# Patient Record
Sex: Male | Born: 1975 | Race: White | Hispanic: No | Marital: Single | Smoking: Former smoker
Health system: Southern US, Community
[De-identification: ages and names within clinical notes are randomized; demographics above are authoritative.]

## PROBLEM LIST (undated history)

## (undated) DIAGNOSIS — M869 Osteomyelitis, unspecified: Secondary | ICD-10-CM

## (undated) DIAGNOSIS — G4730 Sleep apnea, unspecified: Secondary | ICD-10-CM

## (undated) DIAGNOSIS — F17209 Nicotine dependence, unspecified, with unspecified nicotine-induced disorders: Secondary | ICD-10-CM

## (undated) DIAGNOSIS — E119 Type 2 diabetes mellitus without complications: Secondary | ICD-10-CM

## (undated) DIAGNOSIS — F319 Bipolar disorder, unspecified: Secondary | ICD-10-CM

## (undated) DIAGNOSIS — F324 Major depressive disorder, single episode, in partial remission: Secondary | ICD-10-CM

## (undated) DIAGNOSIS — I10 Essential (primary) hypertension: Secondary | ICD-10-CM

## (undated) DIAGNOSIS — L97419 Non-pressure chronic ulcer of right heel and midfoot with unspecified severity: Secondary | ICD-10-CM

## (undated) DIAGNOSIS — F419 Anxiety disorder, unspecified: Secondary | ICD-10-CM

## (undated) DIAGNOSIS — J439 Emphysema, unspecified: Secondary | ICD-10-CM

## (undated) DIAGNOSIS — E1142 Type 2 diabetes mellitus with diabetic polyneuropathy: Secondary | ICD-10-CM

---

## 2008-06-18 HISTORY — PX: DG GREAT TOE LEFT FOOT: HXRAD1656

## 2012-09-18 ENCOUNTER — Ambulatory Visit: Payer: Self-pay

## 2012-10-16 ENCOUNTER — Ambulatory Visit: Payer: Self-pay

## 2012-11-16 ENCOUNTER — Ambulatory Visit: Payer: Self-pay

## 2014-05-25 ENCOUNTER — Ambulatory Visit: Payer: Self-pay

## 2014-05-25 IMAGING — NM NUCLEAR MEDICINE WHOLE BODY BONE SCINTIGRAPHY
4 series · 14 of 14 positions shown · non-contrast
Comparison: None.

CLINICAL DATA: Hypercalcemia.

EXAM:
NUCLEAR MEDICINE WHOLE BODY BONE SCAN
TECHNIQUE: Whole body anterior and posterior images were obtained approximately
3 hours after intravenous injection of radiopharmaceutical.
RADIOPHARMACEUTICALS:  23.009 mCi [70] MDP

[Series 1000: statics · 2.40mm/px · 2 of 2 frames shown (1 of 2)]
[frame 1/2]
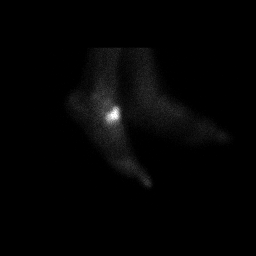
[frame 2/2]
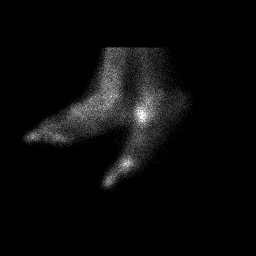

[Series 1000: 3 hr wholebody #1 head-mid femur · 2.40mm/px · 2 of 2 frames shown]
[frame 1/2]
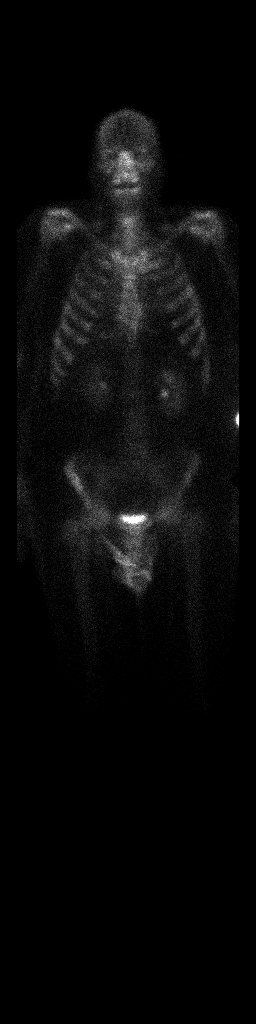
[frame 2/2]
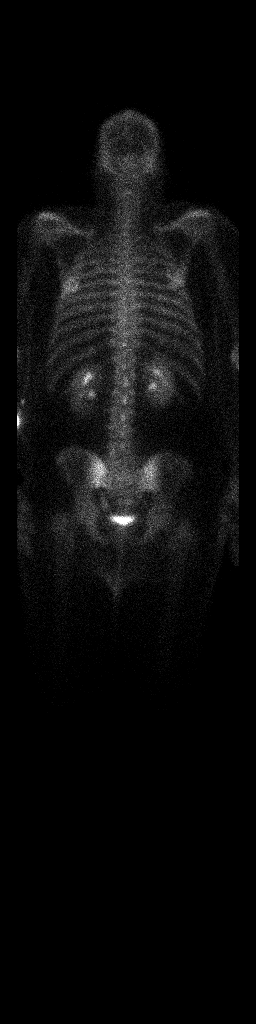

[Series 1000: statics · 2.40mm/px · 4 acquisitions, 8 frames shown (2 of 2)]
[im 1/4]
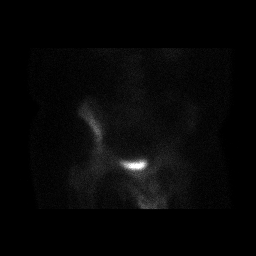
[im 1/4]
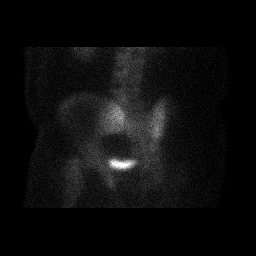
[im 2/4]
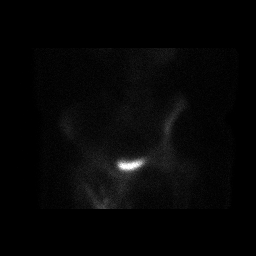
[im 2/4]
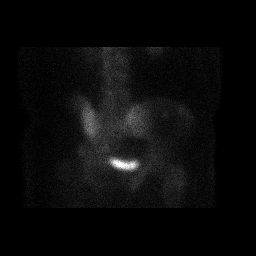
[im 3/4]
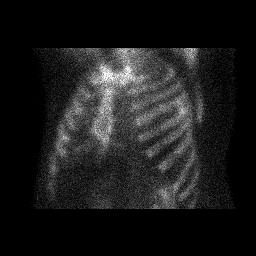
[im 3/4]
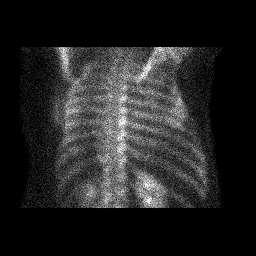
[im 4/4]
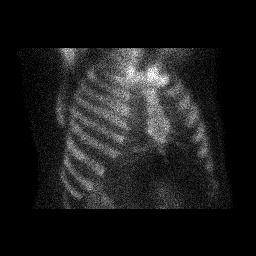
[im 4/4]
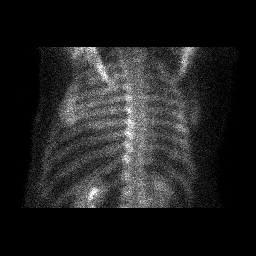

[Series 1000: 3 hr wholebody-#2 femur-feet · 2.40mm/px · 2 of 2 frames shown]
[frame 1/2]
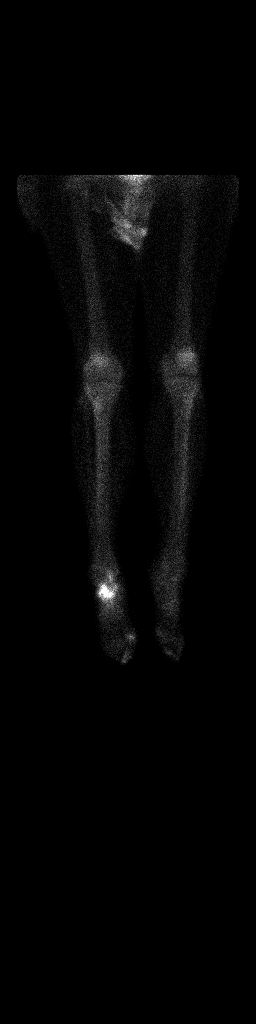
[frame 2/2]
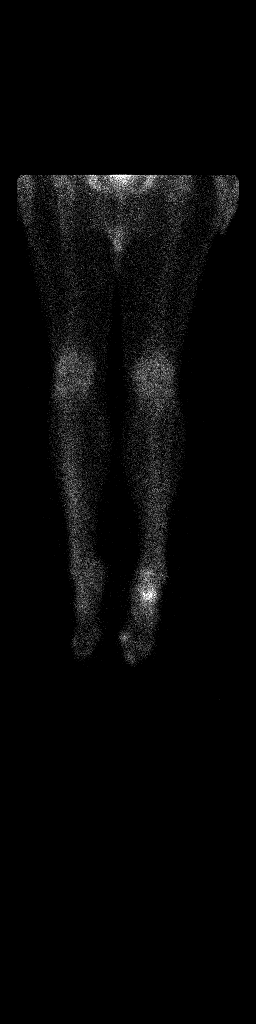

[14 of 14 positions shown; findings below may reference images not displayed]

FINDINGS: Large area of abnormal uptake is seen involving the lateral aspect
of the right midfoot. It is uncertain if this represents
degenerative change, or possibly acute abnormality such as
osteomyelitis. Small focus of abnormal uptake is also seen in the
region of the right first metatarsophalangeal joint. This most
likely represents degenerative change. No other areas of abnormal
uptake her noted.
IMPRESSION: Small focus of abnormal uptake is seen in the region of the right
first metatarsophalangeal joint most consistent with degenerative
change.

Larger area of abnormal uptake is seen involving the lateral aspect
of the right midfoot which may represent severe degenerative change,
but acute abnormality such as osteomyelitis cannot be excluded.
Clinical correlation and possibly MRI may be performed for further
evaluation.

## 2015-09-13 DIAGNOSIS — E6609 Other obesity due to excess calories: Secondary | ICD-10-CM | POA: Insufficient documentation

## 2015-09-13 DIAGNOSIS — E291 Testicular hypofunction: Secondary | ICD-10-CM | POA: Insufficient documentation

## 2016-04-06 DIAGNOSIS — E1142 Type 2 diabetes mellitus with diabetic polyneuropathy: Secondary | ICD-10-CM | POA: Insufficient documentation

## 2017-09-12 ENCOUNTER — Encounter: Payer: Self-pay | Admitting: Dietician

## 2017-09-12 ENCOUNTER — Encounter: Payer: Medicare Other | Attending: Internal Medicine | Admitting: Dietician

## 2017-09-12 VITALS — Ht 79.0 in | Wt 322.6 lb

## 2017-09-12 DIAGNOSIS — Z713 Dietary counseling and surveillance: Secondary | ICD-10-CM | POA: Diagnosis present

## 2017-09-12 DIAGNOSIS — E119 Type 2 diabetes mellitus without complications: Secondary | ICD-10-CM | POA: Diagnosis not present

## 2017-09-12 DIAGNOSIS — F419 Anxiety disorder, unspecified: Secondary | ICD-10-CM | POA: Insufficient documentation

## 2017-09-12 DIAGNOSIS — IMO0001 Reserved for inherently not codable concepts without codable children: Secondary | ICD-10-CM | POA: Insufficient documentation

## 2017-09-12 DIAGNOSIS — E7800 Pure hypercholesterolemia, unspecified: Secondary | ICD-10-CM | POA: Insufficient documentation

## 2017-09-12 DIAGNOSIS — I10 Essential (primary) hypertension: Secondary | ICD-10-CM | POA: Insufficient documentation

## 2017-09-12 DIAGNOSIS — F17200 Nicotine dependence, unspecified, uncomplicated: Secondary | ICD-10-CM | POA: Insufficient documentation

## 2017-09-12 DIAGNOSIS — F319 Bipolar disorder, unspecified: Secondary | ICD-10-CM | POA: Insufficient documentation

## 2017-09-12 DIAGNOSIS — Z6836 Body mass index (BMI) 36.0-36.9, adult: Secondary | ICD-10-CM

## 2017-09-12 DIAGNOSIS — F5101 Primary insomnia: Secondary | ICD-10-CM | POA: Insufficient documentation

## 2017-09-12 DIAGNOSIS — E559 Vitamin D deficiency, unspecified: Secondary | ICD-10-CM | POA: Insufficient documentation

## 2017-09-12 DIAGNOSIS — E669 Obesity, unspecified: Secondary | ICD-10-CM | POA: Diagnosis present

## 2017-09-12 DIAGNOSIS — Z794 Long term (current) use of insulin: Secondary | ICD-10-CM

## 2017-09-12 DIAGNOSIS — F3341 Major depressive disorder, recurrent, in partial remission: Secondary | ICD-10-CM | POA: Insufficient documentation

## 2017-09-12 DIAGNOSIS — E118 Type 2 diabetes mellitus with unspecified complications: Secondary | ICD-10-CM

## 2017-09-12 NOTE — Progress Notes (Signed)
Medical Nutrition Therapy: Visit start time: 1500  end time: 1545  Assessment:  Diagnosis: obesity, diabetes, HTN Past medical history: sleep apnea Psychosocial issues/ stress concerns: depression, bipolar disorder, chronic insomnia Preferred learning method:  . Auditory . Visual  Current weight: 322.6lbs  Height: 6'7" Medications, supplements: reconciled list in medical record  Progress and evaluation: BG testing before meals, reports HbA1C 7.5%, has been improving recently; Novolog dose recently increased before meals. He is not carb counting, Novolog dose is based on BG results. No recent diet changes, feels like having an updated diet plan will help him resume healthier habits. He does want to lose weight and begin regular exercise. Reports erratic eating schedule, some days sleeping through morning hours and eating late afternoon and nighttime meals.   Physical activity: no regular exercise; has gym membership but has not yet started.   Dietary Intake:  Usual eating pattern includes 2 meals and 0-1 snacks per day. Dining out frequency: 2-4 meals per week.  Breakfast: none Snack: none Lunch: 4-5pm depends on food availability; usually cooks meat, starch vegetables, or sometimes sandwich. 3/27-- frozen fish and chips; TGIF mozzarella sticks (not typical)  Snack: none Supper: 11pm; occasionally into next morning hours-- "close do dawn"; sometimes cooks, or leftovers from lunch. Last week made minestrone soup Snack: usually none Beverages: water, crystal light, occasionally coffee with sugar free creamer.  Nutrition Care Education: Topics covered: weight control, diabetes, hypertension Basic nutrition: basic food groups, appropriate nutrient balance, appropriate meal and snack schedule, general nutrition guidelines    Weight control: determined energy needs for weight loss at 1900-2000kcal daily; provided meal plan with 45%CHO, 25% protein, and 30% fat; discussed appropriate food  portions, benefit of eating at regular intervals, importance of low fat food choices, role of exercise on BG control and weight-- encouraged beginning with short duration and increasing as able. Diabetes: appropriate meal and snack schedule, appropriate carb intake and balance, with 45% CHO for patient's energy needs, this amounts to 4 servings or 60grams with each meal, based on 3 meals daily; advised high fiber, whole grain choices whenever possible; advised eating every 4-5 hours for BG and weight control; used plate method for meal planning instruction. Hypertension: identifying high sodium foods, identifying food sources of potassium, magnesium-- encouraged plenty of vegetables and fruits for both BP control and weight control, and to help with portion control of starchy foods.  Nutritional Diagnosis:  [place]-2.2 Altered nutrition-related laboratory As related to diabetes.  As evidenced by HbA1C of 7.5%, patient report. [place]-3.3 Overweight/obesity As related to excess calories and inactivity, medications.  As evidenced by BMI 36, patient report of large food portions, insomnia, no regular exercise.  Intervention: Instruction as noted above.   Set goals with direction from patient.   He is motivated to work on lifestyle changes.    No follow-up scheduled at this time, patient will schedule later as needed.  Education Materials given:  . Plate Planner with food lists . Sample meal pattern/ menus  Planning a Balanced Meal . Diabetes-Friendly Recipes . Goals/ instructions  Learner/ who was taught:  . Patient   Level of understanding: Marland Kitchen. Verbalizes/ demonstrates competency  Demonstrated degree of understanding via:   Teach back Learning barriers: . None  Willingness to learn/ readiness for change: . Acceptance, ready for change  Monitoring and Evaluation:  Dietary intake, exercise, BG control, BP control, and body weight      follow up: prn

## 2017-09-12 NOTE — Patient Instructions (Signed)
   Monitor portion control for foods at mealtimes, aim for about 4 servings or 60grams carbohydrate at each meal, + a protein food + low-carb veggies.   Work on a regular eating schedule: Eat a meal or snack every 4-5 hours while awake. Eat light meals/ snacks if eating during nighttime hours.   Begin some regular exercise, start with short periods of time such as 10-15 minutes, and increase as energy increases.

## 2021-05-26 ENCOUNTER — Inpatient Hospital Stay
Admission: EM | Admit: 2021-05-26 | Discharge: 2021-06-01 | DRG: 074 | Disposition: A | Discharge status: Rehabilitation Facility | Payer: Medicare Other | Attending: Internal Medicine | Admitting: Internal Medicine

## 2021-05-26 ENCOUNTER — Other Ambulatory Visit: Payer: Self-pay

## 2021-05-26 ENCOUNTER — Inpatient Hospital Stay: Payer: Medicare Other

## 2021-05-26 ENCOUNTER — Encounter: Payer: Self-pay | Admitting: Emergency Medicine

## 2021-05-26 DIAGNOSIS — E785 Hyperlipidemia, unspecified: Secondary | ICD-10-CM | POA: Diagnosis present

## 2021-05-26 DIAGNOSIS — E1142 Type 2 diabetes mellitus with diabetic polyneuropathy: Principal | ICD-10-CM | POA: Diagnosis present

## 2021-05-26 DIAGNOSIS — R2681 Unsteadiness on feet: Secondary | ICD-10-CM | POA: Diagnosis present

## 2021-05-26 DIAGNOSIS — Z794 Long term (current) use of insulin: Secondary | ICD-10-CM | POA: Diagnosis not present

## 2021-05-26 DIAGNOSIS — Z20822 Contact with and (suspected) exposure to covid-19: Secondary | ICD-10-CM | POA: Diagnosis present

## 2021-05-26 DIAGNOSIS — M21372 Foot drop, left foot: Secondary | ICD-10-CM | POA: Diagnosis present

## 2021-05-26 DIAGNOSIS — F3341 Major depressive disorder, recurrent, in partial remission: Secondary | ICD-10-CM | POA: Diagnosis present

## 2021-05-26 DIAGNOSIS — Y92009 Unspecified place in unspecified non-institutional (private) residence as the place of occurrence of the external cause: Secondary | ICD-10-CM | POA: Diagnosis not present

## 2021-05-26 DIAGNOSIS — Z79899 Other long term (current) drug therapy: Secondary | ICD-10-CM

## 2021-05-26 DIAGNOSIS — Z7984 Long term (current) use of oral hypoglycemic drugs: Secondary | ICD-10-CM

## 2021-05-26 DIAGNOSIS — Z6838 Body mass index (BMI) 38.0-38.9, adult: Secondary | ICD-10-CM

## 2021-05-26 DIAGNOSIS — W19XXXA Unspecified fall, initial encounter: Secondary | ICD-10-CM

## 2021-05-26 DIAGNOSIS — M48061 Spinal stenosis, lumbar region without neurogenic claudication: Secondary | ICD-10-CM | POA: Diagnosis present

## 2021-05-26 DIAGNOSIS — E1165 Type 2 diabetes mellitus with hyperglycemia: Secondary | ICD-10-CM | POA: Diagnosis present

## 2021-05-26 DIAGNOSIS — Z7982 Long term (current) use of aspirin: Secondary | ICD-10-CM

## 2021-05-26 DIAGNOSIS — F319 Bipolar disorder, unspecified: Secondary | ICD-10-CM | POA: Diagnosis present

## 2021-05-26 DIAGNOSIS — Z7985 Long-term (current) use of injectable non-insulin antidiabetic drugs: Secondary | ICD-10-CM | POA: Diagnosis not present

## 2021-05-26 DIAGNOSIS — R29898 Other symptoms and signs involving the musculoskeletal system: Secondary | ICD-10-CM | POA: Diagnosis present

## 2021-05-26 DIAGNOSIS — M21371 Foot drop, right foot: Secondary | ICD-10-CM | POA: Diagnosis present

## 2021-05-26 DIAGNOSIS — I10 Essential (primary) hypertension: Secondary | ICD-10-CM | POA: Diagnosis present

## 2021-05-26 DIAGNOSIS — E1149 Type 2 diabetes mellitus with other diabetic neurological complication: Secondary | ICD-10-CM

## 2021-05-26 DIAGNOSIS — Z87891 Personal history of nicotine dependence: Secondary | ICD-10-CM | POA: Diagnosis not present

## 2021-05-26 DIAGNOSIS — E669 Obesity, unspecified: Secondary | ICD-10-CM | POA: Diagnosis present

## 2021-05-26 HISTORY — DX: Non-pressure chronic ulcer of right heel and midfoot with unspecified severity: L97.419

## 2021-05-26 HISTORY — DX: Sleep apnea, unspecified: G47.30

## 2021-05-26 HISTORY — DX: Type 2 diabetes mellitus with diabetic polyneuropathy: E11.42

## 2021-05-26 HISTORY — DX: Morbid (severe) obesity due to excess calories: E66.01

## 2021-05-26 HISTORY — DX: Anxiety disorder, unspecified: F41.9

## 2021-05-26 HISTORY — DX: Osteomyelitis, unspecified: M86.9

## 2021-05-26 HISTORY — DX: Emphysema, unspecified: J43.9

## 2021-05-26 HISTORY — DX: Essential (primary) hypertension: I10

## 2021-05-26 HISTORY — DX: Bipolar disorder, unspecified: F31.9

## 2021-05-26 HISTORY — DX: Type 2 diabetes mellitus without complications: E11.9

## 2021-05-26 HISTORY — DX: Major depressive disorder, single episode, in partial remission: F32.4

## 2021-05-26 HISTORY — DX: Nicotine dependence, unspecified, with unspecified nicotine-induced disorders: F17.209

## 2021-05-26 LAB — URINALYSIS, ROUTINE W REFLEX MICROSCOPIC
Bilirubin Urine: NEGATIVE
Glucose, UA: NEGATIVE mg/dL
Hgb urine dipstick: NEGATIVE
Ketones, ur: NEGATIVE mg/dL
Leukocytes,Ua: NEGATIVE
Nitrite: NEGATIVE
Protein, ur: NEGATIVE mg/dL
Specific Gravity, Urine: 1.025 (ref 1.005–1.030)
pH: 6 (ref 5.0–8.0)

## 2021-05-26 LAB — BASIC METABOLIC PANEL
Anion gap: 7 (ref 5–15)
BUN: 17 mg/dL (ref 6–20)
CO2: 23 mmol/L (ref 22–32)
Calcium: 10.2 mg/dL (ref 8.9–10.3)
Chloride: 108 mmol/L (ref 98–111)
Creatinine, Ser: 1.04 mg/dL (ref 0.61–1.24)
GFR, Estimated: >60 mL/min (ref >60)
Glucose, Bld: 170 mg/dL — ABNORMAL HIGH (ref 70–99)
Potassium: 3.5 mmol/L (ref 3.5–5.1)
Sodium: 138 mmol/L (ref 135–145)

## 2021-05-26 LAB — RESP PANEL BY RT-PCR (FLU A&B, COVID) ARPGX2
Influenza A by PCR: NEGATIVE
Influenza B by PCR: NEGATIVE
SARS Coronavirus 2 by RT PCR: NEGATIVE

## 2021-05-26 LAB — CSF CELL COUNT WITH DIFFERENTIAL
Eosinophils, CSF: 1 %
Lymphs, CSF: 22 %
Monocyte-Macrophage-Spinal Fluid: 0 %
Other Cells, CSF: 0
RBC Count, CSF: 4053 /mm3 — ABNORMAL HIGH (ref 0–3)
Segmented Neutrophils-CSF: 77 %
Tube #: 1
WBC, CSF: 13 /mm3 (ref 0–5)

## 2021-05-26 LAB — CBC
HCT: 42.2 % (ref 39.0–52.0)
Hemoglobin: 13.6 g/dL (ref 13.0–17.0)
MCH: 27.6 pg (ref 26.0–34.0)
MCHC: 32.2 g/dL (ref 30.0–36.0)
MCV: 85.8 fL (ref 80.0–100.0)
Platelets: 328 10*3/uL (ref 150–400)
RBC: 4.92 MIL/uL (ref 4.22–5.81)
RDW: 13.5 % (ref 11.5–15.5)
WBC: 9.6 10*3/uL (ref 4.0–10.5)
nRBC: 0 % (ref 0.0–0.2)

## 2021-05-26 LAB — PROTEIN, CSF: Total  Protein, CSF: 287 mg/dL — ABNORMAL HIGH (ref 15–45)

## 2021-05-26 LAB — CBG MONITORING, ED
Glucose-Capillary: 142 mg/dL — ABNORMAL HIGH (ref 70–99)
Glucose-Capillary: 282 mg/dL — ABNORMAL HIGH (ref 70–99)
Glucose-Capillary: 319 mg/dL — ABNORMAL HIGH (ref 70–99)

## 2021-05-26 LAB — GLUCOSE, CAPILLARY: Glucose-Capillary: 328 mg/dL — ABNORMAL HIGH (ref 70–99)

## 2021-05-26 LAB — GLUCOSE, CSF: Glucose, CSF: 122 mg/dL — ABNORMAL HIGH (ref 40–70)

## 2021-05-26 LAB — LITHIUM LEVEL: Lithium Lvl: 0.52 mmol/L — ABNORMAL LOW (ref 0.60–1.20)

## 2021-05-26 LAB — TROPONIN I (HIGH SENSITIVITY)
Troponin I (High Sensitivity): 37 ng/L — ABNORMAL HIGH (ref <18)
Troponin I (High Sensitivity): 36 ng/L — ABNORMAL HIGH (ref <18)

## 2021-05-26 IMAGING — MR MR CERVICAL SPINE WO/W CM
5 of 8 series · 29 of 48 positions shown · IV contrast (gadavist)
Comparison: None.

CLINICAL DATA: Neuro deficit, acute, stroke suspected.  Weakness

EXAM:
MRI CERVICAL SPINE WITHOUT AND WITH CONTRAST
TECHNIQUE: Multiplanar and multiecho pulse sequences of the cervical spine, to
include the craniocervical junction and cervicothoracic junction,
were obtained without and with intravenous contrast.
CONTRAST:  10mL GADAVIST GADOBUTROL 1 MMOL/ML IV SOLN

[Series 9: T2 · sagittal · 3.0mm · 0.62mm/px · 4 of 15 slices shown (1 of 2)]
[im 1/15]
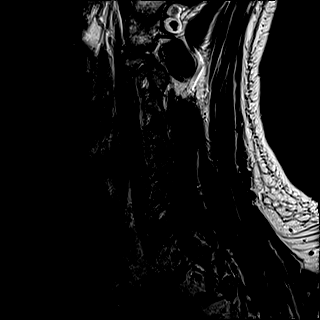
[im 5/15]
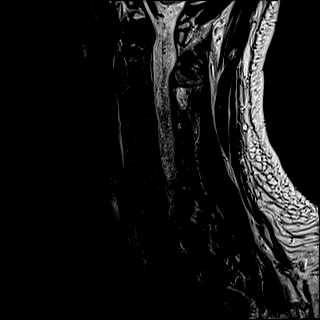
[im 10/15]
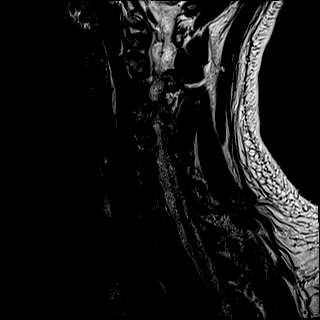
[im 15/15]
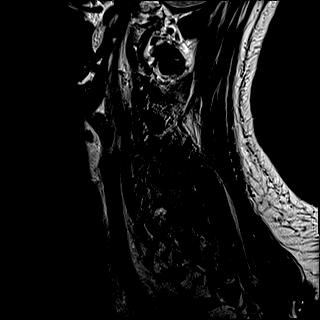

[Series 11: STIR · sagittal · 3.0mm · 0.62mm/px · 4 of 15 slices shown]
[im 1/15]
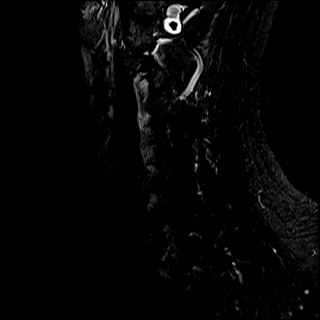
[im 5/15]
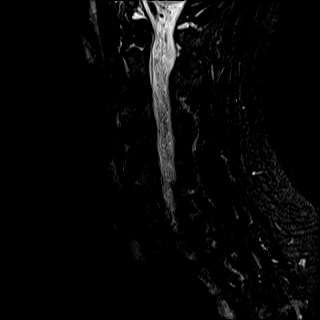
[im 10/15]
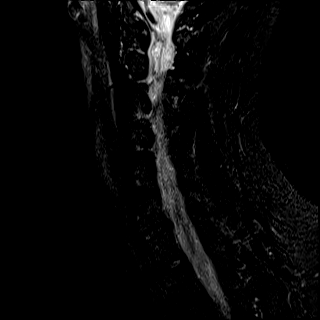
[im 15/15]
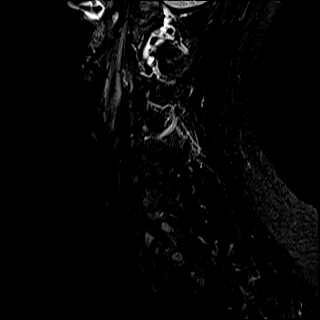

[Series 12: T2 · axial · 3.0mm · 0.70mm/px · z∈[-29,+71]mm · 8 of 31 slices shown (2 of 2)]
[im 1/31]
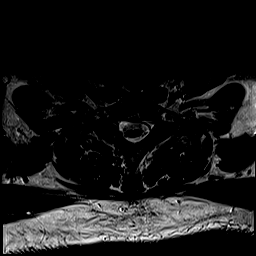
[im 5/31]
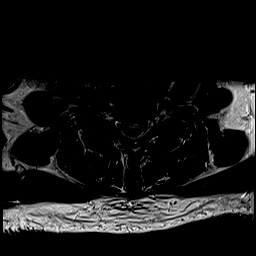
[im 9/31]
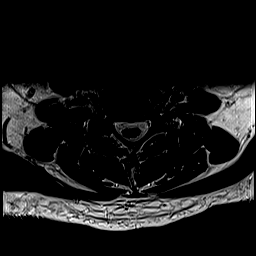
[im 13/31]
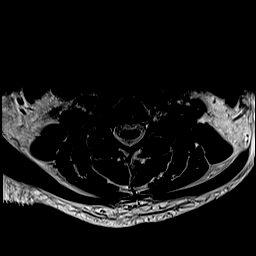
[im 18/31]
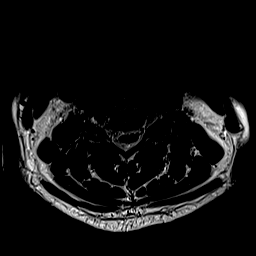
[im 22/31]
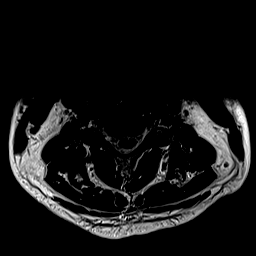
[im 26/31]
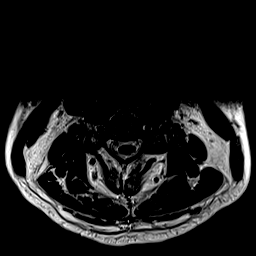
[im 31/31]
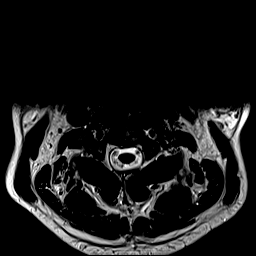

[Series 14: T1 · axial · non-contrast · 3.0mm · 0.35mm/px · z∈[-29,+71]mm · 8 of 31 slices shown]
[im 1/31]
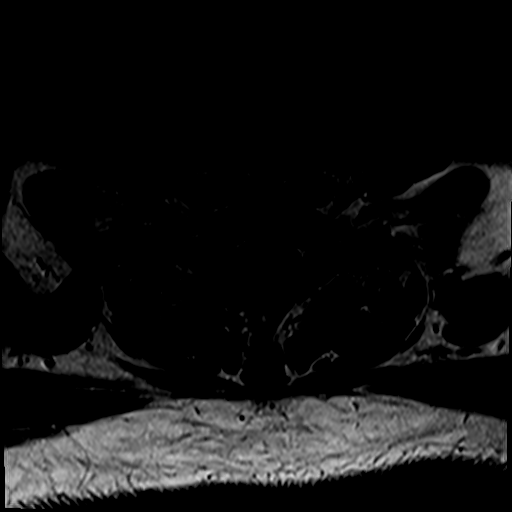
[im 5/31]
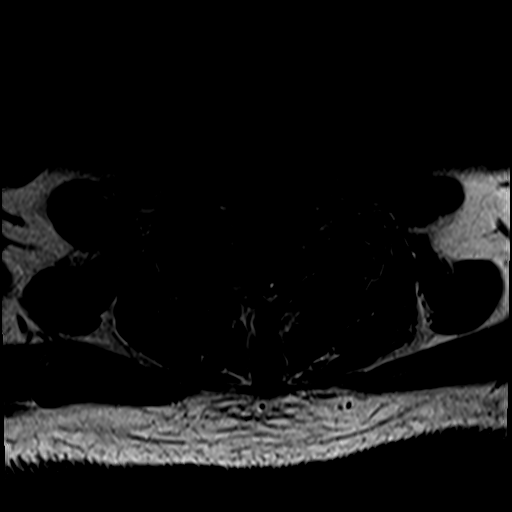
[im 9/31]
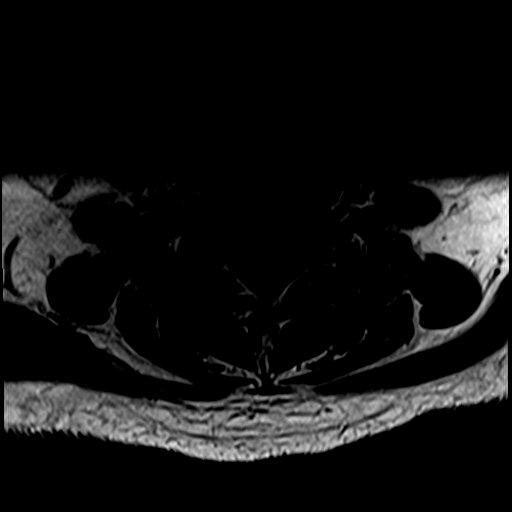
[im 13/31]
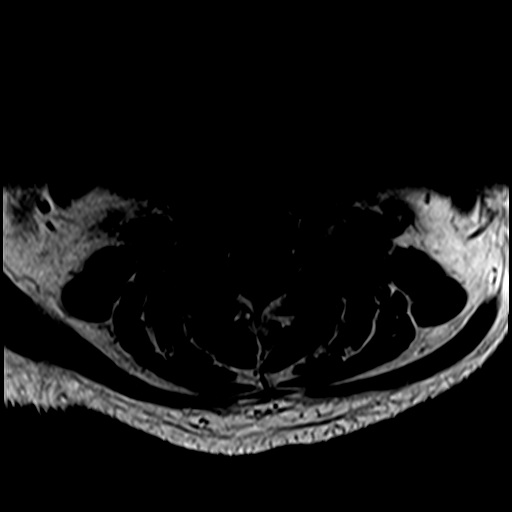
[im 18/31]
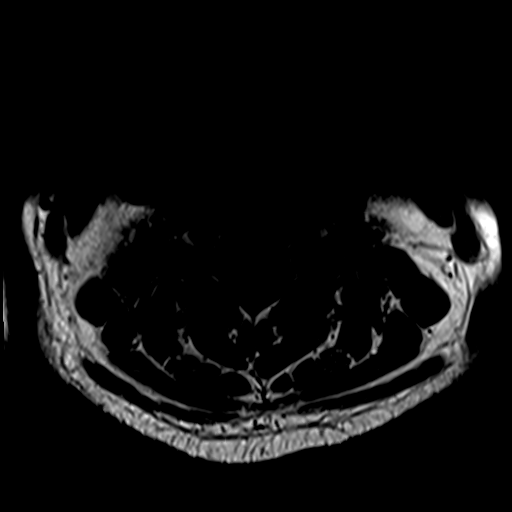
[im 22/31]
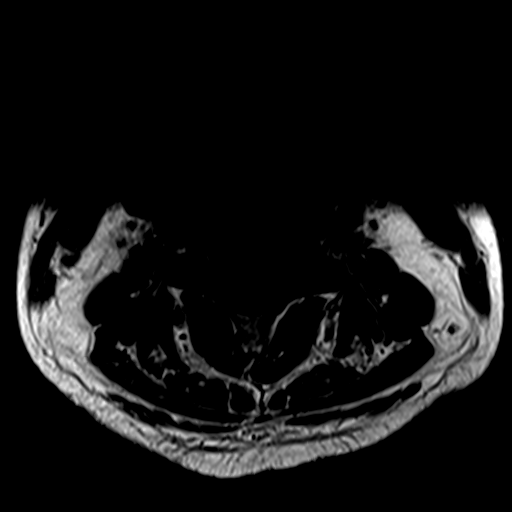
[im 26/31]
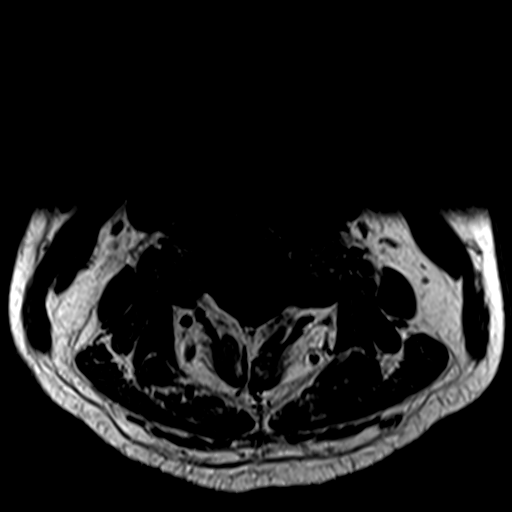
[im 31/31]
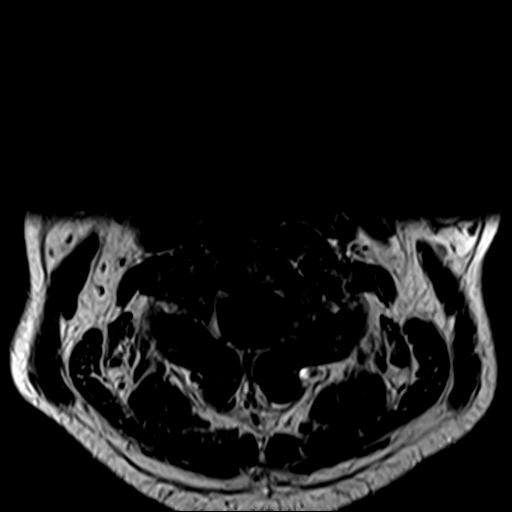

[Series 16: T1 post-contrast · axial · 3.0mm · 0.35mm/px · z∈[-29,+28]mm · 5 of 31 slices shown]
[im 1/31]
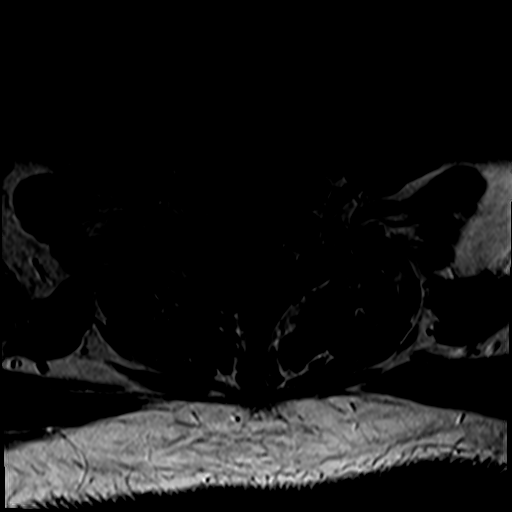
[im 5/31]
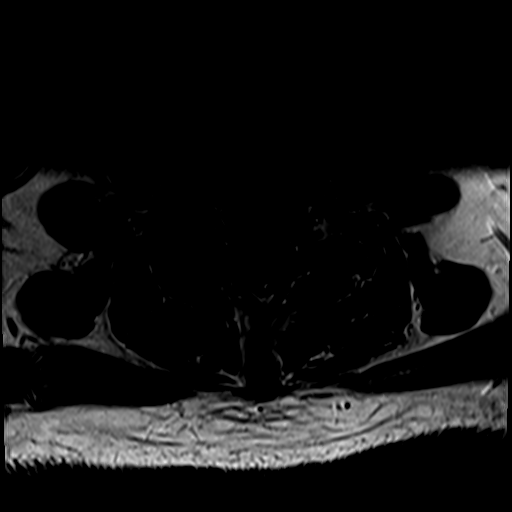
[im 9/31]
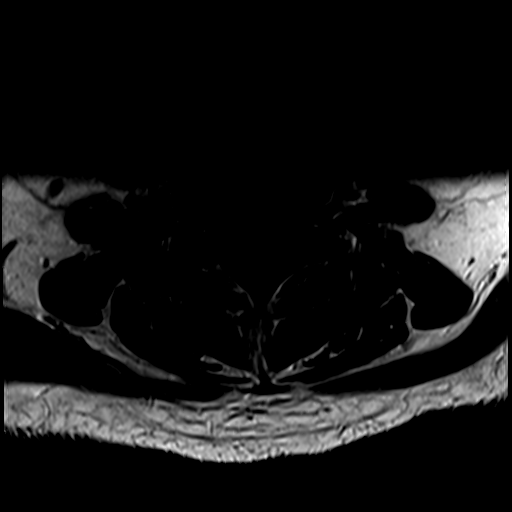
[im 13/31]
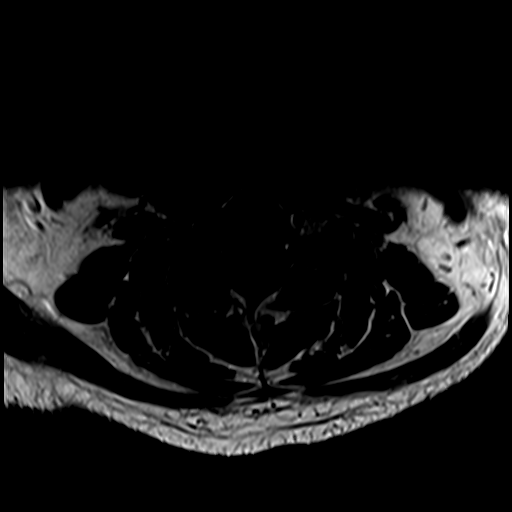
[im 18/31]
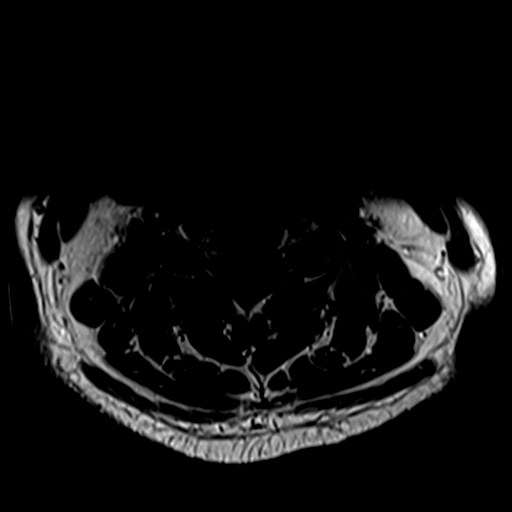

[29 of 48 positions shown; findings below may reference images not displayed]

FINDINGS: Technical Note: Despite efforts by the technologist and patient,
motion artifact is present on today's exam and could not be
eliminated. This reduces exam sensitivity and specificity.

Alignment: Physiologic.

Vertebrae: No fracture, evidence of discitis, or bone lesion.

Cord: Normal signal and morphology.

Posterior Fossa, vertebral arteries, paraspinal tissues: Negative.

Disc levels:

C2-C3: No significant disc protrusion, foraminal stenosis, or canal
stenosis.

C3-C4: Minimal disc bulge with right sided uncovertebral spurring.
No foraminal or canal stenosis.

C4-C5: No disc bulge. Minimal right uncovertebral spurring. No
foraminal or canal stenosis.

C5-C6: No disc bulge. Minimal right uncovertebral spurring. No
foraminal or canal stenosis.

C6-C7: Shallow central disc protrusion. No foraminal or canal
stenosis.

C7-T1: No significant disc protrusion, foraminal stenosis, or canal
stenosis.
IMPRESSION: 1. No acute findings.  No abnormal cord signal.
2. Early mild cervical spondylosis. No significant foraminal or
canal stenosis at any level.

## 2021-05-26 IMAGING — MR MR THORACIC SPINE WO/W CM
7 of 9 series · 31 of 48 positions shown · IV contrast (gadavist)
Comparison: None.

CLINICAL DATA: Trunk numbness or tingling weakness in both legs,
unable to walk

EXAM:
MRI THORACIC WITHOUT AND WITH CONTRAST
TECHNIQUE: Multiplanar and multiecho pulse sequences of the thoracic spine were
obtained without and with intravenous contrast.
CONTRAST:  10mL GADAVIST GADOBUTROL 1 MMOL/ML IV SOLN

[Series 18: T1 · sagittal · 6.0mm · 1.88mm/px · 1 of 8 slices shown (1 of 2)]
[im 1/8]
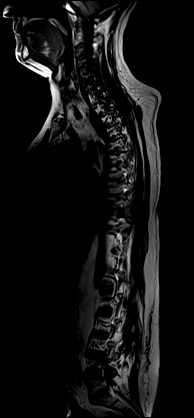

[Series 19: T2 · sagittal · 3.0mm · 1.33mm/px · 3 of 17 slices shown (1 of 2)]
[im 1/17]
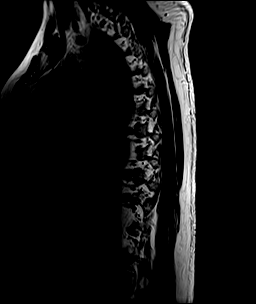
[im 9/17]
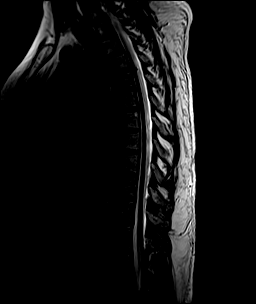
[im 17/17]
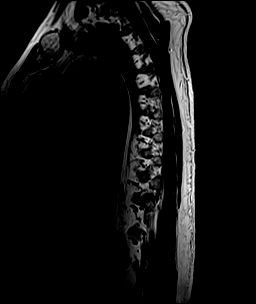

[Series 20: T1 · sagittal · 3.0mm · 1.33mm/px · 4 of 17 slices shown (2 of 2)]
[im 1/17]
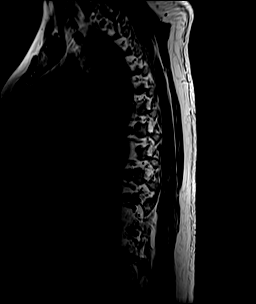
[im 6/17]
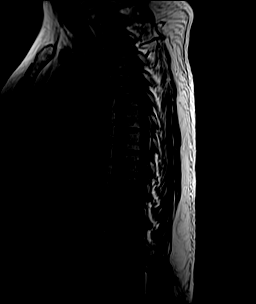
[im 11/17]
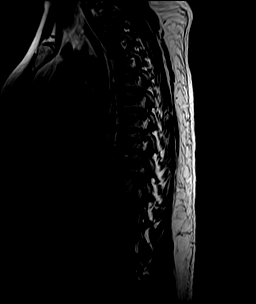
[im 17/17]
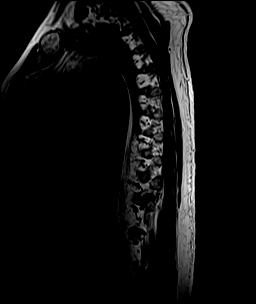

[Series 21: STIR · sagittal · 3.0mm · 0.66mm/px · 4 of 17 slices shown]
[im 1/17]
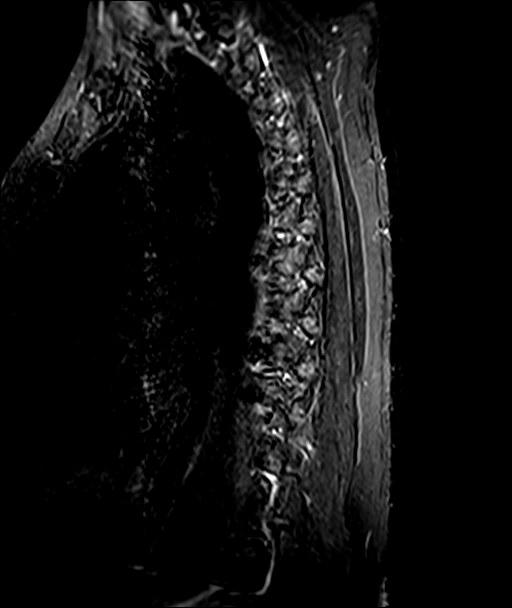
[im 6/17]
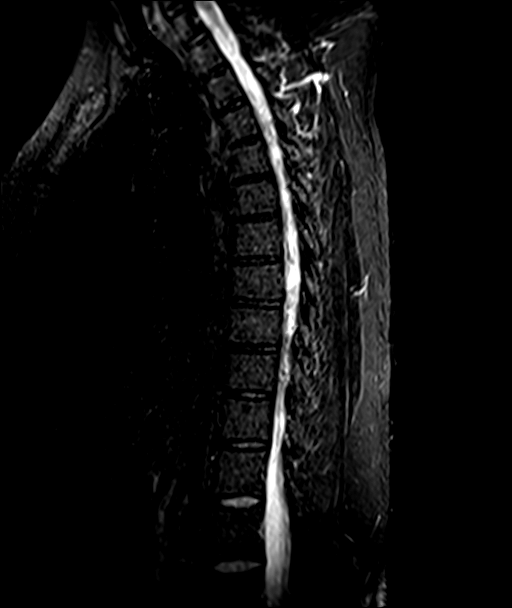
[im 11/17]
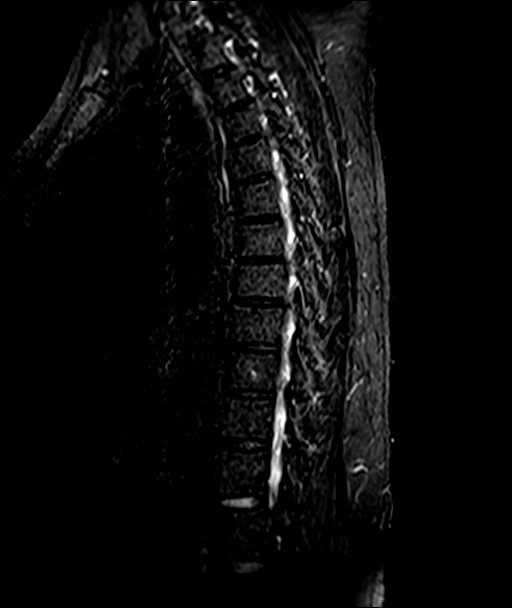
[im 17/17]
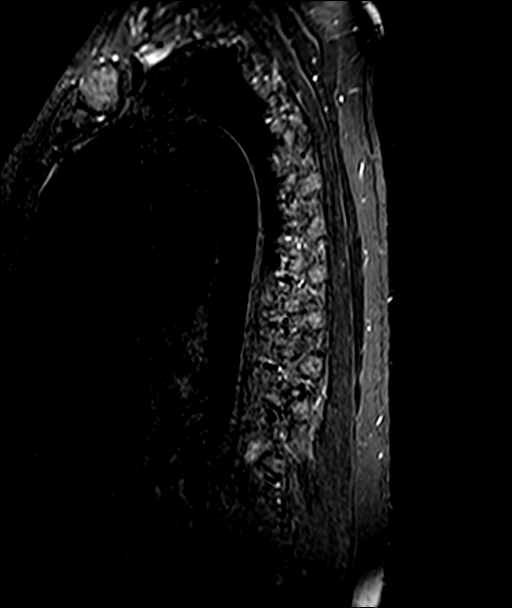

[Series 22: T2 · axial · 4.0mm · 0.59mm/px · z∈[-366,-47]mm · 8 of 39 slices shown (2 of 2)]
[im 1/39]
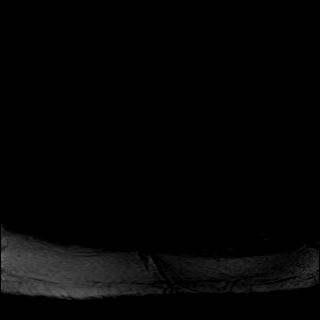
[im 6/39]
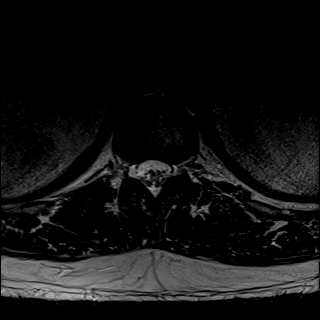
[im 11/39]
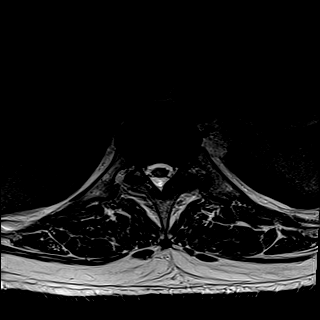
[im 17/39]
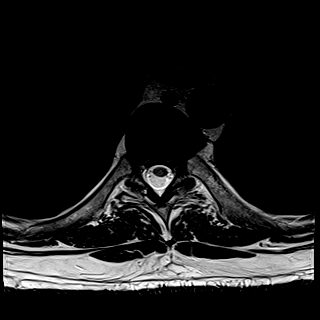
[im 22/39]
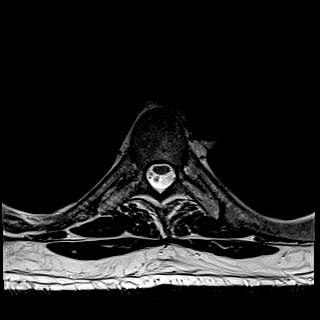
[im 28/39]
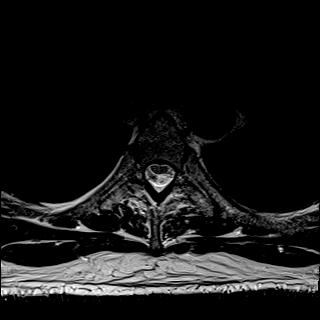
[im 33/39]
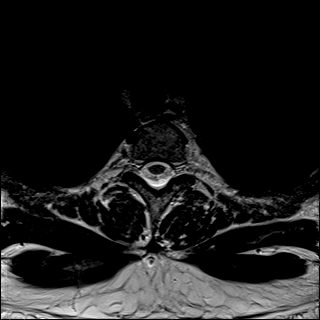
[im 39/39]
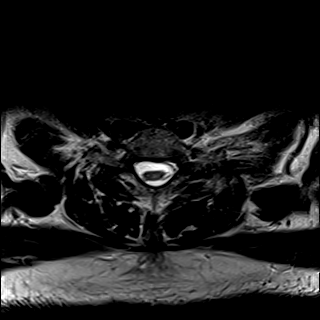

[Series 24: T1 post-contrast · axial · non-contrast · 4.0mm · 0.37mm/px · z∈[-366,-84]mm · 7 of 39 slices shown]
[im 1/39]
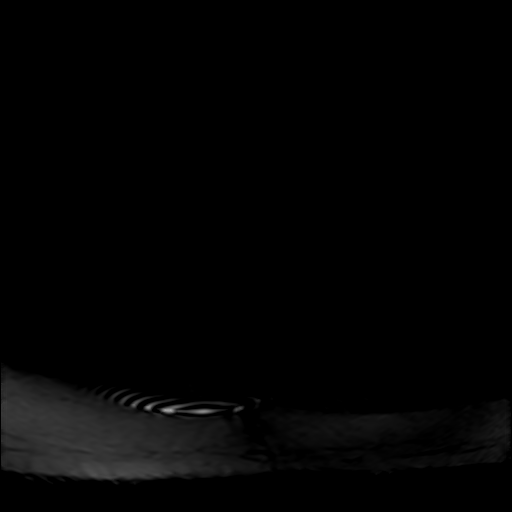
[im 6/39]
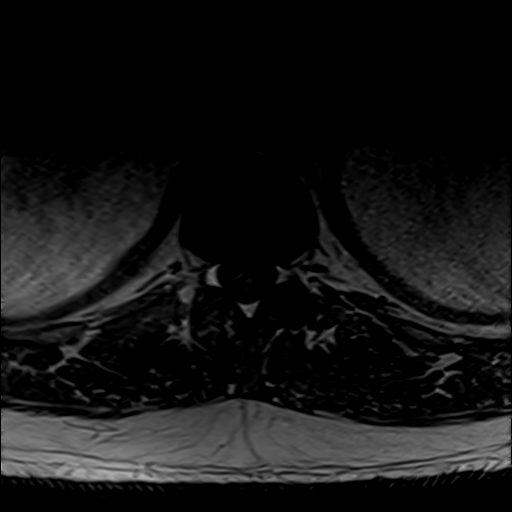
[im 11/39]
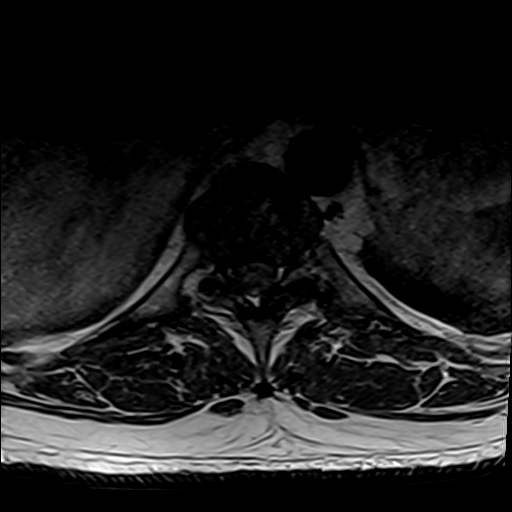
[im 17/39]
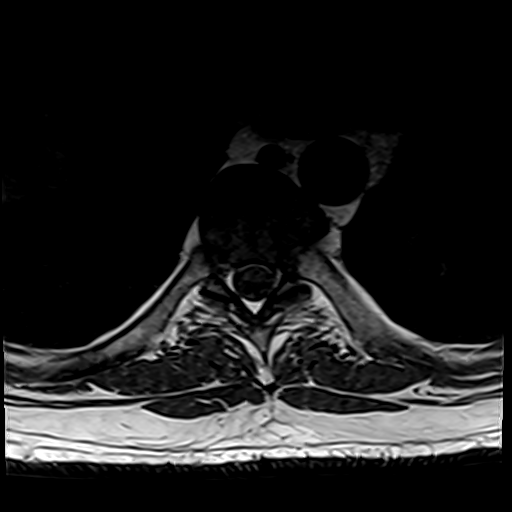
[im 22/39]
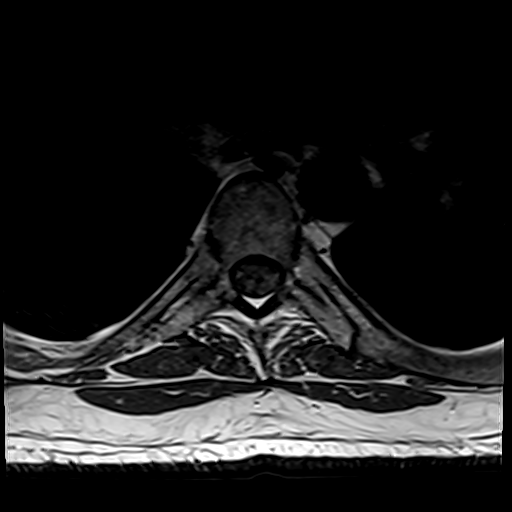
[im 28/39]
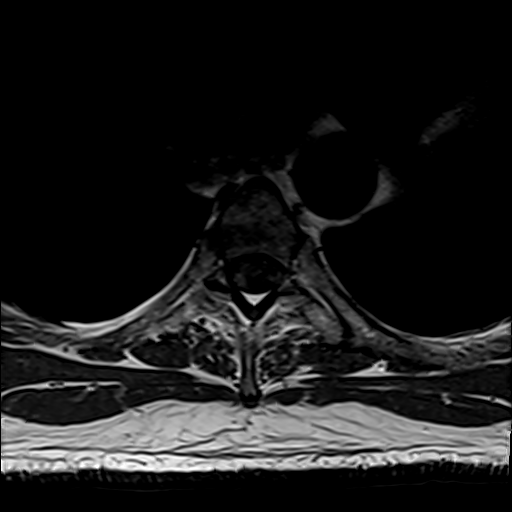
[im 33/39]
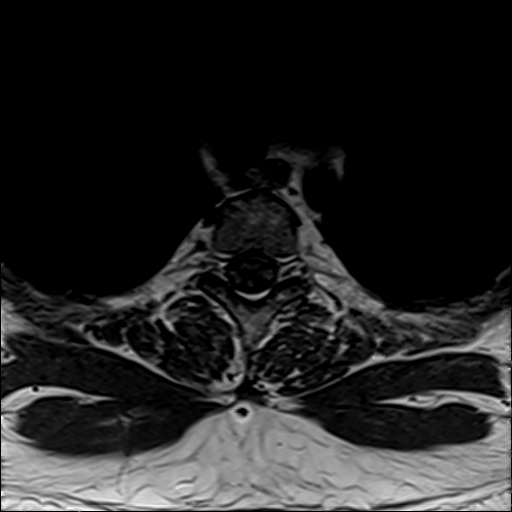

[Series 25: T1 fat-sat post-contrast · sagittal · 3.0mm · 1.33mm/px · 4 of 17 slices shown]
[im 1/17]
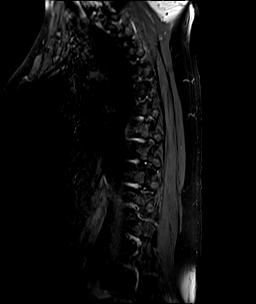
[im 6/17]
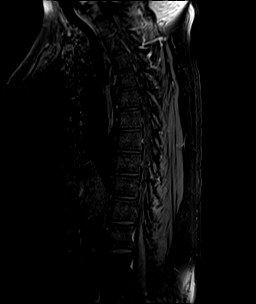
[im 11/17]
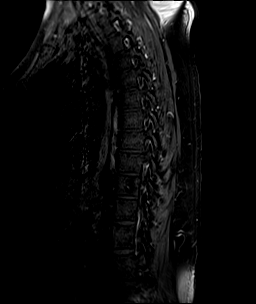
[im 17/17]
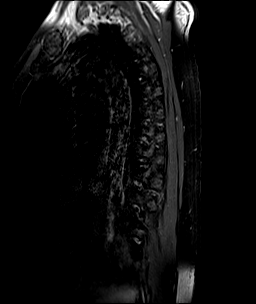

[31 of 48 positions shown; findings below may reference images not displayed]

FINDINGS: Alignment:  Physiologic.

Vertebrae: No fracture, evidence of discitis, or bone lesion.

Cord:  Normal signal and morphology.

Paraspinal and other soft tissues: Negative.

Disc levels:

T1-T2: No significant canal or neural foraminal narrowing.

T2-T3: No significant canal or neural foraminal narrowing.

T3-T4: No significant canal or neural foraminal narrowing.

T4-T5: No significant canal or neural foraminal narrowing.

T5-T6: Shallow central disc protrusion. No significant canal or
neural foraminal narrowing.

T6-T7: No significant canal or neural foraminal narrowing.

T7-T8: No significant canal or neural foraminal narrowing.

T8-T9: No disc protrusion. Mild bilateral facet arthropathy.
Borderline-mild bilateral foraminal stenosis. No canal stenosis.

T9-T10: No disc protrusion. Mild bilateral facet arthropathy. Mild
bilateral foraminal stenosis. No canal stenosis.

T10-T11: No disc protrusion. Mild bilateral facet arthropathy. Mild
bilateral foraminal stenosis. No canal stenosis.

T11-T12: No significant canal or neural foraminal narrowing.
IMPRESSION: 1. No acute findings. No evidence of cord compression or abnormal
cord signal.
2. Mild degenerative changes of the thoracic spine with mild
bilateral foraminal stenosis at T9-T10 and T10-T11. No canal
stenosis at any level.

## 2021-05-26 IMAGING — MR MR LUMBAR SPINE WO/W CM
6 of 7 series · 34 of 48 positions shown · IV contrast (gadavist)
Comparison: None.

CLINICAL DATA: Low back pain, cauda equina syndrome suspected
Myelopathy, acute, lumbar spine weakness in both legs, can not walk

EXAM:
MRI LUMBAR SPINE WITHOUT AND WITH CONTRAST
TECHNIQUE: Multiplanar and multiecho pulse sequences of the lumbar spine were
obtained without and with intravenous contrast.
CONTRAST:  10mL GADAVIST GADOBUTROL 1 MMOL/ML IV SOLN

[Series 9: T2 · sagittal · 4.0mm · 1.02mm/px · 5 of 19 slices shown (1 of 2)]
[im 1/19]
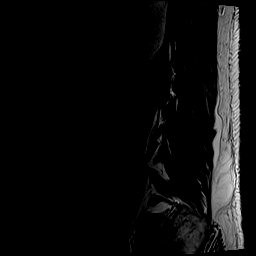
[im 5/19]
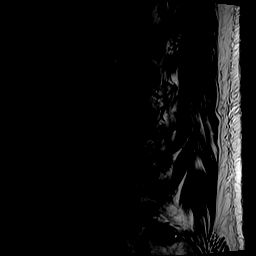
[im 10/19]
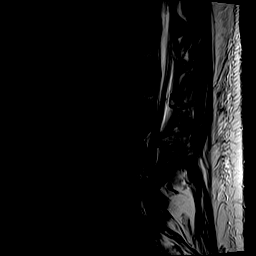
[im 14/19]
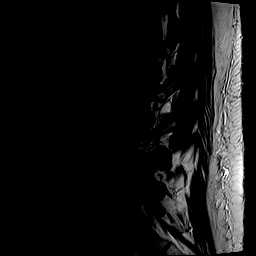
[im 19/19]
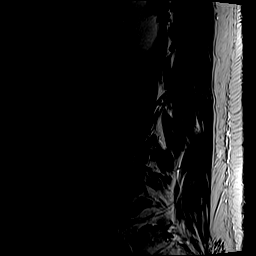

[Series 10: T1 · sagittal · 4.0mm · 1.02mm/px · 5 of 19 slices shown (1 of 2)]
[im 1/19]
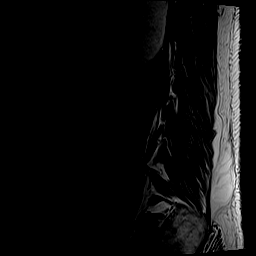
[im 5/19]
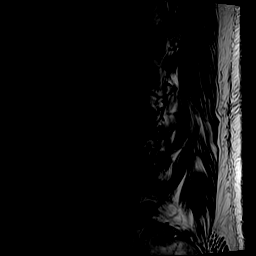
[im 10/19]
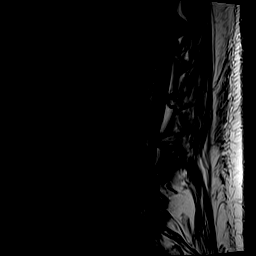
[im 14/19]
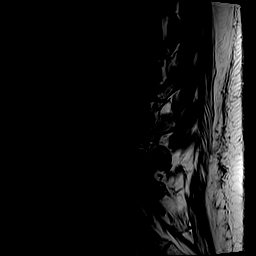
[im 19/19]
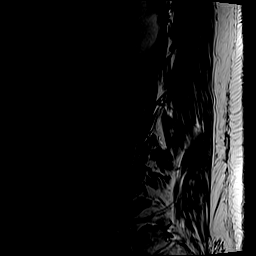

[Series 11: STIR · sagittal · 4.0mm · 0.51mm/px · 4 of 19 slices shown]
[im 1/19]
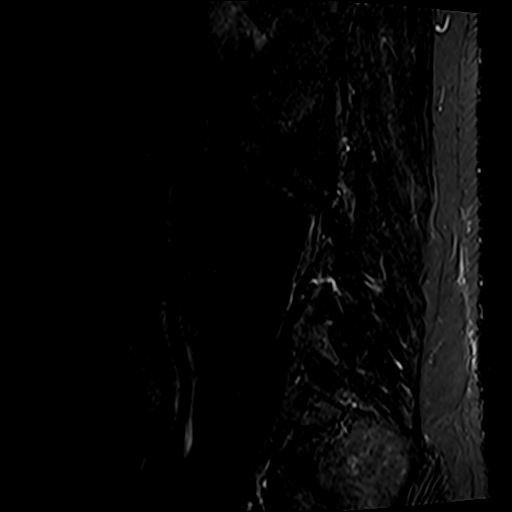
[im 7/19]
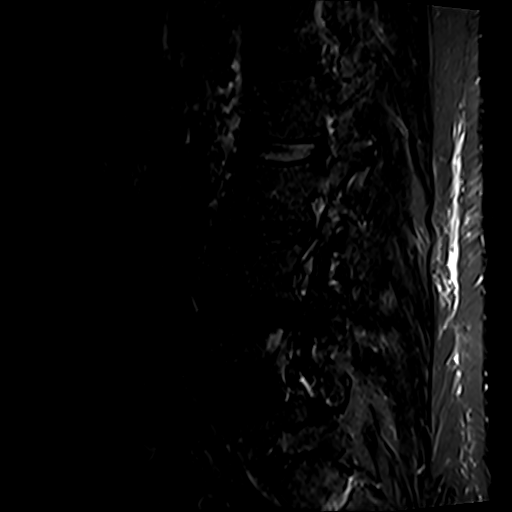
[im 13/19]
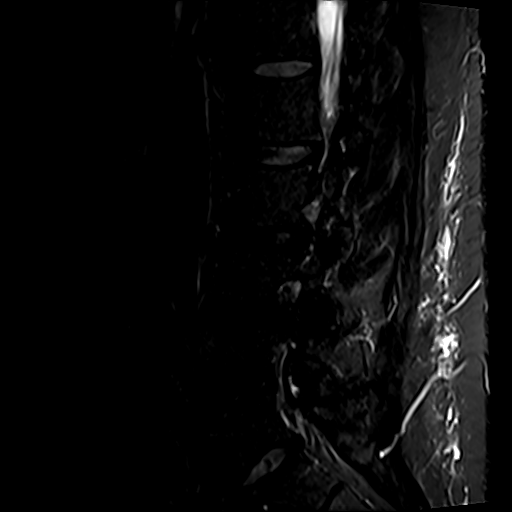
[im 19/19]
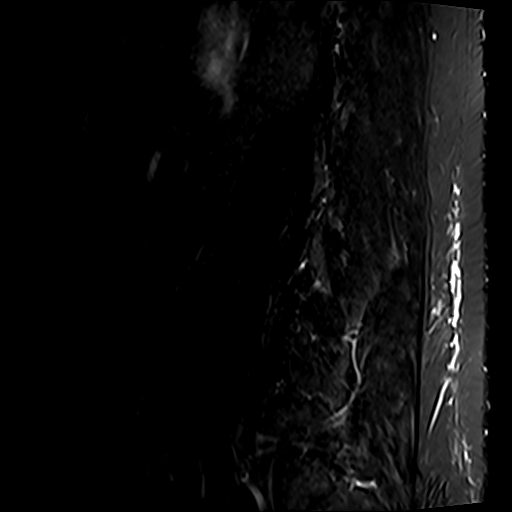

[Series 12: T2 · axial · 4.0mm · 0.78mm/px · z∈[-575,-337]mm · 8 of 44 slices shown (2 of 2)]
[im 1/44]
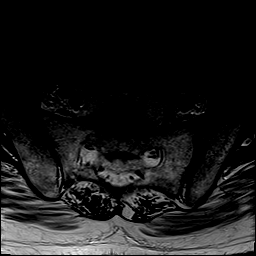
[im 5/44]
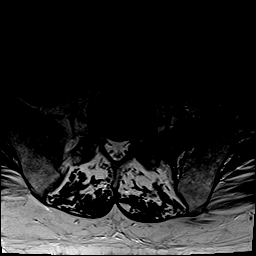
[im 15/44]
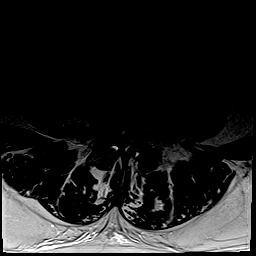
[im 20/44]
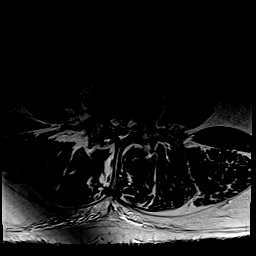
[im 24/44]
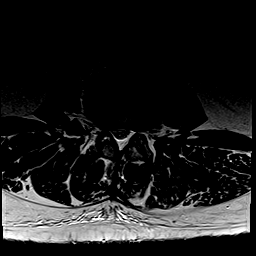
[im 29/44]
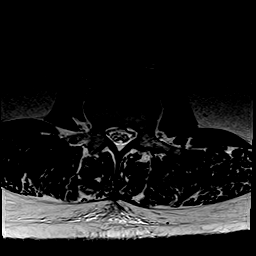
[im 39/44]
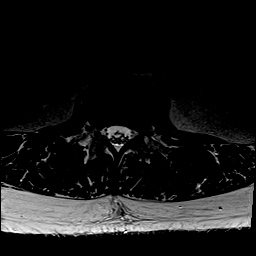
[im 44/44]
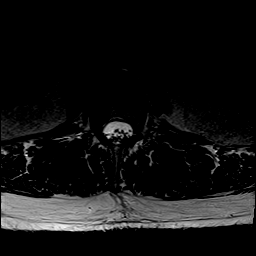

[Series 13: T1 · axial · 4.0mm · 0.39mm/px · z∈[-575,-337]mm · 8 of 44 slices shown (2 of 2)]
[im 1/44]
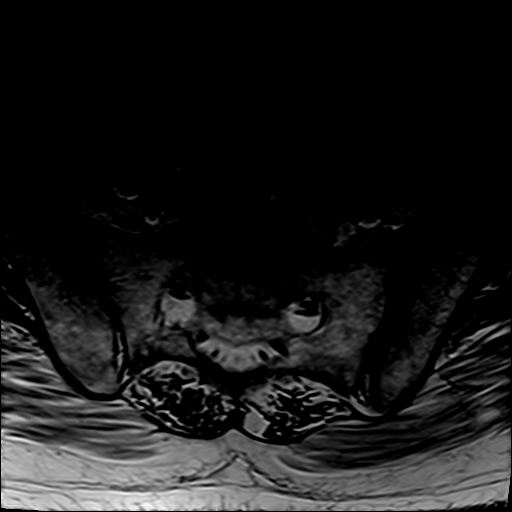
[im 5/44]
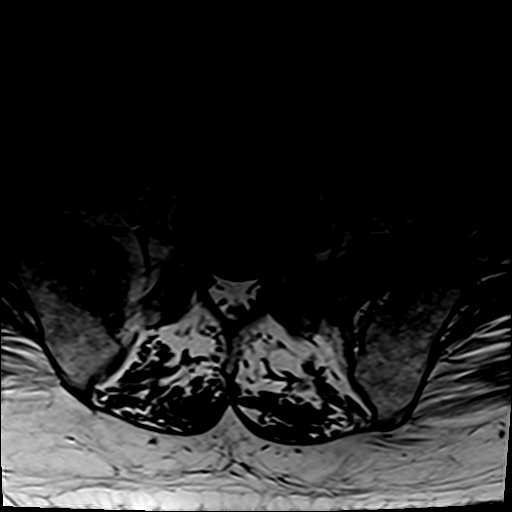
[im 15/44]
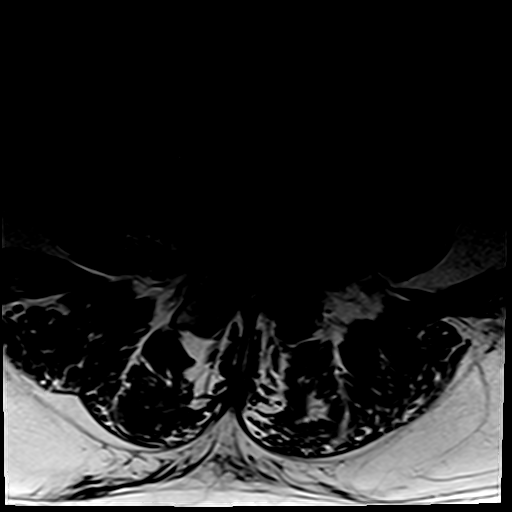
[im 20/44]
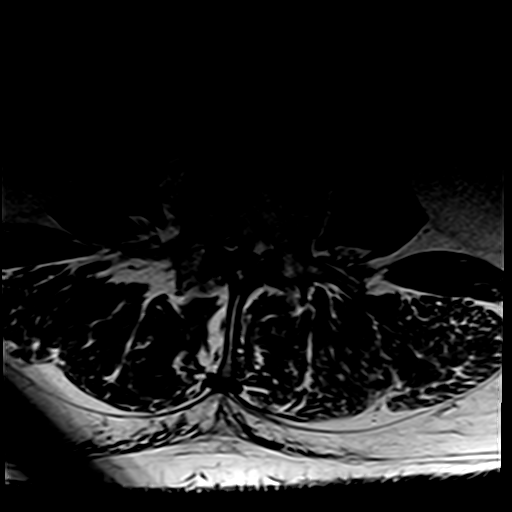
[im 24/44]
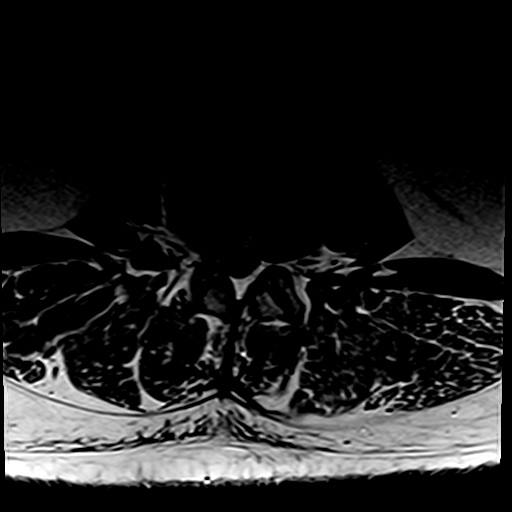
[im 29/44]
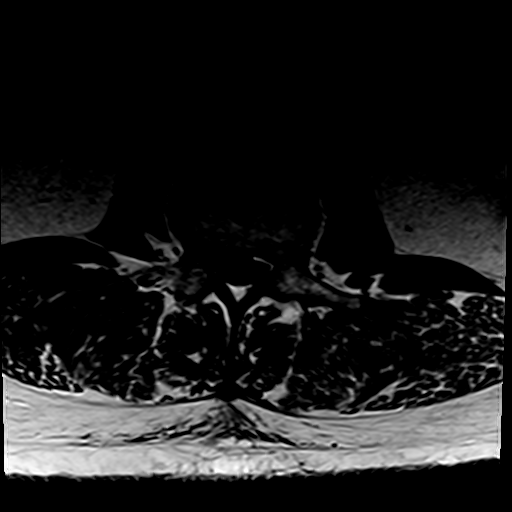
[im 39/44]
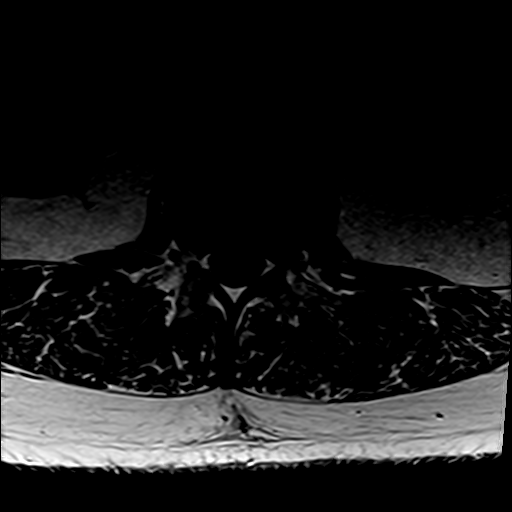
[im 44/44]
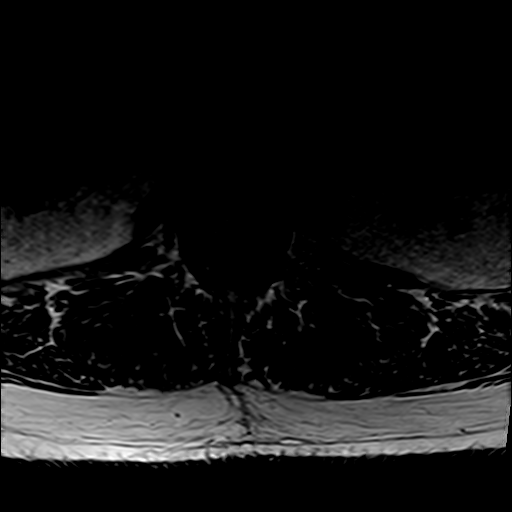

[Series 14: T1 fat-sat post-contrast · sagittal · 4.0mm · 1.02mm/px · 4 of 19 slices shown]
[im 1/19]
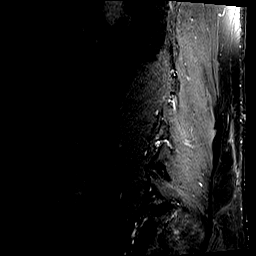
[im 7/19]
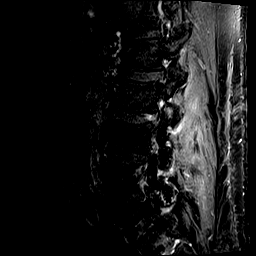
[im 13/19]
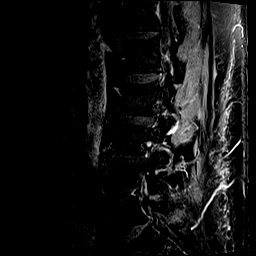
[im 19/19]
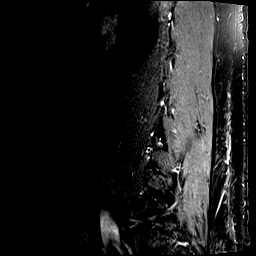

[34 of 48 positions shown; findings below may reference images not displayed]

FINDINGS: Segmentation:  Standard.

Alignment: Mild lumbar levocurvature. Slight retrolisthesis of L3 on
L4.

Vertebrae:  No fracture, evidence of discitis, or bone lesion.

Conus medullaris and cauda equina: Conus extends to the T12 level.
Conus and cauda equina appear normal.

Paraspinal and other soft tissues: Negative.

Disc levels:

T12-L1: Unremarkable disc. Mild bilateral facet hypertrophy. No
foraminal or canal stenosis.

L1-L2: Minimal annular disc bulge. Mild bilateral facet arthropathy.
No foraminal or canal stenosis.

L2-L3: Diffuse disc bulge with right foraminal disc protrusion. Mild
bilateral facet arthropathy. Moderate canal stenosis. No significant
foraminal stenosis.

L3-L4: Diffuse disc bulge with central disc protrusion. Moderate
bilateral facet arthropathy. Severe canal stenosis with mild
bilateral foraminal stenosis.

L4-L5: Diffuse disc bulge, eccentric to the left. Moderate bilateral
facet arthropathy. Findings result in mild-to-moderate canal
stenosis with moderate-severe left foraminal stenosis.

L5-S1: Mild central disc protrusion. Moderate bilateral facet
hypertrophy. No foraminal or canal stenosis.
IMPRESSION: 1. Multilevel lumbar spondylosis, as detailed above.
2. Severe canal stenosis at L3-L4.
3. Moderate canal stenosis at L2-L3.
4. Mild-to-moderate canal stenosis with moderate-severe left
foraminal stenosis at L4-L5.

## 2021-05-26 IMAGING — CR DG SPINAL PUNCT LUMBAR DIAG WITH FL CT GUIDANCE
4 series · 4 of 4 positions shown · non-contrast
Comparison: None

CLINICAL DATA: Weakness of both legs

EXAM:
DIAGNOSTIC LUMBAR PUNCTURE UNDER FLUOROSCOPIC GUIDANCE

[x lumbar spine lat (1 of 4)]
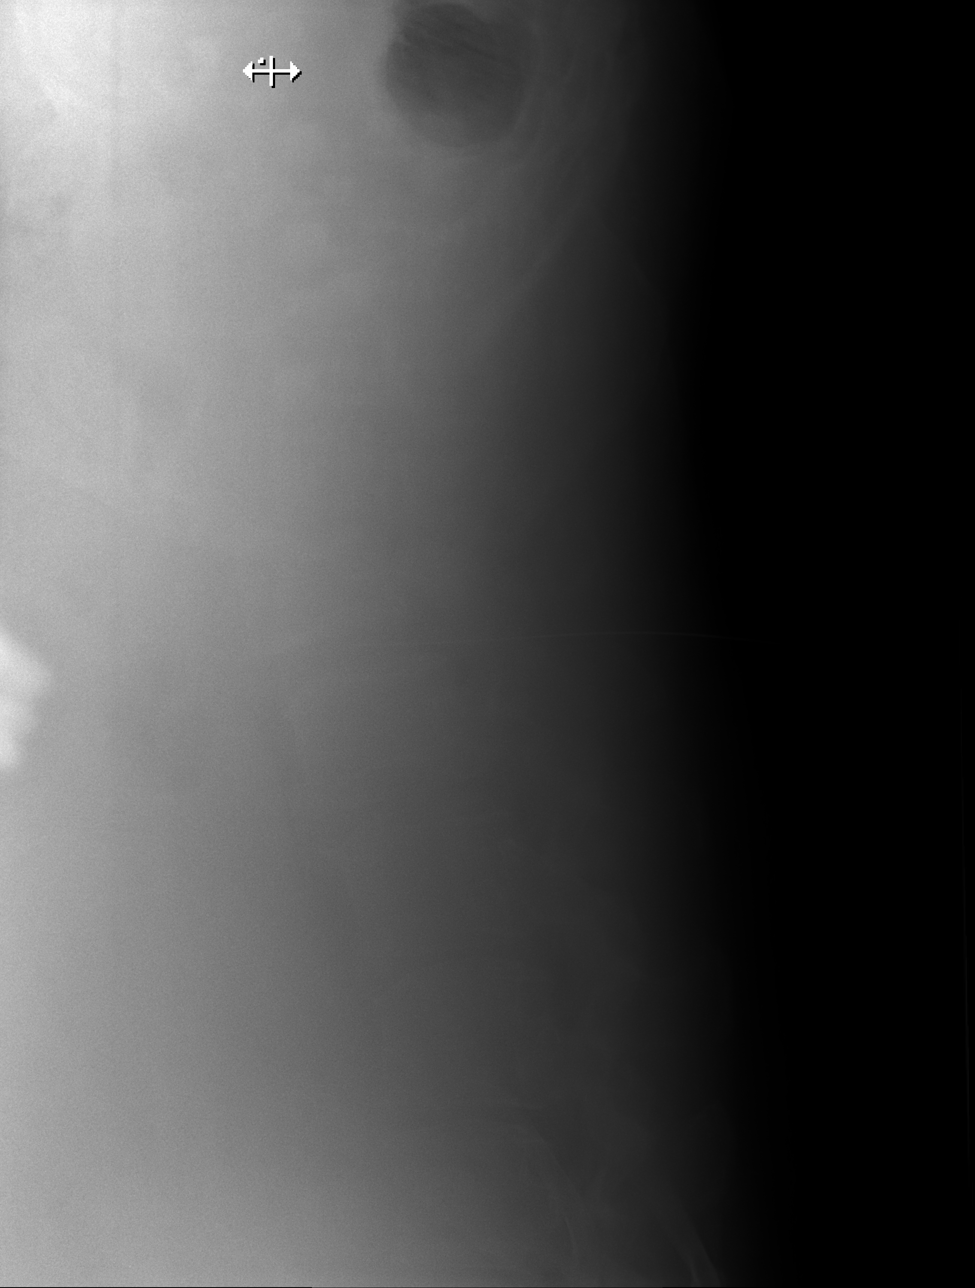

[x lumbar spine lat (2 of 4)]
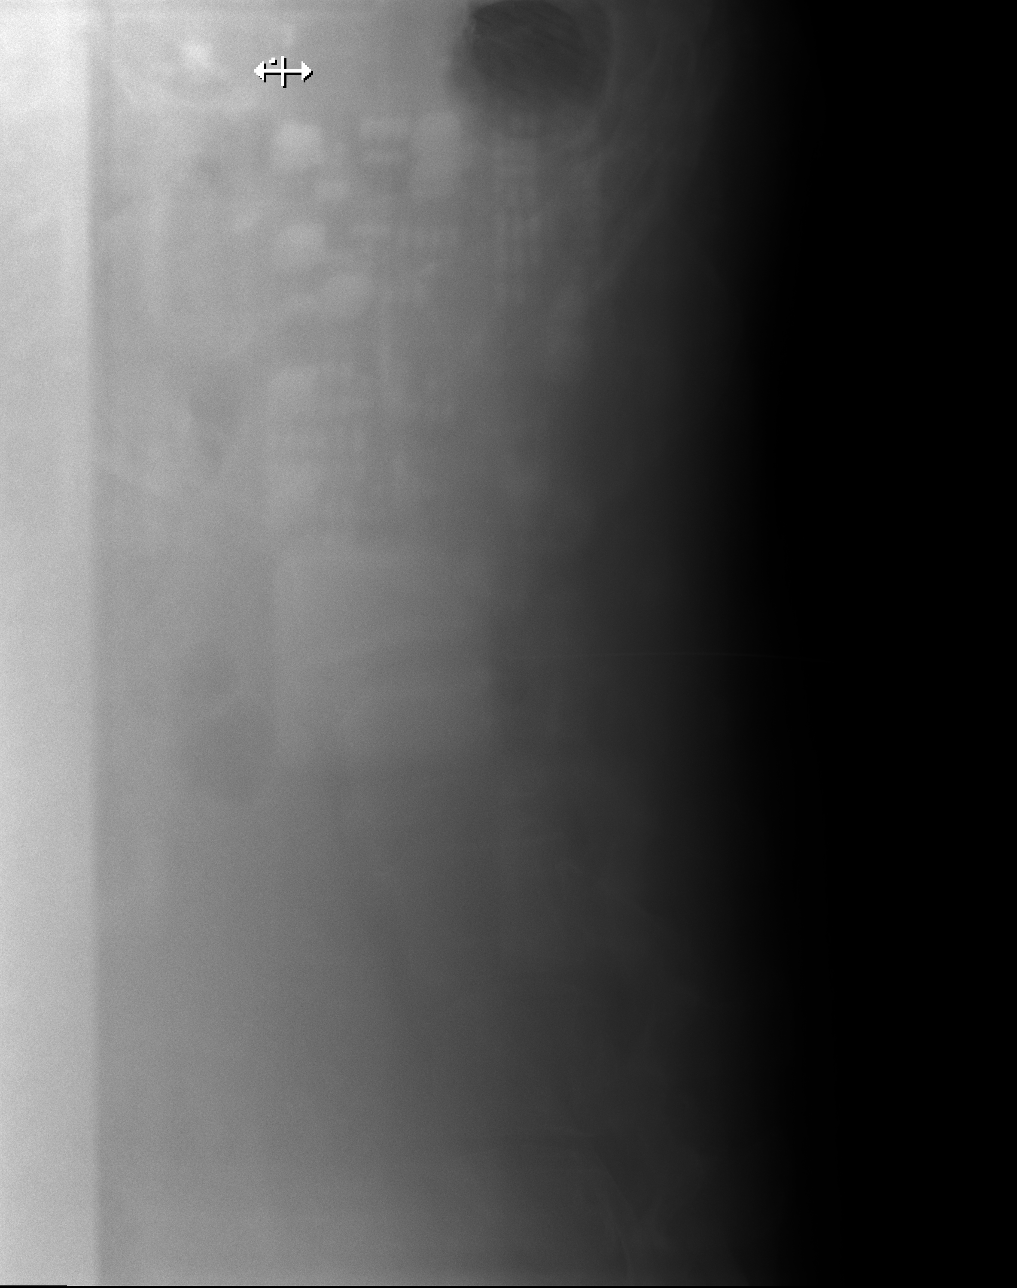

[x lumbar spine lat (3 of 4)]
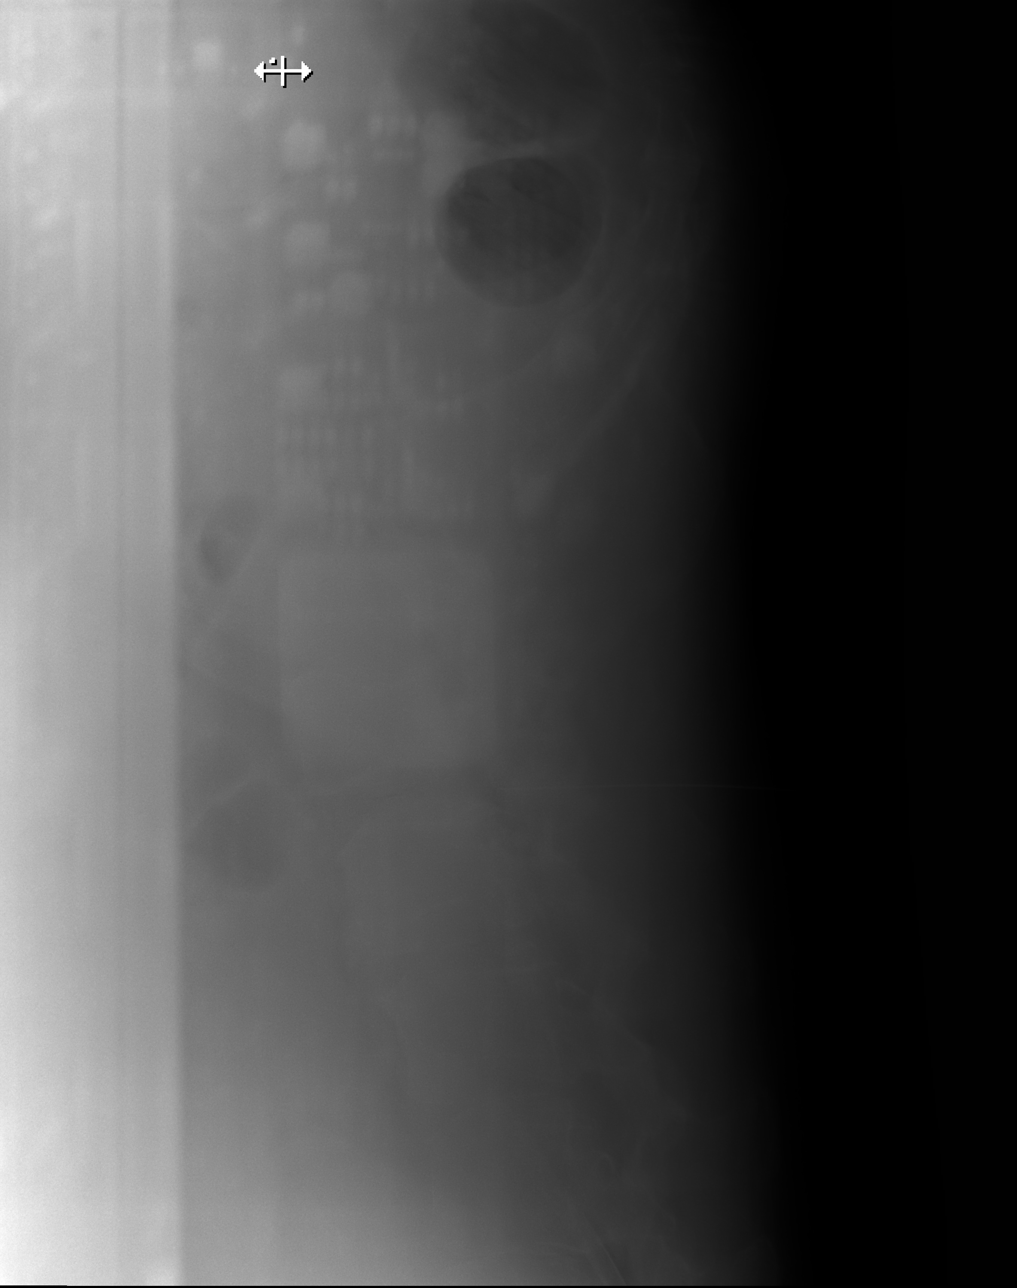

[x lumbar spine lat (4 of 4)]
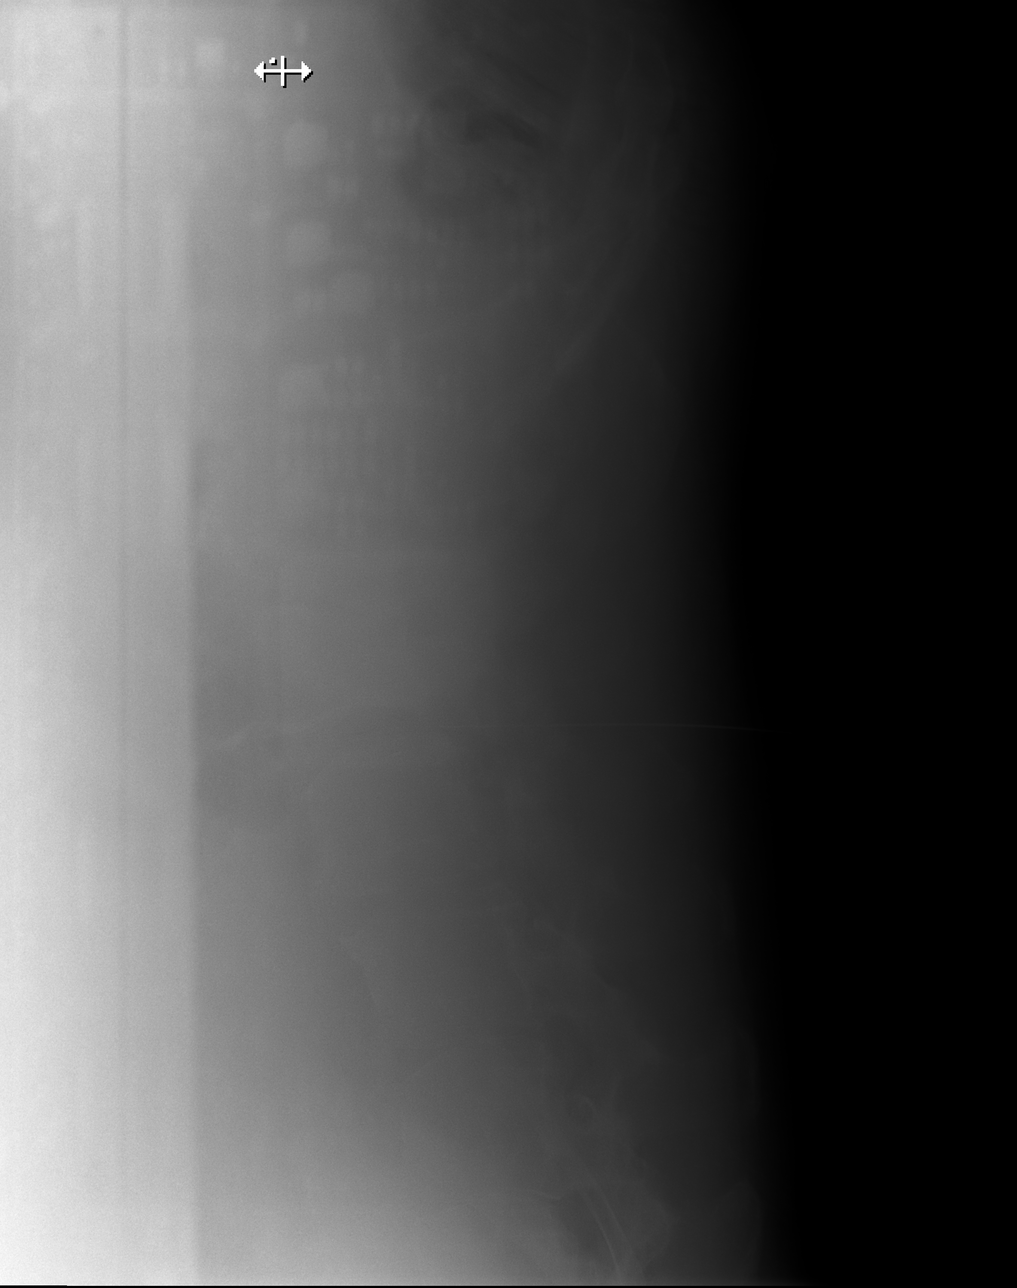

[4 of 4 positions shown; findings below may reference images not displayed]

FLUOROSCOPY TIME:  Fluoroscopy Time:  30 seconds

Radiation Exposure Index (if provided by the fluoroscopic device):
11.2 mGy

PROCEDURE:
Informed consent was obtained from the patient prior to the
procedure, including potential complications of headache, allergy,
and pain. With the patient prone, the lower back was prepped with
Betadine. 1% Lidocaine was used for local anesthesia. Lumbar
puncture was initially attempted at the L2-L3 level using a 22 gauge
needle with return of minimal serosanguineous fluid. Repeat attempt
at L2-L3 level resulted in same outcome. Puncture was then attempted
at the L3-L4 level. Suspicion flow could not be obtained despite
repositioning at this level. Initial lateral radiograph demonstrated
needle positioning within the disc. Needle was retracted and
subsequent radiograph demonstrated needle in expected position of
spinal canal. Again, there was minimal serosanguineous fluid.
Obtained scant CSF was sent for laboratory studies. The patient
tolerated the procedure well and there were no apparent
complications.
IMPRESSION: Unsuccessful fluoroscopic guided lumbar puncture. Only scant
serosanguineous CSF was obtained.

## 2021-05-26 MED ORDER — LIDOCAINE HCL (PF) 1 % IJ SOLN
10.0000 mL | Freq: Once | INTRAMUSCULAR | Status: AC
  Administered 2021-05-26: 5 mL via INTRADERMAL

## 2021-05-26 MED ORDER — SODIUM CHLORIDE 0.9% FLUSH
3.0000 mL | Freq: Two times a day (BID) | INTRAVENOUS | Status: DC
  Administered 2021-05-26 – 2021-06-01 (×13): 3 mL via INTRAVENOUS

## 2021-05-26 MED ORDER — LITHIUM CARBONATE ER 300 MG PO TBCR
300.0000 mg | EXTENDED_RELEASE_TABLET | Freq: Two times a day (BID) | ORAL | Status: DC
  Administered 2021-05-26 – 2021-06-01 (×13): 300 mg via ORAL
  Filled 2021-05-26 (×15): qty 1

## 2021-05-26 MED ORDER — GADOBUTROL 1 MMOL/ML IV SOLN
10.0000 mL | Freq: Once | INTRAVENOUS | Status: AC | PRN
  Administered 2021-05-26: 10 mL via INTRAVENOUS
  Filled 2021-05-26: qty 10

## 2021-05-26 MED ORDER — SODIUM CHLORIDE 0.9 % IV SOLN: 250.0000 mL | INTRAVENOUS | Status: DC | PRN

## 2021-05-26 MED ORDER — CARIPRAZINE HCL 3 MG PO CAPS
3.0000 mg | ORAL_CAPSULE | Freq: Every day | ORAL | Status: DC
  Administered 2021-05-26: 3 mg via ORAL
  Filled 2021-05-26 (×3): qty 1

## 2021-05-26 MED ORDER — ACETAMINOPHEN 325 MG PO TABS
650.0000 mg | ORAL_TABLET | Freq: Four times a day (QID) | ORAL | Status: DC | PRN
  Administered 2021-05-28 – 2021-05-29 (×2): 650 mg via ORAL
  Filled 2021-05-26 (×2): qty 2

## 2021-05-26 MED ORDER — ATORVASTATIN CALCIUM 20 MG PO TABS
40.0000 mg | ORAL_TABLET | Freq: Every day | ORAL | Status: DC
  Administered 2021-05-26 – 2021-05-31 (×6): 40 mg via ORAL
  Filled 2021-05-26: qty 0.5
  Filled 2021-05-26: qty 2
  Filled 2021-05-26 (×2): qty 1
  Filled 2021-05-26 (×3): qty 2
  Filled 2021-05-26 (×2): qty 0.5

## 2021-05-26 MED ORDER — INSULIN ASPART 100 UNIT/ML IJ SOLN
30.0000 [IU] | Freq: Once | INTRAMUSCULAR | Status: AC
  Administered 2021-05-26: 30 [IU] via SUBCUTANEOUS
  Filled 2021-05-26: qty 1

## 2021-05-26 MED ORDER — ONDANSETRON HCL 4 MG/2ML IJ SOLN
4.0000 mg | Freq: Four times a day (QID) | INTRAMUSCULAR | Status: DC | PRN
  Administered 2021-05-29: 4 mg via INTRAVENOUS
  Filled 2021-05-26: qty 2

## 2021-05-26 MED ORDER — INSULIN REGULAR HUMAN (CONC) 500 UNIT/ML ~~LOC~~ SOPN
50.0000 [IU] | PEN_INJECTOR | Freq: Three times a day (TID) | SUBCUTANEOUS | Status: DC
  Administered 2021-05-26 – 2021-05-27 (×3): 50 [IU] via SUBCUTANEOUS
  Filled 2021-05-26: qty 3

## 2021-05-26 MED ORDER — ACETAMINOPHEN 650 MG RE SUPP
650.0000 mg | Freq: Four times a day (QID) | RECTAL | Status: DC | PRN
  Filled 2021-05-26: qty 1

## 2021-05-26 MED ORDER — LAMOTRIGINE 100 MG PO TABS
150.0000 mg | ORAL_TABLET | Freq: Two times a day (BID) | ORAL | Status: DC
  Administered 2021-05-26 – 2021-06-01 (×13): 150 mg via ORAL
  Filled 2021-05-26 (×13): qty 2

## 2021-05-26 MED ORDER — VITAMIN B-12 1000 MCG PO TABS
1000.0000 ug | ORAL_TABLET | Freq: Every day | ORAL | Status: DC
  Administered 2021-05-26 – 2021-05-28 (×3): 1000 ug via ORAL
  Filled 2021-05-26 (×3): qty 1

## 2021-05-26 MED ORDER — SODIUM CHLORIDE 0.9% FLUSH: 3.0000 mL | INTRAVENOUS | Status: DC | PRN

## 2021-05-26 MED ORDER — FENOFIBRATE 160 MG PO TABS
160.0000 mg | ORAL_TABLET | Freq: Every day | ORAL | Status: DC
  Administered 2021-05-26 – 2021-05-31 (×6): 160 mg via ORAL
  Filled 2021-05-26 (×9): qty 1

## 2021-05-26 MED ORDER — ONDANSETRON HCL 4 MG PO TABS: 4.0000 mg | ORAL_TABLET | Freq: Four times a day (QID) | ORAL | Status: DC | PRN

## 2021-05-26 MED ORDER — AMLODIPINE BESYLATE 5 MG PO TABS
5.0000 mg | ORAL_TABLET | Freq: Every day | ORAL | Status: DC
  Administered 2021-05-26 – 2021-06-01 (×7): 5 mg via ORAL
  Filled 2021-05-26 (×7): qty 1

## 2021-05-26 NOTE — ED Notes (Addendum)
Tele neuro consult with  Dr Buren Kos for this pt at this time  Recommendation :  MRI thoracic-lumbar spine , LP

## 2021-05-26 NOTE — Consult Note (Signed)
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 His recent A1C of 11.5% suggests worsening diabetic neuropathy however recommend to rule out acute spinal process and guillaine barre.  Our recommendations are outlined below.  Diagnostic Studies: Lumbar puncture to be done under fluoroscopy  Free Text Diagnostics: MRI lumbar and thoracic spine w/wo contrast  Laboratory Studies: CSF Cell count with diff, protein, glucose, gram stain, culture, HSV PCR, VDRL, save sample for additional testing  DVT Prophylaxis: Choice of Primary Team.  Disposition: Neurology will follow.  Additional Recommendations: PT/OT   Metrics: [facility] Notification Time: 05/26/2021 04:25:45 Stamp Time: 05/26/2021 04:27:41 Callback Response Time: 05/26/2021 04:28:33   ----------------------------------------------------------------------------------------------------  Chief Complaint: bilateral lower extremity weakness  History of Present Illness: Patient is a 45 year old Male.  45 year old male with a history of severe diabetic neuropathy and poorly controlled sugars who presents with few weeks of worsening leg weakness and difficulty walking. He reports today he felt he had to hold on to the wall to walk because he felt unsteady on his feet. Patient denies any recent illness or viral process. He denies any trouble swallowing or breathing.          Examination: BP(141/76), Pulse(80), Blood Glucose(170)  Neuro Exam: General: Alert,Awake, Oriented to Time, Place, Person  Speech: Fluent:  Language: Intact:  Face: Symmetric:  Facial Sensation:  Intact:  Visual Fields: Intact:  Extraocular Movements: Intact:  Motor Exam: No Drift:  Sensation: Reduced: RLE, LLE  Coordination: Intact:     Patient / Family was informed the Neurology Consult would occur via TeleHealth consult by way of interactive audio and video telecommunications and consented to receiving care in this manner.  Patient is being evaluated for possible acute neurologic impairment and high probability of imminent or life - threatening deterioration.I spent total of 20 minutes providing care to this patient, including time for face to face visit via telemedicine, review of medical records, imaging studies and discussion of findings with providers, the patient and / or family.   Dr Joice Lofts   [facility] [phone]  Case [id number]

## 2021-05-26 NOTE — ED Notes (Addendum)
Tele neuro consult initiated for this pt ; re; will call back once neuro specialist is available c/o Jerry Wheeler  Pt c/o generalized weakness ongoing for past weeks worsenning, balance problems needs to grip on something for support. Pt reported neuropathy from DM. Pt  denies numbness, HA, denies neuro problems like GBS,ALS, denies loss of sensation

## 2021-05-26 NOTE — Progress Notes (Signed)
Patient remains off floor in MRI. Unable to give scheduled meds at this time.

## 2021-05-26 NOTE — H&P (Addendum)
History and Physical    Jerry Wheeler MGQ:[id number] DOB: Jun 12, 1976 DOA: 05/26/2021  PCP: Dion Body, MD   Patient coming from: Home  I have personally briefly reviewed patient's old medical records in [place]  Chief Complaint: Lower extremity weakness  HPI: Jerry Wheeler is a 45 y.o. male with medical history significant for morbid obesity, diabetes mellitus, bipolar disorder and hypertension who presents to the emergency room for evaluation of weakness and inability to ambulate. Patient states that he has peripheral neuropathy related to his underlying diabetes mellitus last 3 weeks he has had progressive lower extremity and 1 day prior to his admission he fell and was able to stand up.  He states that his balance is off and he is able to ambulate without holding onto something.  He denies having any back pain and denies having any urinary or fecal incontinence.  He denies having any fever, no chills, no cough or congestion.  He denies having any recent viral infection. He denies having any chest pain, no shortness of breath, no nausea, no vomiting, no abdominal pain, no diarrhea, no dizziness, no lightheadedness, no palpitations, no diaphoresis, no difficulty swallowing, no leg swelling, no focal deficit or blurred vision. Sodium 138, potassium 3.5, chloride 108, bicarb 23, glucose 170, BUN 17, creatinine 1.04, calcium 10.2, troponin 37, white count 9.6, hemoglobin 13.6, hematocrit 42.2, MCV 85.8, RDW 13.5, platelet count 328, lithium level 0.52 Respiratory viral panel is negative Twelve-lead EKG reviewed by me shows normal sinus rhythm with LVH    ED Course: Patient is a 45 year old male who presents to the ER for evaluation of worsening lower extremity weakness and numbness.  Patient states symptoms started about 3 weeks ago and have progressively worsened.  He is unable to ambulate without holding onto surfaces due to an unsteady gait. He will be admitted to the  hospital for further evaluation.     Review of Systems: As per HPI otherwise all other systems reviewed and negative.    History reviewed. No pertinent past medical history.  History reviewed. No pertinent surgical history.   reports that he quit smoking about 8 years ago. His smoking use included cigarettes. He uses smokeless tobacco. He reports that he does not drink alcohol. No history on file for drug use.  No Known Allergies  Family History  Problem Relation Age of Onset   Hypertension Mother       Prior to Admission medications   Medication Sig Start Date End Date Taking? Authorizing Provider  amLODipine (NORVASC) 5 MG tablet Take 5 mg by mouth daily. 04/15/21  Yes [provider]  aspirin EC 81 MG tablet Take by mouth.   Yes [provider]  atorvastatin (LIPITOR) 40 MG tablet TAKE 1 TABLET BY MOUTH EVERY DAY 04/15/17  Yes [provider]  fenofibrate 160 MG tablet TAKE 1 TABLET BY MOUTH EVERY DAY 04/15/17  Yes [provider]  HUMULIN R U-500 KWIKPEN 500 UNIT/ML KwikPen 75-95 Units 3 (three) times daily with meals. 95 units 1200 75 units 1800 85 units 2400 05/12/21  Yes [provider]  lamoTRIgine (LAMICTAL) 150 MG tablet Take 150 mg by mouth 2 (two) times daily. 05/20/21  Yes [provider]  lithium carbonate (LITHOBID) 300 MG CR tablet TAKE 1 IN THE MORNING AND 2 IN THE EVENING 07/09/17  Yes [provider]  metFORMIN (GLUCOPHAGE) 1000 MG tablet Take 1,000 mg by mouth 2 (two) times daily. 07/30/17  Yes [provider]  tirzepatide Darcel Bayley)  5 MG/0.5ML Pen Inject 5 mg into the skin every 7 (seven) days. 05/24/21  Yes [provider]  vitamin B-12 (CYANOCOBALAMIN) 1000 MCG tablet Take 1,000 mcg by mouth daily.   Yes [provider]  VRAYLAR 3 MG capsule Take 3 mg by mouth daily. 05/12/21  Yes [provider]  Doctor Phillips test strip U TID 07/02/17   [provider]  blood glucose meter kit and supplies KIT Use daily to check blood sugars 07/02/17   [provider]  Blood Glucose Monitoring Suppl (ACCU-CHEK AVIVA PLUS) w/Device KIT U UTD D TO CHECK BS 07/02/17   [provider]  hydrOXYzine (ATARAX/VISTARIL) 25 MG tablet Take by mouth. Patient not taking: Reported on 05/26/2021    [provider]  insulin aspart (NOVOLOG) 100 UNIT/ML injection Inject 20 units three times daily plus 5 units for BG 181-220, and + 10 units for BG >221 before meals Patient not taking: Reported on 05/26/2021 05/14/17   [provider]  insulin degludec (TRESIBA) 200 UNIT/ML FlexTouch Pen INJECT 110 UNITS SUBCUTANEOUSLY NIGHTLY. Patient not taking: Reported on 05/26/2021 01/04/17   [provider]  Insulin Syringe-Needle U-100 31G X 5/16" 1 ML MISC USE AS DIRECTED 3 TIMES DAILY 04/05/17   [provider]  lamoTRIgine (LAMICTAL) 200 MG tablet Take by mouth. Patient not taking: Reported on 05/26/2021 11/20/16   [provider]  lisinopril (PRINIVIL,ZESTRIL) 10 MG tablet Take 10 mg by mouth every morning. Patient not taking: Reported on 05/26/2021 06/12/17   [provider]  OZEMPIC, 1 MG/DOSE, 4 MG/3ML SOPN Inject 1 mg into the skin every 7 (seven) days. Patient not taking: Reported on 05/26/2021 05/26/21   [provider]  VICTOZA 18 MG/3ML SOPN INJECT 0.3 MLS (1.8 MG TOTAL) SUBCUTANEOUSLY ONCE DAILY Patient not taking: Reported on 05/26/2021 07/16/17   [provider]    Physical Exam: Vitals:   05/26/21 0636 05/26/21 0800 05/26/21 0900 05/26/21 1000  BP: (!) 147/86 134/73 131/77 (!) 144/80  Pulse: 86 79 77 78  Resp: _0 Temp:      TempSrc:      SpO2: 99% 97% 96% 97%     Vitals:   05/26/21 0636 05/26/21 0800 05/26/21 0900 05/26/21 1000  BP: (!) 147/86 134/73 131/77 (!) 144/80  Pulse: 86 79 77 78  Resp: _1 Temp:      TempSrc:      SpO2: 99% 97% 96% 97%       Constitutional: Alert and oriented x 3 . Not in any apparent distress.  Morbidly obese HEENT:      Head: Normocephalic and atraumatic.         Eyes: PERLA, EOMI, Conjunctivae are normal. Sclera is non-icteric.       Mouth/Throat: Mucous membranes are moist.       Neck: Supple with no signs of meningismus. Cardiovascular: Regular rate and rhythm. No murmurs, gallops, or rubs. 2+ symmetrical distal pulses are present . No JVD. No LE edema Respiratory: Respiratory effort normal .Lungs sounds clear bilaterally. No wheezes, crackles, or rhonchi.  Gastrointestinal: Soft, non tender, and non distended with positive bowel sounds.  Genitourinary: No CVA tenderness. Musculoskeletal: Nontender with normal range of motion in all extremities. No cyanosis, or erythema of extremities. Neurologic:  Face is symmetric. Moving all extremities. No gross focal neurologic deficits .  Moves all extremities Skin: Skin is warm, dry.  No rash or ulcers Psychiatric: Mood and affect  are normal    Labs on Admission: I have personally reviewed following labs and imaging studies  CBC: Recent Labs  Lab 05/26/21 0033  WBC 9.6  HGB 13.6  HCT 42.2  MCV 85.8  PLT 242   Basic Metabolic Panel: Recent Labs  Lab 05/26/21 0033  NA 138  K 3.5  CL 108  CO2 23  GLUCOSE 170*  BUN 17  CREATININE 1.04  CALCIUM 10.2   GFR: CrCl cannot be calculated (Unknown ideal weight.). Liver Function Tests: No results for input(s): AST, ALT, ALKPHOS, BILITOT, PROT, ALBUMIN in the last 168 hours. No results for input(s): LIPASE, AMYLASE in the last 168 hours. No results for input(s): AMMONIA in the last 168 hours. Coagulation Profile: No results for input(s): INR, PROTIME in the last 168 hours. Cardiac Enzymes: No results for input(s): CKTOTAL, CKMB, CKMBINDEX, TROPONINI in the last 168 hours. BNP (last 3 results) No results for input(s): PROBNP in the last 8760 hours. HbA1C: No results for input(s): HGBA1C in the  last 72 hours. CBG: Recent Labs  Lab 05/26/21 0017  GLUCAP 142*   Lipid Profile: No results for input(s): CHOL, HDL, LDLCALC, TRIG, CHOLHDL, LDLDIRECT in the last 72 hours. Thyroid Function Tests: No results for input(s): TSH, T4TOTAL, FREET4, T3FREE, THYROIDAB in the last 72 hours. Anemia Panel: No results for input(s): VITAMINB12, FOLATE, FERRITIN, TIBC, IRON, RETICCTPCT in the last 72 hours. Urine analysis:    Component Value Date/Time   COLORURINE YELLOW 05/26/2021 0629   APPEARANCEUR CLEAR 05/26/2021 0629   LABSPEC 1.025 05/26/2021 0629   PHURINE 6.0 05/26/2021 0629   GLUCOSEU NEGATIVE 05/26/2021 0629   HGBUR NEGATIVE 05/26/2021 0629   BILIRUBINUR NEGATIVE 05/26/2021 0629   KETONESUR NEGATIVE 05/26/2021 0629   PROTEINUR NEGATIVE 05/26/2021 0629   NITRITE NEGATIVE 05/26/2021 0629   LEUKOCYTESUR NEGATIVE 05/26/2021 0629    Radiological Exams on Admission: No results found.   Assessment/Plan Principal Problem:   Lower extremity weakness Active Problems:   Bipolar disorder (HCC)   DM type 2 with diabetic peripheral neuropathy (HCC)   Recurrent major depressive disorder, in partial remission ([place])   Obesity (BMI 35.0-39.9 without comorbidity)    Patient is a 45 year old male admitted to the hospital for evaluation of bilateral lower extremity weakness.   Bilateral lower extremity weakness Unclear etiology Follow-up results of MRI of thoracic and lumbar spine Place patient on fall precautions Consult neurology    Bipolar disorder Stable Continue lithium, Lamictal and cariprazine    Diabetes mellitus Continue scheduled insulin U500 but will decrease to 15 units with meals Maintain consistent carbohydrate diet     Hypertension Blood pressure stable Continue amlodipine    Morbid obesity Complicates overall prognosis and care  DVT prophylaxis: SCD  Code Status: full code  Family Communication: Greater than 50% of time was spent discussing  patient's condition and plan of care with him at the bedside.  All questions and concerns have been addressed.  He verbalizes understanding and agrees with the plan. Disposition Plan: Back to previous home environment Consults called: Neurology  Status:At the time of admission, it appears that the appropriate admission status for this patient is inpatient. This is judged to be reasonable and necessary to provide the required intensity of service to ensure the patient's safety given the presenting symptoms, physical exam findings, and initial radiographic and laboratory data in the context of their comorbid conditions. Patient requires inpatient status due to high intensity of service, high risk for further deterioration and high frequency of  surveillance required.     Collier Bullock MD [facility]     05/26/2021, 11:05 AM

## 2021-05-26 NOTE — Progress Notes (Signed)
Neurology Progress Note  Patient is a 45 yo man with no hx recent trauma who presented with progressive BLE weakness x3 wks, acutely worsened over the past 24-48 hrs, now with bilateral foot drop. He fell going up the stairs to his house yesterday and came to the ED for further evaluation. He was seen by teleneuro this AM.  On my examination this afternoon:  Vitals:   05/26/21 1200 05/26/21 1500  BP: 138/63 136/76  Pulse:  91  Resp:  14  Temp:  97.9 F (36.6 C)  SpO2:  97%    Physical Exam Gen: A&Ox4, NAD HEENT: Atraumatic, normocephalic; oropharynx clear, tongue without atrophy or fasciculations. Resp: CTAB, normal work of breathing CV: RRR, extremities appear well-perfused. Abd: soft/NT/ND Extrem: Nml bulk; no cyanosis, clubbing, or edema.  Neuro: *MS: A&O x4. Follows multi-step commands.  *Speech: no dysarthria or aphasia, able to name and repeat. *CN:    I: Deferred   II,III: PERRLA, VFF by confrontation, optic discs not visualized 2/2 pupillary constriction   III,IV,VI: EOMI w/o nystagmus, no ptosis   V: Sensation intact from V1 to V3 to LT   VII: Eyelid closure was full.  Smile symmetric.   VIII: Hearing intact to voice   IX,X: Voice normal, palate elevates symmetrically    XI: SCM/trap 5/5 bilat   XII: Tongue protrudes midline, no atrophy or fasciculations  *Motor:   Normal bulk.  No tremor, rigidity or bradykinesia. No pronator drift.   Strength: Dlt Bic Tri WE WrF FgS Gr HF KnF KnE PlF DoF    Left 5 5 5 5 5 5 5 4  4+ 5 2 4+    Right 5 5 5 5 5 5 5  4+ 5 5 3  4+   *Sensory: Length-dependent impairment to PP and vibration BLE *Coordination:  FNF intact bilat *Reflexes:  1+ and symmetric throughout without clonus; toes mute bilat *Gait: deferred  A/P: 45 yo man with no hx recent trauma who presented with progressive BLE weakness x3 wks, acutely worsened over the past 24-48 hrs, now with bilateral foot drop.   LP was attempted to r/o GBS but given 4000K red cells  CSF is not helpful to rule this in or out (protein is markedly elevated, as expected given blood; no pleiocytosis when WBC adjusted for RBC#).  MRI c/t/l spine wwo contrast was performed. 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 Given the acuity of his sx, I recommended to Dr. that neurosurgery be consulted. He has had DM2 for 24 yrs which I suspect is responsible for his hyporeflexia. Given L spine MRI findings my suspicion for concurrent GBS is low and we do not need to reattempt LP at this time.  - Consult to neurosurgery - Will continue to follow  , MD [facility] [phone]  If 7pm- 7am, please page neurology on call as listed in [facility].

## 2021-05-26 NOTE — Procedures (Deleted)
In error

## 2021-05-26 NOTE — ED Provider Notes (Signed)
[facility] Emergency Department Provider Note  ____________________________________________   Event Date/Time   First MD Initiated Contact with Patient 05/26/21 0247     (approximate)  I have reviewed the triage vital signs and the nursing notes.   HISTORY  Chief Complaint Fall (/) and Weakness    HPI Jerry Wheeler is a 45 y.o. male with history of bipolar disorder on lithium, insulin-dependent type 2 diabetes, hyperlipidemia, peripheral neuropathy who presents to the emergency department with complaints of bilateral lower extremity weakness for the past several weeks.  States it has just been progressively worsening but then acutely changed overnight where he felt like his balance was off and it caused him to fall.  He states that every time he stands he feels like his legs are going to give out on him and he does not trust them.  States that he fell forward today but did not hit his head or lose consciousness.  States he does have some intermittent lower back pain especially after sitting for long periods of time.  He denies any bowel or bladder incontinence, urinary retention.  No fever.  No history of cancer, HIV, IV drug abuse, back surgeries or epidural injections.    Patient also states that he did have some chest pain earlier today.  Describes it as "slight" and now is gone.  States it lasted only a few minutes.  No associated shortness of breath, nausea, vomiting, diaphoresis or dizziness.  No history of ACS, PE or DVT.  No lower extremity swelling or pain.  He denies that he is having any lightheadedness or vertigo that makes him feel like he cannot stand and walk.     History reviewed. No pertinent past medical history.  Patient Active Problem List   Diagnosis Date Noted   Lower extremity weakness 05/26/2021   Anxiety 09/12/2017   Bipolar disorder ([place]) 09/12/2017   Insulin dependent diabetes mellitus 09/12/2017   Primary insomnia  09/12/2017   Pure hypercholesterolemia 09/12/2017   Recurrent major depressive disorder, in partial remission ([place]) 09/12/2017   Tobacco dependence 09/12/2017   Vitamin D deficiency 09/12/2017   DM type 2 with diabetic peripheral neuropathy ([place]) 04/06/2016   Hypercalcemia 09/13/2015   Hypogonadism in male 09/13/2015   Non morbid obesity due to excess calories 09/13/2015    History reviewed. No pertinent surgical history.  Prior to Admission medications   Medication Sig Start Date End Date Taking? Authorizing Provider  amLODipine (NORVASC) 5 MG tablet Take 5 mg by mouth daily. 04/15/21  Yes [provider]  aspirin EC 81 MG tablet Take by mouth.   Yes [provider]  atorvastatin (LIPITOR) 40 MG tablet TAKE 1 TABLET BY MOUTH EVERY DAY 04/15/17  Yes [provider]  fenofibrate 160 MG tablet TAKE 1 TABLET BY MOUTH EVERY DAY 04/15/17  Yes [provider]  HUMULIN R U-500 KWIKPEN 500 UNIT/ML KwikPen 75-95 Units 3 (three) times daily with meals. 95 units 1200 75 units 1800 85 units 2400 05/12/21  Yes [provider]  lamoTRIgine (LAMICTAL) 150 MG tablet Take 150 mg by mouth 2 (two) times daily. 05/20/21  Yes [provider]  lithium carbonate (LITHOBID) 300 MG CR tablet TAKE 1 IN THE MORNING AND 2 IN THE EVENING 07/09/17  Yes [provider]  metFORMIN (GLUCOPHAGE) 1000 MG tablet Take 1,000 mg by mouth 2 (two) times daily. 07/30/17  Yes [provider]  tirzepatide Darcel Bayley) 5 MG/0.5ML Pen Inject 5 mg into the skin  every 7 (seven) days. 05/24/21  Yes [provider]  vitamin B-12 (CYANOCOBALAMIN) 1000 MCG tablet Take 1,000 mcg by mouth daily.   Yes [provider]  VRAYLAR 3 MG capsule Take 3 mg by mouth daily. 05/12/21  Yes [provider]  [place] test strip U TID 07/02/17   [provider]  blood glucose meter kit and supplies KIT Use daily to check blood sugars 07/02/17    [provider]  Blood Glucose Monitoring Suppl (ACCU-CHEK AVIVA PLUS) w/Device KIT U UTD D TO CHECK BS 07/02/17   [provider]  hydrOXYzine (ATARAX/VISTARIL) 25 MG tablet Take by mouth. Patient not taking: Reported on 05/26/2021    [provider]  insulin aspart (NOVOLOG) 100 UNIT/ML injection Inject 20 units three times daily plus 5 units for BG 181-220, and + 10 units for BG >221 before meals Patient not taking: Reported on 05/26/2021 05/14/17   [provider]  insulin degludec (TRESIBA) 200 UNIT/ML FlexTouch Pen INJECT 110 UNITS SUBCUTANEOUSLY NIGHTLY. Patient not taking: Reported on 05/26/2021 01/04/17   [provider]  Insulin Syringe-Needle U-100 31G X 5/16" 1 ML MISC USE AS DIRECTED 3 TIMES DAILY 04/05/17   [provider]  lamoTRIgine (LAMICTAL) 200 MG tablet Take by mouth. Patient not taking: Reported on 05/26/2021 11/20/16   [provider]  lisinopril (PRINIVIL,ZESTRIL) 10 MG tablet Take 10 mg by mouth every morning. Patient not taking: Reported on 05/26/2021 06/12/17   [provider]  OZEMPIC, 1 MG/DOSE, 4 MG/3ML SOPN Inject 1 mg into the skin every 7 (seven) days. Patient not taking: Reported on 05/26/2021 05/26/21   [provider]  VICTOZA 18 MG/3ML SOPN INJECT 0.3 MLS (1.8 MG TOTAL) SUBCUTANEOUSLY ONCE DAILY Patient not taking: Reported on 05/26/2021 07/16/17   [provider]    Allergies Patient has no known allergies.  History reviewed. No pertinent family history.  Social History Social History   Tobacco Use   Smoking status: Former    Types: Cigarettes    Quit date: 09/12/2012    Years since quitting: 8.7   Smokeless tobacco: Never   Tobacco comments:    currently vaping  Substance Use Topics   Alcohol use: Yes    Comment: occasional    Review of Systems Constitutional: No fever. Eyes: No visual changes. ENT: No sore throat. Cardiovascular: + chest pain. Respiratory:  Denies shortness of breath. Gastrointestinal: No nausea, vomiting, diarrhea. Genitourinary: Negative for dysuria. Musculoskeletal: + for back pain. Skin: Negative for rash. Neurological: + focal weakness.  No numbness.  ____________________________________________   PHYSICAL EXAM:  VITAL SIGNS: ED Triage Vitals  Enc Vitals Group     BP 05/26/21 0015 137/82     Pulse Rate 05/26/21 0015 96     Resp 05/26/21 0015 17     Temp --      Temp src --      SpO2 05/26/21 0015 97 %     Weight --      Height --      Head Circumference --      Peak Flow --      Pain Score 05/26/21 0011 2     Pain Loc --      Pain Edu? --      Excl. in [place]? --    CONSTITUTIONAL: Alert and oriented and responds appropriately to questions. Well-appearing; well-nourished HEAD: Normocephalic EYES: Conjunctivae clear, pupils appear equal, EOM appear intact ENT: normal nose; moist mucous membranes NECK:  Supple, normal ROM CARD: RRR; S1 and S2 appreciated; no murmurs, no clicks, no rubs, no gallops RESP: Normal chest excursion without splinting or tachypnea; breath sounds clear and equal bilaterally; no wheezes, no rhonchi, no rales, no hypoxia or respiratory distress, speaking full sentences ABD/GI: Normal bowel sounds; non-distended; soft, non-tender, no rebound, no guarding, no peritoneal signs, no hepatosplenomegaly BACK: The back appears normal EXT: Normal ROM in all joints; no deformity noted, no edema; no cyanosis SKIN: Normal color for age and race; warm; no rash on exposed skin NEURO: Moves all extremities equally, when lying in the bed patient has 5/5 strength in all 4 extremities.  He does have a mild resting tremor in bilateral upper extremities.  I do not appreciate dysmetria to finger-nose testing but he does have a tremor which limits evaluation.  He reports decreased sensation from about the mid shin down bilaterally which he states is chronic.  He has no clonus on exam.  Very difficult to elicit  any reflexes in his bilateral upper or lower extremities.  He reports normal sensation in his face and arms.  Cranial nerves II through XII intact.  When I get patient up to stand he begins swaying to the right and has to be sat down immediately. PSYCH: The patient's mood and manner are appropriate.  ____________________________________________   LABS (all labs ordered are listed, but only abnormal results are displayed)  Labs Reviewed  BASIC METABOLIC PANEL - Abnormal; Notable for the following components:      Result Value   Glucose, Bld 170 (*)    All other components within normal limits  LITHIUM LEVEL - Abnormal; Notable for the following components:   Lithium Lvl 0.52 (*)    All other components within normal limits  CBG MONITORING, ED - Abnormal; Notable for the following components:   Glucose-Capillary 142 (*)    All other components within normal limits  TROPONIN I (HIGH SENSITIVITY) - Abnormal; Notable for the following components:   Troponin I (High Sensitivity) 37 (*)    All other components within normal limits  TROPONIN I (HIGH SENSITIVITY) - Abnormal; Notable for the following components:   Troponin I (High Sensitivity) 36 (*)    All other components within normal limits  RESP PANEL BY RT-PCR (FLU A&B, COVID) ARPGX2  URINALYSIS, ROUTINE W REFLEX MICROSCOPIC  CBC   ____________________________________________  EKG   EKG Interpretation  Date/Time:  Friday May 26 2021 00:28:51 EST Ventricular Rate:  95 PR Interval:  152 QRS Duration: 114 QT Interval:  372 QTC Calculation: 467 R Axis:   -22 Text Interpretation: Normal sinus rhythm Moderate voltage criteria for LVH, may be normal variant ( R in aVL , Cornell product ) Anterior infarct , age undetermined ST & T wave abnormality, consider lateral ischemia Abnormal ECG No old tracing to compare Confirmed by Pryor Curia [phone]) on 05/26/2021 4:56:25 AM         ____________________________________________  RADIOLOGY Jessie Foot , personally viewed and evaluated these images (plain radiographs) as part of my medical decision making, as well as reviewing the written report by the radiologist.  ED MD interpretation:    Official radiology report(s): No results found.  ____________________________________________   PROCEDURES  Procedure(s) performed (including Critical Care):  Procedures   ____________________________________________   INITIAL IMPRESSION / ASSESSMENT AND PLAN / ED COURSE  As part of my medical decision making, I reviewed the following data within the [place] History obtained from family, Nursing notes reviewed and  incorporated, Labs reviewed , EKG interpreted , Old EKG reviewed, Discussed with admitting physician , A consult was requested and obtained from this/these consultant(s) Neurology, and Notes from prior ED visits         Patient here with progressively worsening lower extremity numbness and weakness.  Symptoms may be secondary to diabetic neuropathy but given acute change with fall today will discuss with neurology for further evaluation.  I feel patient will likely need imaging to rule out spinal lesion, demyelination, transverse myelitis, mass.  Doubt cauda equina but this is also on the differential.  No risk factors to suggest epidural abscess, hematoma, discitis or osteomyelitis.  Differential also includes Guillain-Barr.  Denies any recent influenza vaccination.  I feel patient will likely need admission given he is unable to ambulate.  He denies any injury from his fall.  Did have some atypical chest pain earlier that has resolved.  First troponin mildly elevated with no old for comparison but EKG is nonischemic.  Second troponin pending.  ED PROGRESS  Patient second troponin is flat.  Doubt ACS, PE, dissection.   Discussed with neurologist who has seen patient and recommends  MRI of the thoracic and lumbar spine with and without contrast as well as lumbar puncture under fluoroscopy after MRI is complete to rule out demyelinating lesion, Guillain-Barr.  Agrees with admission to the hospital.  Does not feel LP needs to be performed emergently given patient's symptoms have been ongoing for weeks and progressively worsening slowly.  I feel he will also need an MRI of his brain which I will add on.  Neurologist suspects that most of his symptoms are likely due to worsening neuropathy from diabetes especially given recent hemoglobin A1c of 11.  We did check a lithium level as well to make sure that this was not lithium toxicity causing any of his symptoms and that level was actually slightly low.  Will discuss with hospitalist for admission.   Discussed patient's case with hospitalist, Dr. Damita Dunnings.  I have recommended admission and patient (and family if present) agree with this plan. Admitting physician will place admission orders.   I reviewed all nursing notes, vitals, pertinent previous records and reviewed/interpreted all EKGs, lab and urine results, imaging (as available).  ____________________________________________   FINAL CLINICAL IMPRESSION(S) / ED DIAGNOSES  Final diagnoses:  Weakness of both legs  Fall, initial encounter  Other diabetic neurological complication associated with type 2 diabetes mellitus [facility])     ED Discharge Orders     None       *Please note:  Jerry Wheeler was evaluated in Emergency Department on 05/26/2021 for the symptoms described in the history of present illness. He was evaluated in the context of the global COVID-19 pandemic, which necessitated consideration that the patient might be at risk for infection with the SARS-CoV-2 virus that causes COVID-19. Institutional protocols and algorithms that pertain to the evaluation of patients at risk for COVID-19 are in a state of rapid change based on information released by regulatory  bodies including the CDC and federal and state organizations. These policies and algorithms were followed during the patient's care in the ED.  Some ED evaluations and interventions may be delayed as a result of limited staffing during and the pandemic.*   Note:  This document was prepared using Dragon voice recognition software and may include unintentional dictation errors.    Cleotilde Spadaccini, Delice Bison, DO 05/26/21 [phone]

## 2021-05-26 NOTE — Procedures (Signed)
Refer to full dictation in PACS.  Fluoroscopic guided LP attempted at L2-L3 and L3-L4. Bloody tap at both levels possibly due to needle initially within disc. At both levels, minimal CSF flow was present. Small volume of serosanguinous fluid that was collected has been sent to lab.   Patient tolerated the procedure without complication. Negligible blood loss.

## 2021-05-26 NOTE — Consult Note (Signed)
Neurosurgery-New Consultation Evaluation 05/26/2021 [name] [id number]  Identifying Statement: Jerry Wheeler is a 45 y.o. male from [place] [address] with balance difficulty  Physician Requesting Consultation: Dr Joylene Igo  History of Present Illness: Jerry Wheeler is here for evaluation of balance difficulty.  He states that this has been happening over the past few weeks.  He does note chronic numbness in his feet.  He does not note any focal weakness that is new in his legs but he does note that he feels he is unable to walk given the balance difficulty.  He feels like he is holding onto walls.  He does not note any new numbness.  He does not note any bowel or bladder changes.  He does have a history of diabetes that is poorly controlled and his last A1c was over 11.  He does note a fall yesterday but he does say overall that the balance is causing him the most difficulty.  He had a neurology work-up here and as part of that work-up he did get MRI of the total spine.  We are consulted given the finding of chronic lumbar stenosis.  Past Medical History:  History reviewed. No pertinent past medical history.  Social History: Social History   Socioeconomic History   Marital status: Single    Spouse name: Not on file   Number of children: Not on file   Years of education: Not on file   Highest education level: Not on file  Occupational History   Not on file  Tobacco Use   Smoking status: Former    Types: Cigarettes    Quit date: 09/12/2012    Years since quitting: 8.7   Smokeless tobacco: Current   Tobacco comments:    currently vaping  Vaping Use   Vaping Use: Every day  Substance and Sexual Activity   Alcohol use: Never    Comment: occasional   Drug use: Not on file   Sexual activity: Not on file  Other Topics Concern   Not on file  Social History Narrative   Not on file   Social Determinants of Health   Financial Resource Strain: Not on file  Food  Insecurity: Not on file  Transportation Needs: Not on file  Physical Activity: Not on file  Stress: Not on file  Social Connections: Not on file  Intimate Partner Violence: Not on file     Family History: Family History  Problem Relation Age of Onset   Hypertension Mother     Review of Systems:  Review of Systems - General ROS: Negative Psychological ROS: Negative Ophthalmic ROS: Negative ENT ROS: Negative Hematological and Lymphatic ROS: Negative  Endocrine ROS: Negative Respiratory ROS: Negative Cardiovascular ROS: Negative Gastrointestinal ROS: Negative Genito-Urinary ROS: Negative Musculoskeletal ROS: Negative for back pain Neurological ROS: Negative for leg pain, positive for foot numbness, positive for balance difficulty Dermatological ROS: Negative  Physical Exam: BP (!) 145/82 (BP Location: Right Arm)   Pulse 89   Temp 98 F (36.7 C) (Oral)   Resp 18   SpO2 98%  There is no height or weight on file to calculate BMI. There is no height or weight on file to calculate BSA. General appearance: Alert, cooperative, in no acute distress Head: Normocephalic, atraumatic Eyes: Normal, EOM intact Ext: Mild edema noted in lower extremities  Neurologic exam:  Mental status: alertness: alert, affect: normal Speech: fluent and clear Motor:strength symmetric 5/5 in bilateral hip flexion, knee extension, knee flexion, plantar flexion.  He is 4+/5  in dorsiflexion at full effort Sensory: Decreased light touch circumferentially below the ankles and bilateral feet Gait: Not tested  Laboratory: Results for orders placed or performed during the hospital encounter of 05/26/21  Resp Panel by RT-PCR (Flu A&B, Covid) Nasopharyngeal Swab   Specimen: Nasopharyngeal Swab; Nasopharyngeal(NP) swabs in vial transport medium  Result Value Ref Range   SARS Coronavirus 2 by RT PCR NEGATIVE NEGATIVE   Influenza A by PCR NEGATIVE NEGATIVE   Influenza B by PCR NEGATIVE NEGATIVE   Urinalysis, Routine w reflex microscopic Urine, Clean Catch  Result Value Ref Range   Color, Urine YELLOW YELLOW   APPearance CLEAR CLEAR   Specific Gravity, Urine 1.025 1.005 - 1.030   pH 6.0 5.0 - 8.0   Glucose, UA NEGATIVE NEGATIVE mg/dL   Hgb urine dipstick NEGATIVE NEGATIVE   Bilirubin Urine NEGATIVE NEGATIVE   Ketones, ur NEGATIVE NEGATIVE mg/dL   Protein, ur NEGATIVE NEGATIVE mg/dL   Nitrite NEGATIVE NEGATIVE   Leukocytes,Ua NEGATIVE NEGATIVE  CBC  Result Value Ref Range   WBC 9.6 4.0 - 10.5 K/uL   RBC 4.92 4.22 - 5.81 MIL/uL   Hemoglobin 13.6 13.0 - 17.0 g/dL   HCT 36.6 44.0 - 34.7 %   MCV 85.8 80.0 - 100.0 fL   MCH 27.6 26.0 - 34.0 pg   MCHC 32.2 30.0 - 36.0 g/dL   RDW 42.5 95.6 - 38.7 %   Platelets 328 150 - 400 K/uL   nRBC 0.0 0.0 - 0.2 %  Basic metabolic panel  Result Value Ref Range   Sodium 138 135 - 145 mmol/L   Potassium 3.5 3.5 - 5.1 mmol/L   Chloride 108 98 - 111 mmol/L   CO2 23 22 - 32 mmol/L   Glucose, Bld 170 (H) 70 - 99 mg/dL   BUN 17 6 - 20 mg/dL   Creatinine, Ser 5.64 0.61 - 1.24 mg/dL   Calcium 33.2 8.9 - 95.1 mg/dL   GFR, Estimated >88 >41 mL/min   Anion gap 7 5 - 15  Lithium level  Result Value Ref Range   Lithium Lvl 0.52 (L) 0.60 - 1.20 mmol/L  Glucose, CSF  Result Value Ref Range   Glucose, CSF 122 (H) 40 - 70 mg/dL  Protein, CSF  Result Value Ref Range   Total  Protein, CSF 287 (H) 15 - 45 mg/dL  CSF cell count with differential  Result Value Ref Range   Tube # 1    Color, CSF RED (A) COLORLESS   Appearance, CSF TURBID (A) CLEAR   Supernatant CLEAR    RBC Count, CSF 4,053 (H) 0 - 3 /cu mm   WBC, CSF 13 (HH) 0 - 5 /cu mm   Segmented Neutrophils-CSF 77 %   Lymphs, CSF 22 %   Monocyte-Macrophage-Spinal Fluid 0 %   Eosinophils, CSF 1 %   Other Cells, CSF 0   POC CBG, ED  Result Value Ref Range   Glucose-Capillary 142 (H) 70 - 99 mg/dL  CBG monitoring, ED  Result Value Ref Range   Glucose-Capillary 282 (H) 70 - 99 mg/dL  CBG  monitoring, ED  Result Value Ref Range   Glucose-Capillary 319 (H) 70 - 99 mg/dL   Comment 1 Notify RN   Troponin I (High Sensitivity)  Result Value Ref Range   Troponin I (High Sensitivity) 37 (H) <18 ng/L  Troponin I (High Sensitivity)  Result Value Ref Range   Troponin I (High Sensitivity) 36 (H) <18 ng/L   I  personally reviewed labs  Imaging: MRI spine:1. Multilevel lumbar spondylosis, as detailed above. 2. Severe canal stenosis at L3-L4. 3. Moderate canal stenosis at L2-L3. 4. Mild-to-moderate canal stenosis with moderate-severe left foraminal stenosis at L4-L5.    Impression/Plan:  Jerry Wheeler is here for evaluation of balance difficulty.  He states he had not noticed any new focal weakness or numbness but he does have chronic history of peripheral neuropathy due to diabetes.  He has had a total spine MRI which did reveal some chronic stenosis from L2-L4.  I do think this could be related to some claudicatory type symptoms but he does not appear to be experiencing this and rather does have what appears to be more proprioception difficulty.  His strength exam for me is much better than previous exam and he does relate that he may have been weaker before.  Given the stable peripheral neuropathy, I do not feel that his current presentation is due to the chronic lumbar stenosis.  There are no acute changes to suggest any new compression.  Given this, I would not recommend surgical intervention at this time but I do think he would benefit from physical therapy as well as control of his medical issues.  Given his proprioception is worsening, this could all be due to diabetic neuropathy but it may be prudent to rule out any cardiac or vascular etiologies given his medical history.  He should have orthostatics done if they have not been.  We are available should there be a change in exam but otherwise am glad to see the patient as an outpatient in follow-up.   1.  Diagnosis: Lumbar  stenosis, proprioceptive difficulty  2.  Plan -No urgent intervention needed.  We can follow-up as an outpatient to discuss treatment of his chronic lumbar stenosis  -Patient does need aggressive treatment of his diabetes to prevent further neuropathies.  Would recommend orthostatic evaluation as well as possible cardiac evaluation given his current complaints of balance difficulty

## 2021-05-26 NOTE — Progress Notes (Signed)
Manuela Schwartz paged regarding CBG 328 - insulin pen noted to have been ordered TID which was last given at approximately 1630 per the emar, but no meal coverage is currently ordered. See new orders.

## 2021-05-26 NOTE — ED Triage Notes (Signed)
Pt to ED via POV, to triage desk via wheelchair. Pt c/o increasing weakness x several weeks, reports today feels like he is unable to move around more than normal. Pt states he is unable to move around his apartment, states he is able to stand but is unable to walk without leaning on something due to his balance being off. Pt A&O x4, NAD noted on arrival to ED. Pt denies any injury from the fall

## 2021-05-27 DIAGNOSIS — E1149 Type 2 diabetes mellitus with other diabetic neurological complication: Secondary | ICD-10-CM

## 2021-05-27 DIAGNOSIS — R2681 Unsteadiness on feet: Secondary | ICD-10-CM | POA: Diagnosis present

## 2021-05-27 DIAGNOSIS — W19XXXA Unspecified fall, initial encounter: Secondary | ICD-10-CM

## 2021-05-27 LAB — BASIC METABOLIC PANEL
Anion gap: 6 (ref 5–15)
BUN: 11 mg/dL (ref 6–20)
CO2: 25 mmol/L (ref 22–32)
Calcium: 9.7 mg/dL (ref 8.9–10.3)
Chloride: 106 mmol/L (ref 98–111)
Creatinine, Ser: 0.84 mg/dL (ref 0.61–1.24)
GFR, Estimated: >60 mL/min (ref >60)
Glucose, Bld: 227 mg/dL — ABNORMAL HIGH (ref 70–99)
Potassium: 3.7 mmol/L (ref 3.5–5.1)
Sodium: 137 mmol/L (ref 135–145)

## 2021-05-27 LAB — CBC
HCT: 42.1 % (ref 39.0–52.0)
Hemoglobin: 13.3 g/dL (ref 13.0–17.0)
MCH: 27 pg (ref 26.0–34.0)
MCHC: 31.6 g/dL (ref 30.0–36.0)
MCV: 85.4 fL (ref 80.0–100.0)
Platelets: 266 10*3/uL (ref 150–400)
RBC: 4.93 MIL/uL (ref 4.22–5.81)
RDW: 13.3 % (ref 11.5–15.5)
WBC: 6.4 10*3/uL (ref 4.0–10.5)
nRBC: 0 % (ref 0.0–0.2)

## 2021-05-27 LAB — GLUCOSE, CAPILLARY
Glucose-Capillary: 101 mg/dL — ABNORMAL HIGH (ref 70–99)
Glucose-Capillary: 171 mg/dL — ABNORMAL HIGH (ref 70–99)
Glucose-Capillary: 192 mg/dL — ABNORMAL HIGH (ref 70–99)
Glucose-Capillary: 240 mg/dL — ABNORMAL HIGH (ref 70–99)
Glucose-Capillary: 268 mg/dL — ABNORMAL HIGH (ref 70–99)
Glucose-Capillary: 310 mg/dL — ABNORMAL HIGH (ref 70–99)
Glucose-Capillary: 311 mg/dL — ABNORMAL HIGH (ref 70–99)
Glucose-Capillary: 335 mg/dL — ABNORMAL HIGH (ref 70–99)

## 2021-05-27 LAB — HIV ANTIBODY (ROUTINE TESTING W REFLEX): HIV Screen 4th Generation wRfx: NONREACTIVE

## 2021-05-27 LAB — VITAMIN B12: Vitamin B-12: 1415 pg/mL — ABNORMAL HIGH (ref 180–914)

## 2021-05-27 MED ORDER — CARIPRAZINE HCL 1.5 MG PO CAPS
3.0000 mg | ORAL_CAPSULE | Freq: Every day | ORAL | Status: DC
  Administered 2021-05-27 – 2021-06-01 (×6): 3 mg via ORAL
  Filled 2021-05-27 (×6): qty 2

## 2021-05-27 MED ORDER — INSULIN REGULAR HUMAN (CONC) 500 UNIT/ML ~~LOC~~ SOPN
85.0000 [IU] | PEN_INJECTOR | Freq: Three times a day (TID) | SUBCUTANEOUS | Status: DC
  Administered 2021-05-27 – 2021-06-01 (×15): 85 [IU] via SUBCUTANEOUS
  Filled 2021-05-27: qty 3

## 2021-05-27 NOTE — Evaluation (Signed)
Occupational Therapy Evaluation Patient Details Name: Jerry Wheeler MRN: [id number] DOB: February 03, 1976 Today's Date: 05/27/2021   History of Present Illness Jerry Wheeler is a 45 y.o. male with medical history significant for morbid obesity, diabetes mellitus, bipolar disorder and hypertension who presents to the emergency room for evaluation of weakness and inability to ambulate.  Patient states that he has peripheral neuropathy related to his underlying diabetes mellitus last 3 weeks he has had progressive lower extremity and 1 day prior to his admission he fell and was able to stand up.  He states that his balance is off and he is able to ambulate without holding onto something.  He denies having any back pain and denies having any urinary or fecal incontinence.  He denies having any fever, no chills, no cough or congestion.  He denies having any recent viral infection.   Clinical Impression   Pt seen for OT evaluation this date.  Pt lives alone in a 2 story apartment with his bedroom and bathroom upstairs.  In the past he reports being able to manage the stairs using handrail, in the last 3 weeks he reports decreased strength, activity tolerance and difficulty managing porch steps which does not have a handrail.  He reports his dad and brother live nearby and are able to assist as needed.  Pt presents with bilateral LE neuropathy, decreased strength, decreased activity tolerance, decreased balance, bilateral UE tremors and decreased ability to perform his daily self care and IADL tasks.  He would benefit from skilled OT services to maximize his safety and independence in necessary daily tasks.  Given that his bedroom and bathroom are on the 2nd level of his apartment and his current balance and endurance levels, he will likely require staying with a family member or have someone stay with him and provide modifications to his home environment [facility] downstairs with assistance to manage it, sponge  bath and a bed downstairs) until he can safely manage the stairs.  Depending on his test results and progress over the next few days he may benefit from inpatient rehab if he qualifies otherwise, he would benefit from [facility] upon discharge if he has sufficient help at home.       Recommendations for follow up therapy are one component of a multi-disciplinary discharge planning process, led by the attending physician.  Recommendations may be updated based on patient status, additional functional criteria and insurance authorization.   Follow Up Recommendations  Acute inpatient rehab (3hours/day)    Assistance Recommended at Discharge Frequent or constant Supervision/Assistance  Functional Status Assessment  Patient has had a recent decline in their functional status and demonstrates the ability to make significant improvements in function in a reasonable and predictable amount of time.  Equipment Recommendations  BSC/3in1;Tub/shower bench    Recommendations for Other Services       Precautions / Restrictions Precautions Precautions: Fall Restrictions Weight Bearing Restrictions: No      Mobility Bed Mobility Overal bed mobility: Modified Independent                  Transfers Overall transfer level: Needs assistance Equipment used: Rolling walker (2 wheels) Transfers: Sit to/from Stand Sit to Stand: Min guard           General transfer comment: Increased effort with sit to stand, cues for hand placement.  Adjusted walker to highest height since pt is 6 foot 7.      Balance Overall balance assessment: Needs assistance;History of Falls Sitting-balance support:  Feet supported Sitting balance-Leahy Scale: Good     Standing balance support: Bilateral upper extremity supported Standing balance-Leahy Scale: Fair                             ADL either performed or assessed with clinical judgement   ADL Overall ADL's : Needs  assistance/impaired Eating/Feeding: Modified independent   Grooming: Standing;Minimal assistance Grooming Details (indicate cue type and reason): Min guard at sink for standing, decreased balance when removing his hands from walker to attempt to place paste on toothbrush. Upper Body Bathing: Modified independent     Lower Body Bathing Details (indicate cue type and reason): Pt can demonstrate from seated position but would be unsafe currently to shower in standing to manage LE bathing Upper Body Dressing : Modified independent   Lower Body Dressing: Min guard;Minimal assistance Lower Body Dressing Details (indicate cue type and reason): Able to complete shoe donning and doffing from seated position, min guard to min assist with standing to negotiate clothing over hips. Toilet Transfer: Nurse, adult (2 wheels)   Toileting- Architect and Hygiene: Min guard;Minimal assistance       Functional mobility during ADLs: Min guard;Rolling walker (2 wheels)       Vision Baseline Vision/History: 1 Wears glasses Patient Visual Report: No change from baseline       Perception     Praxis      Pertinent Vitals/Pain Pain Assessment: No/denies pain     Hand Dominance Right   Extremity/Trunk Assessment Upper Extremity Assessment Upper Extremity Assessment: Generalized weakness   Lower Extremity Assessment Lower Extremity Assessment: Defer to PT evaluation       Communication Communication Communication: No difficulties   Cognition Arousal/Alertness: Awake/alert Behavior During Therapy: WFL for tasks assessed/performed Overall Cognitive Status: Within Functional Limits for tasks assessed                                       General Comments       Exercises     Shoulder Instructions      Home Living Family/patient expects to be discharged to:: Private residence Living Arrangements: Alone Available Help at Discharge: Family Type of  Home: Apartment       Home Layout: Two level;Bed/bath upstairs Alternate Level Stairs-Number of Steps: 14 Alternate Level Stairs-Rails: Right Bathroom Shower/Tub: Tub/shower unit;Curtain   Bathroom Toilet: Standard     Home Equipment: None          Prior Functioning/Environment Prior Level of Function : Independent/Modified Independent;Driving             Mobility Comments: Pt reports he was previously independent with ambulation, reports decreased balance over the last 3 weeks, does not recall any events that would have affected his balance.  Reports his bedroom and only bathroom are located upstairs, reports he does well if he has a handrail which he does have going up and down the stairs however reports when stepping down off his porch there is no handrail. ADLs Comments: Pt reports being independent prior with self care and IADL tasks.  He does not work and is currently on disability.  He reports decreased endurance over the last 3 weeks and progressive leg weakness when attempting to perform tasks in standing.  Pt has bilateral LE neuropathy and appears to demonstrate decreased proprioception when vision is occluded (for  example when drying his hair off with towel and towel is covering his eyes.  Pt has bilateral tremors in his UEs and reports he has had this for years.        OT Problem List: Decreased strength;Decreased activity tolerance;Decreased knowledge of use of DME or AE;Impaired balance (sitting and/or standing)      OT Treatment/Interventions: Self-care/ADL training;Balance training;Therapeutic exercise;DME and/or AE instruction;Therapeutic activities;Patient/family education    OT Goals(Current goals can be found in the care plan section) Acute Rehab OT Goals Patient Stated Goal: Pt would like to return home and be independent OT Goal Formulation: With patient Time For Goal Achievement: 05/27/21 Potential to Achieve Goals: Good ADL Goals Pt Will Transfer to  Toilet: with modified independence Pt Will Perform Toileting - Clothing Manipulation and hygiene: with modified independence Pt/caregiver will Perform Home Exercise Program: Increased strength  OT Frequency: Min 3X/week   Barriers to D/C:    Pt's bathroom and bedroom are located on 2nd floor and given his balance and decreased activity tolerance it would likely be unsafe to try to manage the stairs currently.  He does report his dad and brother live nearby and may be able to assist him.       Co-evaluation              AM-PAC OT "6 Clicks" Daily Activity     Outcome Measure Help from another person eating meals?: None Help from another person taking care of personal grooming?: A Little Help from another person toileting, which includes using toliet, bedpan, or urinal?: A Little Help from another person bathing (including washing, rinsing, drying)?: A Little Help from another person to put on and taking off regular upper body clothing?: None Help from another person to put on and taking off regular lower body clothing?: None 6 Click Score: 21   End of Session Equipment Utilized During Treatment: Rolling walker (2 wheels);Gait belt  Activity Tolerance: Patient tolerated treatment well Patient left: in bed;with call bell/phone within reach;with bed alarm set  OT Visit Diagnosis: Unsteadiness on feet (R26.81);History of falling (Z91.81);Muscle weakness (generalized) (M62.81)                Time: 1340-1411 OT Time Calculation (min): 31 min Charges:  OT General Charges $OT Visit: 1 Visit OT Evaluation $OT Eval Moderate Complexity: 1 Mod OT Treatments $Self Care/Home Management : 8-22 mins  Amberlea Spagnuolo T Jerry Wheeler, OTR/L, CLT   Jerry Wheeler 05/27/2021, 2:59 PM

## 2021-05-27 NOTE — Progress Notes (Signed)
Subjective: - Strength somewhat improved on my examination today. Still has bilateral foot drop. Seen by [facility] who recommended outpatient f/u, no urgent intervention indicated. BG >300 today, adjusting insulin regimen.  Exam: Vitals:   05/27/21 1200 05/27/21 1551  BP:  (!) 156/84  Pulse: 98 91  Resp: 20 20  Temp:  97.8 F (36.6 C)  SpO2:  99%   Gen: In bed, NAD Resp: non-labored breathing, no acute distress Abd: soft, nt  Neuro: *MS: A&O x4. Follows multi-step commands.  *Speech: no dysarthria or aphasia, able to name and repeat. *CN:    I: Deferred   II,III: PERRLA, VFF by confrontation, optic discs not visualized 2/2 pupillary constriction   III,IV,VI: EOMI w/o nystagmus, no ptosis   V: Sensation intact from V1 to V3 to LT   VII: Eyelid closure was full.  Smile symmetric.   VIII: Hearing intact to voice   IX,X: Voice normal, palate elevates symmetrically    XI: SCM/trap 5/5 bilat   XII: Tongue protrudes midline, no atrophy or fasciculations  *Motor:   Normal bulk.  No tremor, rigidity or bradykinesia. No pronator drift.    Strength: Dlt Bic Tri WE WrF FgS Gr HF KnF KnE PlF DoF   Left 5 5 5 5 5 5 5 5 5 5 5 2  4+   Right 5 5 5 5 5 5 5 5 5 5 5 3  4+   *Sensory: Length-dependent impairment to PP and vibration BLE *Coordination:  FNF intact bilat *Reflexes:  1+ and symmetric throughout without clonus; toes mute bilat *Gait: deferred  Impression: 45 yo man with no hx recent trauma who presented with progressive BLE weakness x3 wks, acutely worsened over the past 24-48 hrs, now with bilateral foot drop. 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 NSU would like to see him as an outpatient for further evaluation and recommends no urgent intervention at this time, which is completely reasonable given interim improvement in his exam and uncontrolled  hyperglycemia this admission.  Recommendations: - F/u with NSU outpatient as scheduled - Patient would also benefit from referral to neurology for evaluation and EMG/NCS; I will arrange - No further inpatient neurologic workup indicated at this time. Neurology to sign off, but please re-engage if additional questions arise.  , MD [facility] [phone]  If 7pm- 7am, please page neurology on call as listed in AMION.

## 2021-05-27 NOTE — Progress Notes (Addendum)
Progress Note    Tell Jerry Wheeler  QJJ:[id number] DOB: 13-Oct-1975  DOA: 05/26/2021 PCP: Marisue Ivan, MD      Brief Narrative:    Medical records reviewed and are as summarized below:  Jerry Wheeler is a 45 y.o. male with medical history significant for type II DM, peripheral neuropathy, hypertension, bipolar disorder, who presented to the hospital because of progressive generalized weakness and inability to ambulate.  He fell the day prior to admission and was unable to stand up.      Assessment/Plan:   Principal Problem:   Lower extremity weakness Active Problems:   Bipolar disorder (HCC)   DM type 2 with diabetic peripheral neuropathy (HCC)   Recurrent major depressive disorder, in partial remission (HCC)   Obesity (BMI 35.0-39.9 without comorbidity)   Unsteady gait   Fall at home, initial encounter    Body mass index is 36.34 kg/m.  (Morbid obesity)   Unsteady gait/lower extremity weakness, s/p fall at home: Suspected to be from peripheral neuropathy.  Consulted PT and OT.  OT recommended discharge to inpatient rehab for further rehabilitation.  Awaiting PT evaluation.  Check Vitamin B12 level.  Appreciate input from neurologist and neurosurgeon.  Lumbar puncture yielded a bloody tap but no need for repeat LP neurologist.  Chronic lumbar stenosis from L2-L4: He has been evaluated by the neurosurgeon.  No plan for surgical intervention.  Outpatient follow-up with neurosurgeon for further management.  Type II DM with hyperglycemia and peripheral neuropathy: Continue concentrated regular insulin monitor glucose levels.  He recently saw his endocrinologist in the office.  Continue outpatient follow-up with endocrinologist.  Bipolar disorder: Continue psychotropics  Hypertension: Continue amlodipine   Diet Order             Diet heart healthy/carb modified Room service appropriate? Yes; Fluid consistency: Thin  Diet effective now                       Consultants: Neurologist Neurosurgeon  Procedures: Lumbar puncture with bloody tap    Medications:    amLODipine  5 mg Oral Daily   atorvastatin  40 mg Oral Daily   cariprazine  3 mg Oral Daily   fenofibrate  160 mg Oral Daily   insulin regular human CONCENTRATED  85 Units Subcutaneous TID WC   lamoTRIgine  150 mg Oral BID   lithium carbonate  300 mg Oral BID   sodium chloride flush  3 mL Intravenous Q12H   vitamin B-12  1,000 mcg Oral Daily   Continuous Infusions:  sodium chloride       Anti-infectives (From admission, onward)    None              Family Communication/Anticipated D/C date and plan/Code Status   DVT prophylaxis: SCDs Start: 05/26/21 1110     Code Status: Full Code  Family Communication: None Disposition Plan: Possible discharge to inpatient rehab  Status is: Inpatient  Remains inpatient appropriate because: Unsafe discharge plan           Subjective:   C/o unsteady gait  Objective:    Vitals:   05/27/21 1141 05/27/21 1145 05/27/21 1200 05/27/21 1551  BP: (!) 149/84 (!) 157/85  (!) 156/84  Pulse: 93 (!) 114 98 91  Resp:   20 20  Temp:    97.8 F (36.6 C)  TempSrc:      SpO2: 99% 97%  99%  Height:       Orthostatic  VS for the past 24 hrs:  BP- Lying Pulse- Lying BP- Sitting Pulse- Sitting BP- Standing at 0 minutes Pulse- Standing at 0 minutes  05/27/21 0811 153/80 83 149/84 95 159/86 114     Intake/Output Summary (Last 24 hours) at 05/27/2021 1625 Last data filed at 05/27/2021 1410 Gross per 24 hour  Intake 960 ml  Output 1900 ml  Net -940 ml   There were no vitals filed for this visit.  Exam:  GEN: NAD SKIN: No rash EYES: EOMI ENT: MMM CV: RRR PULM: CTA B ABD: soft, obese, NT, +BS CNS: AAO x 3, non focal EXT: No edema or tenderness        Data Reviewed:   I have personally reviewed following labs and imaging studies:  Labs: Labs show the following:   Basic Metabolic  Panel: Recent Labs  Lab 05/26/21 0033 05/27/21 0646  NA 138 137  K 3.5 3.7  CL 108 106  CO2 23 25  GLUCOSE 170* 227*  BUN 17 11  CREATININE 1.04 0.84  CALCIUM 10.2 9.7   GFR CrCl cannot be calculated (Unknown ideal weight.). Liver Function Tests: No results for input(s): AST, ALT, ALKPHOS, BILITOT, PROT, ALBUMIN in the last 168 hours. No results for input(s): LIPASE, AMYLASE in the last 168 hours. No results for input(s): AMMONIA in the last 168 hours. Coagulation profile No results for input(s): INR, PROTIME in the last 168 hours.  CBC: Recent Labs  Lab 05/26/21 0033 05/27/21 0646  WBC 9.6 6.4  HGB 13.6 13.3  HCT 42.2 42.1  MCV 85.8 85.4  PLT 328 266   Cardiac Enzymes: No results for input(s): CKTOTAL, CKMB, CKMBINDEX, TROPONINI in the last 168 hours. BNP (last 3 results) No results for input(s): PROBNP in the last 8760 hours. CBG: Recent Labs  Lab 05/26/21 2004 05/27/21 0213 05/27/21 0736 05/27/21 1141 05/27/21 1557  GLUCAP 328* 192* 240* 335* 311*   D-Dimer: No results for input(s): DDIMER in the last 72 hours. Hgb A1c: No results for input(s): HGBA1C in the last 72 hours. Lipid Profile: No results for input(s): CHOL, HDL, LDLCALC, TRIG, CHOLHDL, LDLDIRECT in the last 72 hours. Thyroid function studies: No results for input(s): TSH, T4TOTAL, T3FREE, THYROIDAB in the last 72 hours.  Invalid input(s): FREET3 Anemia work up: No results for input(s): VITAMINB12, FOLATE, FERRITIN, TIBC, IRON, RETICCTPCT in the last 72 hours. Sepsis Labs: Recent Labs  Lab 05/26/21 0033 05/27/21 0646  WBC 9.6 6.4    Microbiology Recent Results (from the past 240 hour(s))  Resp Panel by RT-PCR (Flu A&B, Covid) Nasopharyngeal Swab     Status: None   Collection Time: 05/26/21  4:29 AM   Specimen: Nasopharyngeal Swab; Nasopharyngeal(NP) swabs in vial transport medium  Result Value Ref Range Status   SARS Coronavirus 2 by RT PCR NEGATIVE NEGATIVE Final    Comment:  (NOTE) SARS-CoV-2 target nucleic acids are NOT DETECTED.  The SARS-CoV-2 RNA is generally detectable in upper respiratory specimens during the acute phase of infection. The lowest concentration of SARS-CoV-2 viral copies this assay can detect is 138 copies/mL. A negative result does not preclude SARS-Cov-2 infection and should not be used as the sole basis for treatment or other patient management decisions. A negative result may occur with  improper specimen collection/handling, submission of specimen other than nasopharyngeal swab, presence of viral mutation(s) within the areas targeted by this assay, and inadequate number of viral copies(<138 copies/mL). A negative result must be combined with clinical observations, patient history, and  epidemiological information. The expected result is Negative.  Fact Sheet for Patients:  BloggerCourse.com  Fact Sheet for Healthcare Providers:  SeriousBroker.it  This test is no t yet approved or cleared by the [place] FDA and  has been authorized for detection and/or diagnosis of SARS-CoV-2 by FDA under an Emergency Use Authorization (EUA). This EUA will remain  in effect (meaning this test can be used) for the duration of the COVID-19 declaration under Section 564(b)(1) of the Act, 21 U.S.C.section 360bbb-3(b)(1), unless the authorization is terminated  or revoked sooner.       Influenza A by PCR NEGATIVE NEGATIVE Final   Influenza B by PCR NEGATIVE NEGATIVE Final    Comment: (NOTE) The Xpert Xpress SARS-CoV-2/FLU/RSV plus assay is intended as an aid in the diagnosis of influenza from Nasopharyngeal swab specimens and should not be used as a sole basis for treatment. Nasal washings and aspirates are unacceptable for Xpert Xpress SARS-CoV-2/FLU/RSV testing.  Fact Sheet for Patients: BloggerCourse.com  Fact Sheet for Healthcare  Providers: SeriousBroker.it  This test is not yet approved or cleared by the [place] FDA and has been authorized for detection and/or diagnosis of SARS-CoV-2 by FDA under an Emergency Use Authorization (EUA). This EUA will remain in effect (meaning this test can be used) for the duration of the COVID-19 declaration under Section 564(b)(1) of the Act, 21 U.S.C. section 360bbb-3(b)(1), unless the authorization is terminated or revoked.  Performed at [facility], [address] [place], [place] [address]     Procedures and diagnostic studies:  MR CERVICAL SPINE W WO CONTRAST  Result Date: 05/26/2021 CLINICAL DATA:  Neuro deficit, acute, stroke suspected.  Weakness EXAM: MRI CERVICAL SPINE WITHOUT AND WITH CONTRAST TECHNIQUE: Multiplanar and multiecho pulse sequences of the cervical spine, to include the craniocervical junction and cervicothoracic junction, were obtained without and with intravenous contrast. CONTRAST:  10mL GADAVIST GADOBUTROL 1 MMOL/ML IV SOLN COMPARISON:  None. FINDINGS: Technical Note: Despite efforts by the technologist and patient, motion artifact is present on today's exam and could not be eliminated. This reduces exam sensitivity and specificity. Alignment: Physiologic. Vertebrae: No fracture, evidence of discitis, or bone lesion. Cord: Normal signal and morphology. Posterior Fossa, vertebral arteries, paraspinal tissues: Negative. Disc levels: C2-C3: No significant disc protrusion, foraminal stenosis, or canal stenosis. C3-C4: Minimal disc bulge with right sided uncovertebral spurring. No foraminal or canal stenosis. C4-C5: No disc bulge. Minimal right uncovertebral spurring. No foraminal or canal stenosis. C5-C6: No disc bulge. Minimal right uncovertebral spurring. No foraminal or canal stenosis. C6-C7: Shallow central disc protrusion. No foraminal or canal stenosis. C7-T1: No significant disc protrusion, foraminal stenosis, or  canal stenosis. IMPRESSION: 1. No acute findings.  No abnormal cord signal. 2. Early mild cervical spondylosis. No significant foraminal or canal stenosis at any level. Electronically Signed   By: Duanne Guess D.O.   On: 05/26/2021 14:29   MR THORACIC SPINE W WO CONTRAST  Result Date: 05/26/2021 CLINICAL DATA:  Trunk numbness or tingling weakness in both legs, unable to walk EXAM: MRI THORACIC WITHOUT AND WITH CONTRAST TECHNIQUE: Multiplanar and multiecho pulse sequences of the thoracic spine were obtained without and with intravenous contrast. CONTRAST:  10mL GADAVIST GADOBUTROL 1 MMOL/ML IV SOLN COMPARISON:  None. FINDINGS: Alignment:  Physiologic. Vertebrae: No fracture, evidence of discitis, or bone lesion. Cord:  Normal signal and morphology. Paraspinal and other soft tissues: Negative. Disc levels: T1-T2: No significant canal or neural foraminal narrowing. T2-T3: No significant canal or neural foraminal narrowing. T3-T4:  No significant canal or neural foraminal narrowing. T4-T5: No significant canal or neural foraminal narrowing. T5-T6: Shallow central disc protrusion. No significant canal or neural foraminal narrowing. T6-T7: No significant canal or neural foraminal narrowing. T7-T8: No significant canal or neural foraminal narrowing. T8-T9: No disc protrusion. Mild bilateral facet arthropathy. Borderline-mild bilateral foraminal stenosis. No canal stenosis. T9-T10: No disc protrusion. Mild bilateral facet arthropathy. Mild bilateral foraminal stenosis. No canal stenosis. T10-T11: No disc protrusion. Mild bilateral facet arthropathy. Mild bilateral foraminal stenosis. No canal stenosis. T11-T12: No significant canal or neural foraminal narrowing. IMPRESSION: 1. No acute findings. No evidence of cord compression or abnormal cord signal. 2. Mild degenerative changes of the thoracic spine with mild bilateral foraminal stenosis at T9-T10 and T10-T11. No canal stenosis at any level. Electronically Signed    By: Duanne Guess D.O.   On: 05/26/2021 14:15   MR Lumbar Spine W Wo Contrast  Result Date: 05/26/2021 CLINICAL DATA:  Low back pain, cauda equina syndrome suspected Myelopathy, acute, lumbar spine weakness in both legs, can not walk EXAM: MRI LUMBAR SPINE WITHOUT AND WITH CONTRAST TECHNIQUE: Multiplanar and multiecho pulse sequences of the lumbar spine were obtained without and with intravenous contrast. CONTRAST:  63mL GADAVIST GADOBUTROL 1 MMOL/ML IV SOLN COMPARISON:  None. FINDINGS: Segmentation:  Standard. Alignment: Mild lumbar levocurvature. Slight retrolisthesis of L3 on L4. Vertebrae:  No fracture, evidence of discitis, or bone lesion. Conus medullaris and cauda equina: Conus extends to the T12 level. Conus and cauda equina appear normal. Paraspinal and other soft tissues: Negative. Disc levels: T12-L1: Unremarkable disc. Mild bilateral facet hypertrophy. No foraminal or canal stenosis. L1-L2: Minimal annular disc bulge. Mild bilateral facet arthropathy. No foraminal or canal stenosis. L2-L3: Diffuse disc bulge with right foraminal disc protrusion. Mild bilateral facet arthropathy. Moderate canal stenosis. No significant foraminal stenosis. L3-L4: Diffuse disc bulge with central disc protrusion. Moderate bilateral facet arthropathy. Severe canal stenosis with mild bilateral foraminal stenosis. L4-L5: Diffuse disc bulge, eccentric to the left. Moderate bilateral facet arthropathy. Findings result in mild-to-moderate canal stenosis with moderate-severe left foraminal stenosis. L5-S1: Mild central disc protrusion. Moderate bilateral facet hypertrophy. No foraminal or canal stenosis. IMPRESSION: 1. Multilevel lumbar spondylosis, as detailed above. 2. Severe canal stenosis at L3-L4. 3. Moderate canal stenosis at L2-L3. 4. Mild-to-moderate canal stenosis with moderate-severe left foraminal stenosis at L4-L5. Electronically Signed   By: Duanne Guess D.O.   On: 05/26/2021 14:21   DG FL GUIDED LUMBAR  PUNCTURE  Result Date: 05/26/2021 CLINICAL DATA:  Weakness of both legs EXAM: DIAGNOSTIC LUMBAR PUNCTURE UNDER FLUOROSCOPIC GUIDANCE COMPARISON:  None FLUOROSCOPY TIME:  Fluoroscopy Time:  30 seconds Radiation Exposure Index (if provided by the fluoroscopic device): 11.2 mGy PROCEDURE: Informed consent was obtained from the patient prior to the procedure, including potential complications of headache, allergy, and pain. With the patient prone, the lower back was prepped with Betadine. 1% Lidocaine was used for local anesthesia. Lumbar puncture was initially attempted at the L2-L3 level using a 22 gauge needle with return of minimal serosanguineous fluid. Repeat attempt at L2-L3 level resulted in same outcome. Puncture was then attempted at the L3-L4 level. Suspicion flow could not be obtained despite repositioning at this level. Initial lateral radiograph demonstrated needle positioning within the disc. Needle was retracted and subsequent radiograph demonstrated needle in expected position of spinal canal. Again, there was minimal serosanguineous fluid. Obtained scant CSF was sent for laboratory studies. The patient tolerated the procedure well and there were no apparent complications. IMPRESSION: Unsuccessful  fluoroscopic guided lumbar puncture. Only scant serosanguineous CSF was obtained. Electronically Signed   By: Guadlupe Spanish M.D.   On: 05/26/2021 12:33               LOS: 1 day   Tonique Mendonca  [facility]   Pager on [email or web address]. If 7PM-7AM, please contact night-coverage at [email or web address]     05/27/2021, 4:25 PM

## 2021-05-27 NOTE — Progress Notes (Signed)
Pt wanted blood sugar checked because he felt lightheaded. CBG was 101, down from 171 at 2057. Pt concerned with it dropping so much in that amount of time. 4 oz of juice given. Will continue to monitor.

## 2021-05-28 DIAGNOSIS — R2681 Unsteadiness on feet: Secondary | ICD-10-CM

## 2021-05-28 LAB — GLUCOSE, CAPILLARY
Glucose-Capillary: 93 mg/dL (ref 70–99)
Glucose-Capillary: 98 mg/dL (ref 70–99)
Glucose-Capillary: 126 mg/dL — ABNORMAL HIGH (ref 70–99)
Glucose-Capillary: 232 mg/dL — ABNORMAL HIGH (ref 70–99)
Glucose-Capillary: 265 mg/dL — ABNORMAL HIGH (ref 70–99)

## 2021-05-28 MED ORDER — IBUPROFEN 400 MG PO TABS
600.0000 mg | ORAL_TABLET | Freq: Three times a day (TID) | ORAL | Status: DC | PRN
  Administered 2021-05-28 – 2021-05-29 (×2): 600 mg via ORAL
  Filled 2021-05-28 (×2): qty 2

## 2021-05-28 NOTE — Progress Notes (Signed)
Progress Note    Jerry Wheeler  ATF:[id number] DOB: [birth year]  DOA: 05/26/2021 PCP: Marisue Ivan, MD      Brief Narrative:    Medical records reviewed and are as summarized below:  Jerry Wheeler is a 45 y.o. male with medical history significant for type II DM, peripheral neuropathy, hypertension, bipolar disorder, who presented to the hospital because of progressive generalized weakness and inability to ambulate.  He fell the day prior to admission and was unable to stand up.      Assessment/Plan:   Principal Problem:   Lower extremity weakness Active Problems:   Bipolar disorder (HCC)   DM type 2 with diabetic peripheral neuropathy (HCC)   Recurrent major depressive disorder, in partial remission (HCC)   Obesity (BMI 35.0-39.9 without comorbidity)   Unsteady gait   Fall at home, initial encounter    Body mass index is 38.87 kg/m.  (Morbid obesity)   Unsteady gait/lower extremity weakness, s/p fall at home: Suspected to be from peripheral neuropathy.  OT recommended discharge to inpatient rehab center.  Awaiting PT evaluation. Appreciate input from neurologist and neurosurgeon.  Lumbar puncture yielded a bloody tap but no need for repeat LP neurologist.  Chronic lumbar stenosis from L2-L4: He has been evaluated by the neurosurgeon.  No plan for surgical intervention.  Outpatient follow-up with neurosurgeon for further management.  Type II DM with hyperglycemia and peripheral neuropathy: Continue concentrated regular insulin monitor glucose levels.  He recently saw his endocrinologist in the office.  Continue outpatient follow-up with endocrinologist.  Elevated vitamin B12 level: Discontinue vitamin B12 supplement.  Outpatient follow-up with PCP  Bipolar disorder: Continue psychotropics  Hypertension: Continue amlodipine   Diet Order             Diet heart healthy/carb modified Room service appropriate? Yes; Fluid consistency: Thin  Diet  effective now                      Consultants: Neurologist Neurosurgeon  Procedures: Lumbar puncture with bloody tap    Medications:    amLODipine  5 mg Oral Daily   atorvastatin  40 mg Oral Daily   cariprazine  3 mg Oral Daily   fenofibrate  160 mg Oral Daily   insulin regular human CONCENTRATED  85 Units Subcutaneous TID WC   lamoTRIgine  150 mg Oral BID   lithium carbonate  300 mg Oral BID   sodium chloride flush  3 mL Intravenous Q12H   vitamin B-12  1,000 mcg Oral Daily   Continuous Infusions:  sodium chloride       Anti-infectives (From admission, onward)    None              Family Communication/Anticipated D/C date and plan/Code Status   DVT prophylaxis: SCDs Start: 05/26/21 1110     Code Status: Full Code  Family Communication: None Disposition Plan: Possible discharge to inpatient rehab  Status is: Inpatient  Remains inpatient appropriate because: Unsafe discharge plan           Subjective:   No new complaints.  He still has unsteady gait.  Objective:    Vitals:   05/28/21 0427 05/28/21 0434 05/28/21 0738 05/28/21 1100  BP:  (!) 147/80 133/82 133/60  Pulse:  88 82 90  Resp:  18 17 20   Temp:  98.4 F (36.9 C) 98 F (36.7 C) 98.3 F (36.8 C)  TempSrc:  Oral    SpO2:  98% 97%  Weight: (!) 156.5 kg     Height:       No data found.    Intake/Output Summary (Last 24 hours) at 05/28/2021 1248 Last data filed at 05/28/2021 1024 Gross per 24 hour  Intake 1020 ml  Output --  Net 1020 ml   Filed Weights   05/28/21 0427  Weight: (!) 156.5 kg    Exam:  GEN: NAD SKIN: Warm and dry EYES: No pallor or icterus ENT: MMM CV: RRR PULM: CTA B ABD: soft, obese, NT, +BS CNS: AAO x 3, non focal EXT: No edema or tenderness         Data Reviewed:   I have personally reviewed following labs and imaging studies:  Labs: Labs show the following:   Basic Metabolic Panel: Recent Labs  Lab  05/26/21 0033 05/27/21 0646  NA 138 137  K 3.5 3.7  CL 108 106  CO2 23 25  GLUCOSE 170* 227*  BUN 17 11  CREATININE 1.04 0.84  CALCIUM 10.2 9.7   GFR Estimated Creatinine Clearance: 182.7 mL/min (by C-G formula based on SCr of 0.84 mg/dL). Liver Function Tests: No results for input(s): AST, ALT, ALKPHOS, BILITOT, PROT, ALBUMIN in the last 168 hours. No results for input(s): LIPASE, AMYLASE in the last 168 hours. No results for input(s): AMMONIA in the last 168 hours. Coagulation profile No results for input(s): INR, PROTIME in the last 168 hours.  CBC: Recent Labs  Lab 05/26/21 0033 05/27/21 0646  WBC 9.6 6.4  HGB 13.6 13.3  HCT 42.2 42.1  MCV 85.8 85.4  PLT 328 266   Cardiac Enzymes: No results for input(s): CKTOTAL, CKMB, CKMBINDEX, TROPONINI in the last 168 hours. BNP (last 3 results) No results for input(s): PROBNP in the last 8760 hours. CBG: Recent Labs  Lab 05/27/21 2333 05/28/21 0119 05/28/21 0458 05/28/21 0737 05/28/21 1114  GLUCAP 101* 93 98 126* 232*   D-Dimer: No results for input(s): DDIMER in the last 72 hours. Hgb A1c: No results for input(s): HGBA1C in the last 72 hours. Lipid Profile: No results for input(s): CHOL, HDL, LDLCALC, TRIG, CHOLHDL, LDLDIRECT in the last 72 hours. Thyroid function studies: No results for input(s): TSH, T4TOTAL, T3FREE, THYROIDAB in the last 72 hours.  Invalid input(s): FREET3 Anemia work up: Recent Labs    05/27/21 0646  VITAMINB12 1,415*   Sepsis Labs: Recent Labs  Lab 05/26/21 0033 05/27/21 0646  WBC 9.6 6.4    Microbiology Recent Results (from the past 240 hour(s))  Resp Panel by RT-PCR (Flu A&B, Covid) Nasopharyngeal Swab     Status: None   Collection Time: 05/26/21  4:29 AM   Specimen: Nasopharyngeal Swab; Nasopharyngeal(NP) swabs in vial transport medium  Result Value Ref Range Status   SARS Coronavirus 2 by RT PCR NEGATIVE NEGATIVE Final    Comment: (NOTE) SARS-CoV-2 target nucleic acids  are NOT DETECTED.  The SARS-CoV-2 RNA is generally detectable in upper respiratory specimens during the acute phase of infection. The lowest concentration of SARS-CoV-2 viral copies this assay can detect is 138 copies/mL. A negative result does not preclude SARS-Cov-2 infection and should not be used as the sole basis for treatment or other patient management decisions. A negative result may occur with  improper specimen collection/handling, submission of specimen other than nasopharyngeal swab, presence of viral mutation(s) within the areas targeted by this assay, and inadequate number of viral copies(<138 copies/mL). A negative result must be combined with clinical observations, patient history, and epidemiological information. The expected  result is Negative.  Fact Sheet for Patients:  BloggerCourse.com  Fact Sheet for Healthcare Providers:  SeriousBroker.it  This test is no t yet approved or cleared by the [place] FDA and  has been authorized for detection and/or diagnosis of SARS-CoV-2 by FDA under an Emergency Use Authorization (EUA). This EUA will remain  in effect (meaning this test can be used) for the duration of the COVID-19 declaration under Section 564(b)(1) of the Act, 21 U.S.C.section 360bbb-3(b)(1), unless the authorization is terminated  or revoked sooner.       Influenza A by PCR NEGATIVE NEGATIVE Final   Influenza B by PCR NEGATIVE NEGATIVE Final    Comment: (NOTE) The Xpert Xpress SARS-CoV-2/FLU/RSV plus assay is intended as an aid in the diagnosis of influenza from Nasopharyngeal swab specimens and should not be used as a sole basis for treatment. Nasal washings and aspirates are unacceptable for Xpert Xpress SARS-CoV-2/FLU/RSV testing.  Fact Sheet for Patients: BloggerCourse.com  Fact Sheet for Healthcare Providers: SeriousBroker.it  This test is  not yet approved or cleared by the [place] FDA and has been authorized for detection and/or diagnosis of SARS-CoV-2 by FDA under an Emergency Use Authorization (EUA). This EUA will remain in effect (meaning this test can be used) for the duration of the COVID-19 declaration under Section 564(b)(1) of the Act, 21 U.S.C. section 360bbb-3(b)(1), unless the authorization is terminated or revoked.  Performed at [facility] [address] [place], [place] [address]     Procedures and diagnostic studies:  MR CERVICAL SPINE W WO CONTRAST  Result Date: 05/26/2021 CLINICAL DATA:  Neuro deficit, acute, stroke suspected.  Weakness EXAM: MRI CERVICAL SPINE WITHOUT AND WITH CONTRAST TECHNIQUE: Multiplanar and multiecho pulse sequences of the cervical spine, to include the craniocervical junction and cervicothoracic junction, were obtained without and with intravenous contrast. CONTRAST:  10mL GADAVIST GADOBUTROL 1 MMOL/ML IV SOLN COMPARISON:  None. FINDINGS: Technical Note: Despite efforts by the technologist and patient, motion artifact is present on today's exam and could not be eliminated. This reduces exam sensitivity and specificity. Alignment: Physiologic. Vertebrae: No fracture, evidence of discitis, or bone lesion. Cord: Normal signal and morphology. Posterior Fossa, vertebral arteries, paraspinal tissues: Negative. Disc levels: C2-C3: No significant disc protrusion, foraminal stenosis, or canal stenosis. C3-C4: Minimal disc bulge with right sided uncovertebral spurring. No foraminal or canal stenosis. C4-C5: No disc bulge. Minimal right uncovertebral spurring. No foraminal or canal stenosis. C5-C6: No disc bulge. Minimal right uncovertebral spurring. No foraminal or canal stenosis. C6-C7: Shallow central disc protrusion. No foraminal or canal stenosis. C7-T1: No significant disc protrusion, foraminal stenosis, or canal stenosis. IMPRESSION: 1. No acute findings.  No abnormal cord  signal. 2. Early mild cervical spondylosis. No significant foraminal or canal stenosis at any level. Electronically Signed   By: Duanne Guess D.O.   On: 05/26/2021 14:29   MR THORACIC SPINE W WO CONTRAST  Result Date: 05/26/2021 CLINICAL DATA:  Trunk numbness or tingling weakness in both legs, unable to walk EXAM: MRI THORACIC WITHOUT AND WITH CONTRAST TECHNIQUE: Multiplanar and multiecho pulse sequences of the thoracic spine were obtained without and with intravenous contrast. CONTRAST:  10mL GADAVIST GADOBUTROL 1 MMOL/ML IV SOLN COMPARISON:  None. FINDINGS: Alignment:  Physiologic. Vertebrae: No fracture, evidence of discitis, or bone lesion. Cord:  Normal signal and morphology. Paraspinal and other soft tissues: Negative. Disc levels: T1-T2: No significant canal or neural foraminal narrowing. T2-T3: No significant canal or neural foraminal narrowing. T3-T4: No significant canal or  neural foraminal narrowing. T4-T5: No significant canal or neural foraminal narrowing. T5-T6: Shallow central disc protrusion. No significant canal or neural foraminal narrowing. T6-T7: No significant canal or neural foraminal narrowing. T7-T8: No significant canal or neural foraminal narrowing. T8-T9: No disc protrusion. Mild bilateral facet arthropathy. Borderline-mild bilateral foraminal stenosis. No canal stenosis. T9-T10: No disc protrusion. Mild bilateral facet arthropathy. Mild bilateral foraminal stenosis. No canal stenosis. T10-T11: No disc protrusion. Mild bilateral facet arthropathy. Mild bilateral foraminal stenosis. No canal stenosis. T11-T12: No significant canal or neural foraminal narrowing. IMPRESSION: 1. No acute findings. No evidence of cord compression or abnormal cord signal. 2. Mild degenerative changes of the thoracic spine with mild bilateral foraminal stenosis at T9-T10 and T10-T11. No canal stenosis at any level. Electronically Signed   By: Duanne Guess D.O.   On: 05/26/2021 14:15   MR Lumbar  Spine W Wo Contrast  Result Date: 05/26/2021 CLINICAL DATA:  Low back pain, cauda equina syndrome suspected Myelopathy, acute, lumbar spine weakness in both legs, can not walk EXAM: MRI LUMBAR SPINE WITHOUT AND WITH CONTRAST TECHNIQUE: Multiplanar and multiecho pulse sequences of the lumbar spine were obtained without and with intravenous contrast. CONTRAST:  79mL GADAVIST GADOBUTROL 1 MMOL/ML IV SOLN COMPARISON:  None. FINDINGS: Segmentation:  Standard. Alignment: Mild lumbar levocurvature. Slight retrolisthesis of L3 on L4. Vertebrae:  No fracture, evidence of discitis, or bone lesion. Conus medullaris and cauda equina: Conus extends to the T12 level. Conus and cauda equina appear normal. Paraspinal and other soft tissues: Negative. Disc levels: T12-L1: Unremarkable disc. Mild bilateral facet hypertrophy. No foraminal or canal stenosis. L1-L2: Minimal annular disc bulge. Mild bilateral facet arthropathy. No foraminal or canal stenosis. L2-L3: Diffuse disc bulge with right foraminal disc protrusion. Mild bilateral facet arthropathy. Moderate canal stenosis. No significant foraminal stenosis. L3-L4: Diffuse disc bulge with central disc protrusion. Moderate bilateral facet arthropathy. Severe canal stenosis with mild bilateral foraminal stenosis. L4-L5: Diffuse disc bulge, eccentric to the left. Moderate bilateral facet arthropathy. Findings result in mild-to-moderate canal stenosis with moderate-severe left foraminal stenosis. L5-S1: Mild central disc protrusion. Moderate bilateral facet hypertrophy. No foraminal or canal stenosis. IMPRESSION: 1. Multilevel lumbar spondylosis, as detailed above. 2. Severe canal stenosis at L3-L4. 3. Moderate canal stenosis at L2-L3. 4. Mild-to-moderate canal stenosis with moderate-severe left foraminal stenosis at L4-L5. Electronically Signed   By: Duanne Guess D.O.   On: 05/26/2021 14:21               LOS: 2 days   Tanazia Achee  [facility]   Pager on  [email or web address]. If 7PM-7AM, please contact night-coverage at [email or web address]     05/28/2021, 12:48 PM

## 2021-05-28 NOTE — Evaluation (Signed)
Physical Therapy Evaluation Patient Details Name: Jerry Wheeler MRN: [id number] DOB: 1976-01-08 Today's Date: 05/28/2021  History of Present Illness  Jerry Wheeler is a 45 y.o. male with medical history significant for morbid obesity, diabetes mellitus, bipolar disorder and hypertension who presents to the emergency room for evaluation of weakness and inability to ambulate.  Patient states that he has peripheral neuropathy related to his underlying diabetes mellitus last 3 weeks he has had progressive lower extremity and 1 day prior to his admission he fell and was able to stand up.  He states that his balance is off and he is able to ambulate without holding onto something.  He denies having any back pain and denies having any urinary or fecal incontinence.  He denies having any fever, no chills, no cough or congestion.  He denies having any recent viral infection.    Clinical Impression  Pt very pleasant 45 y.o. presenting with significant weakness in BLE's, specifically during longer bouts of exercise/ambulation.  Pt notes that his weakness has been ongoing for the past several weeks.  Pt is able to perform bed mobility but relies heavily on bed-rails when coming upright.  Pt is able to ambulate ~50 feet before needing to return to room.  Pt relies heavily on UE's to assist with ambulation and pushing through walker, due to weakness in the LE's.  Pt demonstrates decreased proprioception in B feet and light touch as well.  Due to inability to perform stair training and lack of significant walking distance, pt may benefit from inpatient rehab if approved.  Otherwise, due to living arrangements, pt will need modifications made for [facility] to be placed on bottom level and someone to assist him while living at home.  Pt will benefit from skilled PT intervention to increase independence and safety with basic mobility in preparation for discharge to the venue listed below.        Recommendations for  follow up therapy are one component of a multi-disciplinary discharge planning process, led by the attending physician.  Recommendations may be updated based on patient status, additional functional criteria and insurance authorization.  Follow Up Recommendations Acute inpatient rehab (3hours/day)    Assistance Recommended at Discharge Intermittent Supervision/Assistance  Functional Status Assessment Patient has had a recent decline in their functional status and demonstrates the ability to make significant improvements in function in a reasonable and predictable amount of time.  Equipment Recommendations  Rolling walker (2 wheels);BSC/3in1    Recommendations for Other Services       Precautions / Restrictions Precautions Precautions: Fall Restrictions Weight Bearing Restrictions: No      Mobility  Bed Mobility Overal bed mobility: Modified Independent                  Transfers Overall transfer level: Needs assistance Equipment used: Rolling walker (2 wheels) Transfers: Sit to/from Stand Sit to Stand: Min guard           General transfer comment: Increased effort with sit to stand, cues for hand placement.    Ambulation/Gait Ambulation/Gait assistance: Min guard Gait Distance (Feet): 50 Feet Assistive device: Rolling walker (2 wheels) Gait Pattern/deviations: WFL(Within Functional Limits) Gait velocity: decreased     General Gait Details: Pt able to ambulate well with fatigue noted by pt.  Pt does externally rotate LE's with L>R when ambulating.  Pt notes he feels more secure with walker at this time.  Stairs            Psychologist, prison and probation services  Modified Rankin (Stroke Patients Only)       Balance Overall balance assessment: Needs assistance;History of Falls Sitting-balance support: Feet supported Sitting balance-Leahy Scale: Good     Standing balance support: Bilateral upper extremity supported Standing balance-Leahy Scale: Fair                                Pertinent Vitals/Pain      Home Living Family/patient expects to be discharged to:: Private residence Living Arrangements: Alone Available Help at Discharge: Family Type of Home: Apartment       Alternate Level Stairs-Number of Steps: 14 Home Layout: Two level;Bed/bath upstairs Home Equipment: None      Prior Function Prior Level of Function : Independent/Modified Independent;Driving             Mobility Comments: Pt reports he was previously independent with ambulation, reports decreased balance over the last 3 weeks, does not recall any events that would have affected his balance.  Reports his bedroom and only bathroom are located upstairs, reports he does well if he has a handrail which he does have going up and down the stairs however reports when stepping down off his porch there is no handrail. ADLs Comments: Pt reports being independent prior with self care and IADL tasks.  He does not work and is currently on disability.  He reports decreased endurance over the last 3 weeks and progressive leg weakness when attempting to perform tasks in standing.  Pt has bilateral LE neuropathy and appears to demonstrate decreased proprioception when vision is occluded (for example when drying his hair off with towel and towel is covering his eyes.  Pt has bilateral tremors in his UEs and reports he has had this for years.     Hand Dominance   Dominant Hand: Right    Extremity/Trunk Assessment                Communication   Communication: No difficulties  Cognition                                                General Comments      Exercises     Assessment/Plan    PT Assessment Patient needs continued PT services  PT Problem List Decreased strength;Decreased activity tolerance;Decreased balance;Decreased mobility;Decreased knowledge of use of DME;Decreased safety awareness       PT Treatment Interventions DME  instruction;Gait training;Stair training;Functional mobility training;Therapeutic activities;Therapeutic exercise;Balance training;Neuromuscular re-education    PT Goals (Current goals can be found in the Care Plan section)  Acute Rehab PT Goals Patient Stated Goal: to get stronger. PT Goal Formulation: With patient Time For Goal Achievement: 06/11/21 Potential to Achieve Goals: Good    Frequency Min 3X/week   Barriers to discharge Inaccessible home environment Bedroom/Bathroom located on second floor and pt is not able to attempt those steps at this time.    Co-evaluation               AM-PAC PT "6 Clicks" Mobility  Outcome Measure Help needed turning from your back to your side while in a flat bed without using bedrails?: A Little Help needed moving from lying on your back to sitting on the side of a flat bed without using bedrails?: A Little Help needed moving to and from  a bed to a chair (including a wheelchair)?: A Little Help needed standing up from a chair using your arms (e.g., wheelchair or bedside chair)?: A Little Help needed to walk in hospital room?: A Lot Help needed climbing 3-5 steps with a railing? : Total 6 Click Score: 15    End of Session Equipment Utilized During Treatment: Gait belt Activity Tolerance: Patient tolerated treatment well;Patient limited by fatigue Patient left: in bed;with call bell/phone within reach Nurse Communication: Mobility status PT Visit Diagnosis: Unsteadiness on feet (R26.81);Other abnormalities of gait and mobility (R26.89);Muscle weakness (generalized) (M62.81);Difficulty in walking, not elsewhere classified (R26.2);Other symptoms and signs involving the nervous system (R29.898)    Time: 4599-7741 PT Time Calculation (min) (ACUTE ONLY): 30 min   Charges:   PT Evaluation $PT Eval Low Complexity: 1 Low PT Treatments $Gait Training: 8-22 mins $Therapeutic Activity: 8-22 mins        Nolon Bussing, PT, DPT 05/28/21,  1:51 PM   Phineas Real 05/28/2021, 1:41 PM

## 2021-05-29 LAB — GLUCOSE, CAPILLARY
Glucose-Capillary: 86 mg/dL (ref 70–99)
Glucose-Capillary: 197 mg/dL — ABNORMAL HIGH (ref 70–99)
Glucose-Capillary: 201 mg/dL — ABNORMAL HIGH (ref 70–99)
Glucose-Capillary: 129 mg/dL — ABNORMAL HIGH (ref 70–99)

## 2021-05-29 LAB — BASIC METABOLIC PANEL
Anion gap: 7 (ref 5–15)
BUN: 14 mg/dL (ref 6–20)
CO2: 26 mmol/L (ref 22–32)
Calcium: 10.6 mg/dL — ABNORMAL HIGH (ref 8.9–10.3)
Chloride: 105 mmol/L (ref 98–111)
Creatinine, Ser: 0.91 mg/dL (ref 0.61–1.24)
GFR, Estimated: >60 mL/min (ref >60)
Glucose, Bld: 200 mg/dL — ABNORMAL HIGH (ref 70–99)
Potassium: 4.1 mmol/L (ref 3.5–5.1)
Sodium: 138 mmol/L (ref 135–145)

## 2021-05-29 MED ORDER — METOCLOPRAMIDE HCL 5 MG/ML IJ SOLN
10.0000 mg | Freq: Four times a day (QID) | INTRAMUSCULAR | Status: AC | PRN
  Administered 2021-05-29: 10 mg via INTRAVENOUS
  Filled 2021-05-29: qty 2

## 2021-05-29 NOTE — Progress Notes (Addendum)
Progress Note    Jerry Wheeler  WER:[id number] DOB: 15-Apr-1976  DOA: 05/26/2021 PCP: Marisue Ivan, MD      Brief Narrative:    Medical records reviewed and are as summarized below:  Jerry Wheeler is a 45 y.o. male with medical history significant for type II DM, peripheral neuropathy, hypertension, bipolar disorder, who presented to the hospital because of progressive generalized weakness and inability to ambulate.  He fell the day prior to admission and was unable to stand up.      Assessment/Plan:   Principal Problem:   Lower extremity weakness Active Problems:   Bipolar disorder (HCC)   DM type 2 with diabetic peripheral neuropathy (HCC)   Recurrent major depressive disorder, in partial remission (HCC)   Obesity (BMI 35.0-39.9 without comorbidity)   Unsteady gait   Fall at home, initial encounter    Body mass index is 38.87 kg/m.  (Morbid obesity)   Unsteady gait/lower extremity weakness, s/p fall at home: Suspected to be from peripheral neuropathy.  PT and OT who recommended discharge to inpatient rehab center.  Awaiting evaluation by physiatrist.  Lumbar puncture yielded a bloody tap but no need for repeat LP per neurologist.  Chronic lumbar stenosis from L2-L4: He has been evaluated by the neurosurgeon.  No plan for surgical intervention.  Outpatient follow-up with neurosurgeon for further management.  Occasional episodes of nausea and vomiting: Continue IV Zofran as needed.  Add IV metoclopramide.  Recommended outpatient evaluation for gastroparesis.  Type II DM with hyperglycemia and peripheral neuropathy: Continue concentrated regular insulin and monitor glucose levels.  He recently saw his endocrinologist in the office.  Continue outpatient follow-up with endocrinologist.  Elevated vitamin B12 level: Red and B12 supplements have been discontinued.  Outpatient follow-up with PCP  Bipolar disorder: Continue psychotropics  Hypertension:  Continue amlodipine   Diet Order             Diet heart healthy/carb modified Room service appropriate? Yes; Fluid consistency: Thin  Diet effective now                      Consultants: Neurologist Neurosurgeon  Procedures: Lumbar puncture with bloody tap    Medications:    amLODipine  5 mg Oral Daily   atorvastatin  40 mg Oral Daily   cariprazine  3 mg Oral Daily   fenofibrate  160 mg Oral Daily   insulin regular human CONCENTRATED  85 Units Subcutaneous TID WC   lamoTRIgine  150 mg Oral BID   lithium carbonate  300 mg Oral BID   sodium chloride flush  3 mL Intravenous Q12H   Continuous Infusions:  sodium chloride       Anti-infectives (From admission, onward)    None              Family Communication/Anticipated D/C date and plan/Code Status   DVT prophylaxis: SCDs Start: 05/26/21 1110     Code Status: Full Code  Family Communication: None Disposition Plan: Possible discharge to inpatient rehab  Status is: Inpatient  Remains inpatient appropriate because: Unsafe discharge plan           Subjective:   C/o unsteady gait and he is worried about falling.  He lives alone at home.  He had nausea and vomited once this morning.  This was associated with some headache.  He said that he had recurrent episodes of nausea and vomiting that occur randomly in the past.  Objective:  Vitals:   05/28/21 1604 05/29/21 0036 05/29/21 0453 05/29/21 0750  BP: (!) 149/80 139/83 (!) 149/88 138/88  Pulse: 84 78 83 82  Resp: 17 18 16 17   Temp: 98.7 F (37.1 C) 98.2 F (36.8 C) 97.8 F (36.6 C) 98.5 F (36.9 C)  TempSrc:    Oral  SpO2: 98% 98% 96% 97%  Weight:      Height:       No data found.    Intake/Output Summary (Last 24 hours) at 05/29/2021 1048 Last data filed at 05/29/2021 0754 Gross per 24 hour  Intake 480 ml  Output --  Net 480 ml   Filed Weights   05/28/21 0427  Weight: (!) 156.5 kg    Exam:  GEN: NAD SKIN:  No rash EYES: EOMI ENT: MMM CV: RRR PULM: CTA B ABD: soft, obese, NT, +BS CNS: AAO x 3, non focal EXT: No edema or tenderness         Data Reviewed:   I have personally reviewed following labs and imaging studies:  Labs: Labs show the following:   Basic Metabolic Panel: Recent Labs  Lab 05/26/21 0033 05/27/21 0646  NA 138 137  K 3.5 3.7  CL 108 106  CO2 23 25  GLUCOSE 170* 227*  BUN 17 11  CREATININE 1.04 0.84  CALCIUM 10.2 9.7   GFR Estimated Creatinine Clearance: 182.7 mL/min (by C-G formula based on SCr of 0.84 mg/dL). Liver Function Tests: No results for input(s): AST, ALT, ALKPHOS, BILITOT, PROT, ALBUMIN in the last 168 hours. No results for input(s): LIPASE, AMYLASE in the last 168 hours. No results for input(s): AMMONIA in the last 168 hours. Coagulation profile No results for input(s): INR, PROTIME in the last 168 hours.  CBC: Recent Labs  Lab 05/26/21 0033 05/27/21 0646  WBC 9.6 6.4  HGB 13.6 13.3  HCT 42.2 42.1  MCV 85.8 85.4  PLT 328 266   Cardiac Enzymes: No results for input(s): CKTOTAL, CKMB, CKMBINDEX, TROPONINI in the last 168 hours. BNP (last 3 results) No results for input(s): PROBNP in the last 8760 hours. CBG: Recent Labs  Lab 05/28/21 0458 05/28/21 0737 05/28/21 1114 05/28/21 1605 05/29/21 0753  GLUCAP 98 126* 232* 265* 86   D-Dimer: No results for input(s): DDIMER in the last 72 hours. Hgb A1c: No results for input(s): HGBA1C in the last 72 hours. Lipid Profile: No results for input(s): CHOL, HDL, LDLCALC, TRIG, CHOLHDL, LDLDIRECT in the last 72 hours. Thyroid function studies: No results for input(s): TSH, T4TOTAL, T3FREE, THYROIDAB in the last 72 hours.  Invalid input(s): FREET3 Anemia work up: Recent Labs    05/27/21 0646  VITAMINB12 1,415*   Sepsis Labs: Recent Labs  Lab 05/26/21 0033 05/27/21 0646  WBC 9.6 6.4    Microbiology Recent Results (from the past 240 hour(s))  Resp Panel by RT-PCR (Flu  A&B, Covid) Nasopharyngeal Swab     Status: None   Collection Time: 05/26/21  4:29 AM   Specimen: Nasopharyngeal Swab; Nasopharyngeal(NP) swabs in vial transport medium  Result Value Ref Range Status   SARS Coronavirus 2 by RT PCR NEGATIVE NEGATIVE Final    Comment: (NOTE) SARS-CoV-2 target nucleic acids are NOT DETECTED.  The SARS-CoV-2 RNA is generally detectable in upper respiratory specimens during the acute phase of infection. The lowest concentration of SARS-CoV-2 viral copies this assay can detect is 138 copies/mL. A negative result does not preclude SARS-Cov-2 infection and should not be used as the sole basis for treatment  or other patient management decisions. A negative result may occur with  improper specimen collection/handling, submission of specimen other than nasopharyngeal swab, presence of viral mutation(s) within the areas targeted by this assay, and inadequate number of viral copies(<138 copies/mL). A negative result must be combined with clinical observations, patient history, and epidemiological information. The expected result is Negative.  Fact Sheet for Patients:  BloggerCourse.com  Fact Sheet for Healthcare Providers:  SeriousBroker.it  This test is no t yet approved or cleared by the [place] FDA and  has been authorized for detection and/or diagnosis of SARS-CoV-2 by FDA under an Emergency Use Authorization (EUA). This EUA will remain  in effect (meaning this test can be used) for the duration of the COVID-19 declaration under Section 564(b)(1) of the Act, 21 U.S.C.section 360bbb-3(b)(1), unless the authorization is terminated  or revoked sooner.       Influenza A by PCR NEGATIVE NEGATIVE Final   Influenza B by PCR NEGATIVE NEGATIVE Final    Comment: (NOTE) The Xpert Xpress SARS-CoV-2/FLU/RSV plus assay is intended as an aid in the diagnosis of influenza from Nasopharyngeal swab specimens  and should not be used as a sole basis for treatment. Nasal washings and aspirates are unacceptable for Xpert Xpress SARS-CoV-2/FLU/RSV testing.  Fact Sheet for Patients: BloggerCourse.com  Fact Sheet for Healthcare Providers: SeriousBroker.it  This test is not yet approved or cleared by the [place] FDA and has been authorized for detection and/or diagnosis of SARS-CoV-2 by FDA under an Emergency Use Authorization (EUA). This EUA will remain in effect (meaning this test can be used) for the duration of the COVID-19 declaration under Section 564(b)(1) of the Act, 21 U.S.C. section 360bbb-3(b)(1), unless the authorization is terminated or revoked.  Performed at [facility] [place], [address] [place], [place] [address]     Procedures and diagnostic studies:  No results found.             LOS: 3 days   Jerry Wheeler  [facility]   Pager on [email or web address]. If 7PM-7AM, please contact night-coverage at [email or web address]     05/29/2021, 10:48 AM

## 2021-05-29 NOTE — Progress Notes (Signed)
Inpatient Rehab Admissions Coordinator:   Per therapy recommendations  patient was screened for CIR candidacy by Megan Salon, MS, CCC-SLP .At this time, Pt. Appears to be a potential candidate for CIR. I will place  order for rehab consult per protocol for full assessment. Please contact me any with questions.  Megan Salon, MS, CCC-SLP Rehab Admissions Coordinator  [phone] (celll) [phone] (office)

## 2021-05-29 NOTE — Progress Notes (Signed)
Inpatient Rehab Admissions Coordinator:   I spoke to Pt.  Regarding potential CIR admit and he states interest and that his brother can provide support at discharge. I will follow and pursue for admit pending bed availability.  Megan Salon, MS, CCC-SLP Rehab Admissions Coordinator  [phone] (celll) [phone] (office)

## 2021-05-29 NOTE — Care Management Important Message (Signed)
Important Message  Patient Details  Name: Jerry Wheeler MRN: [id number] Date of Birth: 25-Sep-1975   Medicare Important Message Given:  N/A - LOS <3 / Initial given by admissions     Johnell Comings 05/29/2021, 9:58 AM

## 2021-05-29 NOTE — Progress Notes (Signed)
Occupational Therapy Treatment Patient Details Name: Jerry Wheeler MRN: [id number] DOB: 02-08-1976 Today's Date: 05/29/2021   History of present illness Jerry Wheeler is a 45 y.o. male with medical history significant for morbid obesity, diabetes mellitus, bipolar disorder and hypertension who presents to the emergency room for evaluation of weakness and inability to ambulate.  Patient states that he has peripheral neuropathy related to his underlying diabetes mellitus last 3 weeks he has had progressive lower extremity and 1 day prior to his admission he fell and was able to stand up.  He states that his balance is off and he is able to ambulate without holding onto something.  He denies having any back pain and denies having any urinary or fecal incontinence.  He denies having any fever, no chills, no cough or congestion.  He denies having any recent viral infection.   OT comments  Pt seen for OT treatment on this date. Upon arrival to room, pt awake and seated upright in bed. Pt very motivated to participate in OT tx. Pt currently presents with decreased standing balance and decreased activity tolerance. Due to these functional impairments, pt requires MIN A for sit<>stand transfers, MIN GUARD for functional mobility of short household distances (36ft) with RW, MIN A for steadying during standing hand hygiene, and MIN A for managing sleeves while applying deodorant in standing. Pt engaged in x10 reps of diagonal shoulder flexion of b/l UE while standing in an effort to promote improved balance in preparation for IADL management (e.g., putting dishes/laundry away). After standing tasks/therapeutic exercises, pt became tremulous and reported fatigue. Once seated, pt provided with HEP for seated UB/LB exercises, verbalizing and demonstrating good understanding. Pt is making good progress toward goals and continues to benefit from skilled OT services to maximize return to PLOF and minimize risk of  future falls, injury, caregiver burden, and readmission. Will continue to follow POC. Discharge recommendation remains appropriate.     Recommendations for follow up therapy are one component of a multi-disciplinary discharge planning process, led by the attending physician.  Recommendations may be updated based on patient status, additional functional criteria and insurance authorization.    Follow Up Recommendations  Acute inpatient rehab (3hours/day)    Assistance Recommended at Discharge Frequent or constant Supervision/Assistance  Equipment Recommendations  BSC/3in1;Tub/shower bench       Precautions / Restrictions Precautions Precautions: Fall Restrictions Weight Bearing Restrictions: No       Mobility Bed Mobility Overal bed mobility: Modified Independent             General bed mobility comments: heavy use of bed rails and head of bed was slightly elevated    Transfers Overall transfer level: Needs assistance Equipment used: Rolling walker (2 wheels) Transfers: Sit to/from Stand Sit to Stand: Min assist           General transfer comment: MIN A for steadying when upright     Balance Overall balance assessment: Needs assistance Sitting-balance support: No upper extremity supported;Feet supported Sitting balance-Leahy Scale: Normal Sitting balance - Comments: Good sitting balance reaching outside BOS to grab shoes from floor   Standing balance support: Bilateral upper extremity supported;Reliant on assistive device for balance;During functional activity Standing balance-Leahy Scale: Fair                             ADL either performed or assessed with clinical judgement   ADL Overall ADL's : Needs assistance/impaired  Grooming: Wash/dry hands;Applying deodorant;Minimal assistance;Standing Grooming Details (indicate cue type and reason): With forearms resting on sink, pt required MIN A for steadying during hand washing. While standing  with unilateral UE support from RW, pt required MIN A for managing sleeves when applying deodorant             Lower Body Dressing: Min guard;Sitting/lateral leans Lower Body Dressing Details (indicate cue type and reason): To grab shoes from floor and don/doff shoes while seated EOB             Functional mobility during ADLs: Min guard;Rolling walker (2 wheels) (to walk 20 ft)        Cognition Arousal/Alertness: Awake/alert Behavior During Therapy: WFL for tasks assessed/performed Overall Cognitive Status: Within Functional Limits for tasks assessed                                 General Comments: patient able to follow all commands without difficulty          Exercises Exercises: General Lower Extremity;Other exercises General Exercises - Upper Extremity Shoulder Horizontal ABduction: AROM;Strengthening;Both;Seated Shoulder Horizontal ADduction: AROM;Strengthening;Both;10 reps;Seated Elbow Flexion: AROM;Strengthening;Both;20 reps;Seated General Exercises - Lower Extremity Ankle Circles/Pumps: AROM;Strengthening;Both;10 reps;Seated Long Arc Quad: AROM;Strengthening;Both;10 reps;Seated  Other Exercises Other Exercises: Pt engaged in x10 reps of diagonal shoulder flexion of b/l UE while standing to promote improved balance for IADL tasks (e.g., putting dishes/laundry away)           Pertinent Vitals/ Pain       Pain Assessment: No/denies pain         Frequency  Min 3X/week        Progress Toward Goals  OT Goals(current goals can now be found in the care plan section)  Progress towards OT goals: Progressing toward goals  Acute Rehab OT Goals Patient Stated Goal: Pt would like to return home and be independent OT Goal Formulation: With patient Time For Goal Achievement: 06/10/21 Potential to Achieve Goals: Good  Plan Discharge plan remains appropriate;Frequency remains appropriate       AM-PAC OT "6 Clicks" Daily Activity      Outcome Measure   Help from another person eating meals?: None Help from another person taking care of personal grooming?: A Little Help from another person toileting, which includes using toliet, bedpan, or urinal?: A Little Help from another person bathing (including washing, rinsing, drying)?: A Little Help from another person to put on and taking off regular upper body clothing?: None Help from another person to put on and taking off regular lower body clothing?: None 6 Click Score: 21    End of Session Equipment Utilized During Treatment: Rolling walker (2 wheels);Gait belt  OT Visit Diagnosis: Unsteadiness on feet (R26.81);History of falling (Z91.81);Muscle weakness (generalized) (M62.81)   Activity Tolerance Patient tolerated treatment well   Patient Left in bed;with call bell/phone within reach;with bed alarm set   Nurse Communication Mobility status        Time: 1535-1600 OT Time Calculation (min): 25 min  Charges: OT General Charges $OT Visit: 1 Visit OT Treatments $Self Care/Home Management : 8-22 mins $Therapeutic Activity: 8-22 mins  Matthew Folks, OTR/L [facility] [phone]

## 2021-05-29 NOTE — Progress Notes (Addendum)
Physical Therapy Treatment Patient Details Name: Jerry Wheeler MRN: [id number] DOB: [birth year] Today's Date: 05/29/2021   History of Present Illness Jerry Wheeler is a 45 y.o. male with medical history significant for morbid obesity, diabetes mellitus, bipolar disorder and hypertension who presents to the emergency room for evaluation of weakness and inability to ambulate.  Patient states that he has peripheral neuropathy related to his underlying diabetes mellitus last 3 weeks he has had progressive lower extremity and 1 day prior to his admission he fell and was able to stand up.  He states that his balance is off and he is able to ambulate without holding onto something.  He denies having any back pain and denies having any urinary or fecal incontinence.  He denies having any fever, no chills, no cough or congestion.  He denies having any recent viral infection.    PT Comments    Patient agreeable to PT. He reports having a headache earlier that has resolved with medication and a bouts of nausea/vomiting earlier. He required assistance with transfers today due to posterior lean that required facilitation for anterior weight shifting. Fair standing balance with rolling walker for support, but patient reports feeling mild lightheadedness with upright activity. He ambulated a short distance in the room, activity tolerance limited overall. Recommend to continue PT to maximize independence and facilitate return to prior level of function. He is still not at his baseline level of mobility.    Recommendations for follow up therapy are one component of a multi-disciplinary discharge planning process, led by the attending physician.  Recommendations may be updated based on patient status, additional functional criteria and insurance authorization.  Follow Up Recommendations  Acute inpatient rehab (3hours/day)     Assistance Recommended at Discharge Intermittent Supervision/Assistance   Equipment Recommendations  Rolling walker (2 wheels);BSC/3in1    Recommendations for Other Services       Precautions / Restrictions Precautions Precautions: Fall Restrictions Weight Bearing Restrictions: No     Mobility  Bed Mobility Overal bed mobility: Modified Independent             General bed mobility comments: heavy use of bed rails and head of bed was elevated    Transfers Overall transfer level: Needs assistance Equipment used: Rolling walker (2 wheels) Transfers: Sit to/from Stand Sit to Stand: Min assist           General transfer comment: patient has posterior loss of balance during sit to stand transfer. facilitation for forward weight shifting    Ambulation/Gait Ambulation/Gait assistance: Min guard Gait Distance (Feet): 20 Feet Assistive device: Rolling walker (2 wheels) Gait Pattern/deviations: Decreased stride length (external rotation bilaterally) Gait velocity: decreased     General Gait Details: patient fatigued with activity, feels mildly lightheaded with upright activity. cues for safety using rolling walker. blood pressure 146/85 after walking with heart rate 93 bpm.   Stairs             Wheelchair Mobility    Modified Rankin (Stroke Patients Only)       Balance Overall balance assessment: Needs assistance Sitting-balance support: Feet supported Sitting balance-Leahy Scale: Good     Standing balance support: Bilateral upper extremity supported;Reliant on assistive device for balance Standing balance-Leahy Scale: Fair                              Cognition Arousal/Alertness: Awake/alert Behavior During Therapy: WFL for tasks assessed/performed Overall Cognitive Status: Within Functional  Limits for tasks assessed                                 General Comments: patient able to follow all commands without difficulty        Exercises General Exercises - Lower Extremity Ankle  Circles/Pumps: AROM;Strengthening;Both;10 reps;Supine Straight Leg Raises: Strengthening;Both;AAROM;10 reps;Supine Other Exercises Other Exercises: verbal cues for exercise technique for strengthening.    General Comments General comments (skin integrity, edema, etc.): patient reports having mild dizziness with head turn to the right.      Pertinent Vitals/Pain Pain Assessment: No/denies pain (he reports having a headache earlier that has resolved with medication)    Home Living                          Prior Function            PT Goals (current goals can now be found in the care plan section) Acute Rehab PT Goals Patient Stated Goal: to get stronger. PT Goal Formulation: With patient Time For Goal Achievement: 06/11/21 Potential to Achieve Goals: Good Progress towards PT goals: Progressing toward goals    Frequency    Min 3X/week      PT Plan Current plan remains appropriate    Co-evaluation              AM-PAC PT "6 Clicks" Mobility   Outcome Measure  Help needed turning from your back to your side while in a flat bed without using bedrails?: A Little Help needed moving from lying on your back to sitting on the side of a flat bed without using bedrails?: A Little Help needed moving to and from a bed to a chair (including a wheelchair)?: A Little Help needed standing up from a chair using your arms (e.g., wheelchair or bedside chair)?: A Little Help needed to walk in hospital room?: A Lot Help needed climbing 3-5 steps with a railing? : Total 6 Click Score: 15    End of Session Equipment Utilized During Treatment: Gait belt Activity Tolerance: Patient tolerated treatment well Patient left: in bed;with call bell/phone within reach   PT Visit Diagnosis: Unsteadiness on feet (R26.81);Other abnormalities of gait and mobility (R26.89);Muscle weakness (generalized) (M62.81);Difficulty in walking, not elsewhere classified (R26.2);Other symptoms and  signs involving the nervous system (R29.898)     Time: 1253-1316 PT Time Calculation (min) (ACUTE ONLY): 23 min  Charges:  $Therapeutic Exercise: 8-22 mins $Therapeutic Activity: 8-22 mins                     Jerry Wheeler, PT, MPT    Jerry Wheeler 05/29/2021, 1:25 PM

## 2021-05-29 NOTE — Plan of Care (Signed)
  Problem: Education: Goal: Knowledge of General Education information will improve Description Including pain rating scale, medication(s)/side effects and non-pharmacologic comfort measures Outcome: Progressing   Problem: Health Behavior/Discharge Planning: Goal: Ability to manage health-related needs will improve Outcome: Progressing   

## 2021-05-29 NOTE — Progress Notes (Signed)
   Progress Note   Date: 05/29/2021  Subjective: [name] appears slightly improved in terms of his exam.  He is sitting up in the chair and did ambulate with physical therapy.  He does still state that he has significant balance difficulty.  Has his baseline weakness and numbness in his feet.  He otherwise does not endorse any significant neurologic changes but today he is having nausea and emesis.  Vital Signs: Temp:  [97.8 F (36.6 C)-98.7 F (37.1 C)] 98.4 F (36.9 C) (12/12 1133) Pulse Rate:  [78-88] 88 (12/12 1133) Resp:  [16-18] 18 (12/12 1133) BP: (138-159)/(80-92) 159/92 (12/12 1133) SpO2:  [96 %-100 %] 100 % (12/12 1133) Temp (24hrs), Avg:98.3 F (36.8 C), Min:97.8 F (36.6 C), Max:98.7 F (37.1 C)  Weight: (!) 156.5 kg (standing)   Problem List Patient Active Problem List   Diagnosis Date Noted   Unsteady gait 05/27/2021   Fall at home, initial encounter 05/27/2021   Lower extremity weakness 05/26/2021   Obesity (BMI 35.0-39.9 without comorbidity) 05/26/2021   Anxiety 09/12/2017   Bipolar disorder (HCC) 09/12/2017   Insulin dependent diabetes mellitus 09/12/2017   Primary insomnia 09/12/2017   Pure hypercholesterolemia 09/12/2017   Recurrent major depressive disorder, in partial remission (HCC) 09/12/2017   Tobacco dependence 09/12/2017   Vitamin D deficiency 09/12/2017   DM type 2 with diabetic peripheral neuropathy (HCC) 04/06/2016   Hypercalcemia 09/13/2015   Hypogonadism in male 09/13/2015   Non morbid obesity due to excess calories 09/13/2015    Medications: Scheduled Meds:  amLODipine  5 mg Oral Daily   atorvastatin  40 mg Oral Daily   cariprazine  3 mg Oral Daily   fenofibrate  160 mg Oral Daily   insulin regular human CONCENTRATED  85 Units Subcutaneous TID WC   lamoTRIgine  150 mg Oral BID   lithium carbonate  300 mg Oral BID   sodium chloride flush  3 mL Intravenous Q12H   Continuous Infusions:  sodium chloride     PRN Meds:.sodium  chloride, acetaminophen **OR** acetaminophen, ibuprofen, metoCLOPramide (REGLAN) injection, ondansetron **OR** ondansetron (ZOFRAN) IV, sodium chloride flush  Labs:  Lab Results  Component Value Date   WBC 6.4 05/27/2021   WBC 9.6 05/26/2021   HCT 42.1 05/27/2021   HCT 42.2 05/26/2021   PLT 266 05/27/2021   PLT 328 05/26/2021   No results found for: LABPT, INR, APTT  Lab Results  Component Value Date   NA 138 05/29/2021   NA 137 05/27/2021   K 4.1 05/29/2021   K 3.7 05/27/2021   BUN 14 05/29/2021   BUN 11 05/27/2021   No results found for: MG  Exam: Awake, sitting up in chair 5 out of 5 strength throughout his lower extremities with exception of dorsiflexion which is 4 out of 5.  He does have sensation in his feet.  Assessment/plan: We did discuss again the role for surgery for his chronic lumbar stenosis.  I do not think that it would help with his current symptoms and he does endorse that the has had numbness and weakness for some time.  Did discuss elective decompression but he is not interested in surgery at this time.  I did recommend follow-up in our clinic and he will do this.  Recommend continued treatment of his diabetes in case of future surgery   Lucy Chris, MD

## 2021-05-29 NOTE — PMR Pre-admission (Signed)
PMR Admission Coordinator Pre-Admission Assessment  Patient: Jerry Wheeler is an 45 y.o., male MRN: [id number] DOB: 1976/01/01 Height: 6' 7"  (200.7 cm) Weight: (!) 156.5 kg (standing)  Insurance Information HMO:     PPO:      PCP:      IPA:      80/20: yes     OTHER:  PRIMARY: Medicare A and B       Policy#: [id number]      Subscriber: Pt Phone#: Verified online    Fax#:  Pre-Cert#:       Employer:  Benefits:  Phone #:      Name:  Eff. Date: Parts A ad B effective  04/18/1997 Deduct: $1556      Out of Pocket Max:  None      Life Max: N/A  CIR: 100%      SNF: 100 days Outpatient: 80%     Co-Pay: 20% Home Health: 100%      Co-Pay: none DME: 80%     Co-Pay: 20% Providers: patient's choice SECONDARY: none      Policy#:      Phone#:   Development worker, community:       Phone#:   The Engineer, petroleum" for patients in Inpatient Rehabilitation Facilities with attached "Privacy Act [place] Records" was provided and verbally reviewed with: Patient  Emergency Contact Information Contact Information     Name Relation Home Work Mobile   Contact,No  [phone]     Deatley,andrew Brother   [phone]   Tingley,robert Father   [phone]       Current Medical History  Patient Admitting Diagnosis: Lumbar Myelopathy  History of Present Illness: Jerry Wheeler is a 44 y.o. male with medical history significant for morbid obesity, diabetes mellitus, bipolar disorder and hypertension who presented to the emergency room 05/26/21 for evaluation of weakness and inability to ambulate.  Patient states that he has peripheral neuropathy related to his underlying diabetes mellitus last 3 weeks he has had progressive lower extremity and 1 day prior to his admission he fell and was able to stand up.  He states that his balance is off and he is able to ambulate without holding onto something.  He denies having any back pain and denies having any urinary or fecal  incontinence.  He denies having any fever, no chills, no cough or congestion.  He denies having any recent viral infection.On admission, Sodium 138, potassium 3.5, chloride 108, bicarb 23, glucose 170, BUN 17, creatinine 1.04, calcium 10.2, troponin 37, white count 9.6, hemoglobin 13.6, hematocrit 42.2, MCV 85.8, RDW 13.5, platelet count 328, lithium level 0.52. Respiratory viral panel is negative Twelve-lead EKG showed normal sinus rhythm with LVH. MRI of the lumbar spine revealed Multilevel lumbar spondylosis, as detailed above, severe canal stenosis at L3-L4, moderate canal stenosis at L2-L3, and  mild-to-moderate canal stenosis with moderate-severe left foraminal stenosis at L4-L5. Neurosurgery recommended outpatient management, Pt. Declined elective decompression. PT and OT worked with Pt . And recommended CIR to assist return to PLOF.     Patient's medical record from [facility] has been reviewed by the rehabilitation admission coordinator and physician.  Past Medical History  History reviewed. No pertinent past medical history.  Has the patient had major surgery during 100 days prior to admission? No  Family History   family history includes Hypertension in his mother.  Current Medications  Current Facility-Administered Medications:    0.9 %  sodium chloride infusion, 250 mL, Intravenous,  PRN, Collier Bullock, MD   acetaminophen (TYLENOL) tablet 650 mg, 650 mg, Oral, Q6H PRN, 650 mg at 05/29/21 0908 **OR** acetaminophen (TYLENOL) suppository 650 mg, 650 mg, Rectal, Q6H PRN, Agbata, Tochukwu, MD   amLODipine (NORVASC) tablet 5 mg, 5 mg, Oral, Daily, Agbata, Tochukwu, MD, 5 mg at 06/01/21 0856   atorvastatin (LIPITOR) tablet 40 mg, 40 mg, Oral, Daily, Agbata, Tochukwu, MD, 40 mg at 05/31/21 2024   cariprazine (VRAYLAR) capsule 3 mg, 3 mg, Oral, Daily, Jennye Boroughs, MD, 3 mg at 06/01/21 0856   fenofibrate tablet 160 mg, 160 mg, Oral, Daily, Agbata, Tochukwu, MD, 160 mg  at 05/31/21 2024   ibuprofen (ADVIL) tablet 600 mg, 600 mg, Oral, Q8H PRN, Jennye Boroughs, MD, 600 mg at 05/29/21 1218   insulin regular human CONCENTRATED (HUMULIN R) 500 UNIT/ML KwikPen 85 Units, 85 Units, Subcutaneous, TID WC, Jennye Boroughs, MD, 85 Units at 06/01/21 0857   lamoTRIgine (LAMICTAL) tablet 150 mg, 150 mg, Oral, BID, Agbata, Tochukwu, MD, 150 mg at 06/01/21 0856   lithium carbonate (LITHOBID) CR tablet 300 mg, 300 mg, Oral, BID, Agbata, Tochukwu, MD, 300 mg at 06/01/21 0857   ondansetron (ZOFRAN) tablet 4 mg, 4 mg, Oral, Q6H PRN **OR** ondansetron (ZOFRAN) injection 4 mg, 4 mg, Intravenous, Q6H PRN, Agbata, Tochukwu, MD, 4 mg at 05/29/21 1018   sodium chloride flush (NS) 0.9 % injection 3 mL, 3 mL, Intravenous, Q12H, Agbata, Tochukwu, MD, 3 mL at 06/01/21 0903   sodium chloride flush (NS) 0.9 % injection 3 mL, 3 mL, Intravenous, PRN, Agbata, Tochukwu, MD  Patients Current Diet:  Diet Order             Diet heart healthy/carb modified Room service appropriate? Yes; Fluid consistency: Thin  Diet effective now                   Precautions / Restrictions Precautions Precautions: Fall Restrictions Weight Bearing Restrictions: No   Has the patient had 2 or more falls or a fall with injury in the past year? Yes  Prior Activity Level Community (5-7x/wk): Pt. was active in the community PTA  Prior Functional Level Self Care: Did the patient need help bathing, dressing, using the toilet or eating? Independent  Indoor Mobility: Did the patient need assistance with walking from room to room (with or without device)? Independent  Stairs: Did the patient need assistance with internal or external stairs (with or without device)? Independent  Functional Cognition: Did the patient need help planning regular tasks such as shopping or remembering to take medications? Independent  Patient Information Are you of Hispanic, Latino/a,or Spanish origin?: A. No, not of Hispanic,  Latino/a, or Spanish origin What is your race?: A. White Do you need or want an interpreter to communicate with a doctor or health care staff?: 0. No  Patient's Response To:  Health Literacy and Transportation Is the patient able to respond to health literacy and transportation needs?: Yes Health Literacy - How often do you need to have someone help you when you read instructions, pamphlets, or other written material from your doctor or pharmacy?: Never In the past 12 months, has lack of transportation kept you from medical appointments or from getting medications?: No In the past 12 months, has lack of transportation kept you from meetings, work, or from getting things needed for daily living?: No  Home Assistive Devices / Equipment Home Equipment: None  Prior Device Use: Indicate devices/aids used by the patient prior to current illness, exacerbation  or injury? None of the above  Current Functional Level Cognition  Overall Cognitive Status: Within Functional Limits for tasks assessed Orientation Level: Oriented X4 General Comments: Pt is A and O x 4.Cooperative and pleasant. Motivated to get better and return to PLOF    Extremity Assessment (includes Sensation/Coordination)  Upper Extremity Assessment: Generalized weakness  Lower Extremity Assessment: Defer to PT evaluation    ADLs  Overall ADL's : Needs assistance/impaired Eating/Feeding: Modified independent Grooming: Wash/dry hands, Wash/dry face, Minimal assistance, Standing Grooming Details (indicate cue type and reason): MIN A for steadying during grooming tasks requiring b/l UE. MIN GUARD with single UE supported Upper Body Bathing: Modified independent Lower Body Bathing Details (indicate cue type and reason): Pt can demonstrate from seated position but would be unsafe currently to shower in standing to manage LE bathing Upper Body Dressing : Modified independent Lower Body Dressing: Min guard, Sitting/lateral  leans Lower Body Dressing Details (indicate cue type and reason): To grab shoes from floor and don/doff socks & shoes while seated EOB Toilet Transfer: Min guard, Rolling walker (2 wheels) Toileting- Clothing Manipulation and Hygiene: Min guard, Minimal assistance Functional mobility during ADLs: Min guard, Rolling walker (2 wheels)    Mobility  Overal bed mobility: Modified Independent General bed mobility comments: heavy use of bed rails and head of bed was slightly elevated    Transfers  Overall transfer level: Needs assistance Equipment used: Rolling walker (2 wheels) Transfers: Sit to/from Stand Sit to Stand: Min guard General transfer comment: CGA for safety    Ambulation / Gait / Stairs / Emergency planning/management officer  Ambulation/Gait Ambulation/Gait assistance: Min guard, Herbalist (Feet): 50 Feet Assistive device: 1 person hand held assist Gait Pattern/deviations: Staggering left, Staggering right, Drifts right/left General Gait Details: Pt was able to ambulate 50 ft without AD. did require +1 HHA at times to prevent LOB/unsteadiness Gait velocity: decreased    Posture / Balance Dynamic Sitting Balance Sitting balance - Comments: Good sitting balance reaching outside BOS to grab shoes from floor Balance Overall balance assessment: Needs assistance Sitting-balance support: No upper extremity supported, Feet supported Sitting balance-Leahy Scale: Normal Sitting balance - Comments: Good sitting balance reaching outside BOS to grab shoes from floor Standing balance support: Bilateral upper extremity supported, Reliant on assistive device for balance, During functional activity Standing balance-Leahy Scale: Fair Standing balance comment: Requires MIN GUARD for functional mobility with RW. Requires MIN A for tasks requiring dynamic balance with no UE supported    Special needs/care consideration Skin abrasions and ecchymosis on BLEs   Previous Home Environment (from  acute therapy documentation) Living Arrangements: Alone  Lives With: Alone Available Help at Discharge: Family Type of Home: Apartment Home Layout: Two level, Bed/bath upstairs Alternate Level Stairs-Rails: Right Alternate Level Stairs-Number of Steps: 14 Bathroom Shower/Tub: Tub/shower unit, Architectural technologist: Standard  Discharge Living Setting Plans for Discharge Living Setting: House Type of Home at Discharge: House Discharge Home Layout: Able to live on main level with bedroom/bathroom, One level Discharge Home Access: Stairs to enter Entrance Stairs-Rails: Right, Left, Can reach both Entrance Stairs-Number of Steps: 3-4 Discharge Bathroom Shower/Tub: Walk-in shower Discharge Bathroom Toilet: Standard Discharge Bathroom Accessibility: Yes How Accessible: Accessible via walker  Social/Family/Support Systems Patient Roles: Other (Comment) Contact Information: [phone] Anticipated Caregiver: Mitzi Hansen (brother) Ability/Limitations of Caregiver: min A Caregiver Availability: 24/7 Discharge Plan Discussed with Primary Caregiver: Yes Is Caregiver In Agreement with Plan?: Yes Does Caregiver/Family have Issues with Lodging/Transportation while Pt is in Rehab?: No  Goals Patient/Family Goal for Rehab: PT/OT Mod I Expected length of stay: 5-7 days Pt/Family Agrees to Admission and willing to participate: Yes Program Orientation Provided & Reviewed with Pt/Caregiver Including Roles  & Responsibilities: Yes  Decrease burden of Care through IP rehab admission: Specialzed equipment needs, Decrease number of caregivers, and Patient/family education  Possible need for SNF placement upon discharge: not anticipated   Patient Condition: I have reviewed medical records from [facility] spoken with CM, and patient and family member. I met with patient at the bedside for inpatient rehabilitation assessment.  Patient will benefit from ongoing PT and OT, can actively  participate in 3 hours of therapy a day 5 days of the week, and can make measurable gains during the admission.  Patient will also benefit from the coordinated team approach during an Inpatient Acute Rehabilitation admission.  The patient will receive intensive therapy as well as Rehabilitation physician, nursing, social worker, and care management interventions.  Due to safety, disease management, medication administration, pain management, and patient education the patient requires 24 hour a day rehabilitation nursing.  The patient is currently min A-min Guard  with mobility and basic ADLs.  Discharge setting and therapy post discharge at home with home health is anticipated.  Patient has agreed to participate in the Acute Inpatient Rehabilitation Program and will admit today.  Preadmission Screen Completed By:  Retta Diones, 06/01/2021 10:18 AM ______________________________________________________________________   Discussed status with Dr. Ranell Patrick on 06/01/21 at 10:02 and received approval for admission today.  Admission Coordinator:  Retta Diones, RN, time 10:03/Date 06/01/21   Assessment/Plan: Diagnosis: Spinal stenosis and severe neuropathy Does the need for close, 24 hr/day Medical supervision in concert with the patient's rehab needs make it unreasonable for this patient to be served in a less intensive setting? Yes Co-Morbidities requiring supervision/potential complications: HTN, bipolar disorder, nausea, vomiting, unsteady gait, lower extremity weakness Due to bladder management, bowel management, safety, skin/wound care, disease management, medication administration, pain management, and patient education, does the patient require 24 hr/day rehab nursing? Yes Does the patient require coordinated care of a physician, rehab nurse, PT, OT to address physical and functional deficits in the context of the above medical diagnosis(es)? Yes Addressing deficits in the following areas:  balance, endurance, locomotion, strength, transferring, bowel/bladder control, bathing, dressing, feeding, grooming, toileting, and psychosocial support Can the patient actively participate in an intensive therapy program of at least 3 hrs of therapy 5 days a week? Yes The potential for patient to make measurable gains while on inpatient rehab is excellent Anticipated functional outcomes upon discharge from inpatient rehab: modified independent PT, modified independent OT, I SLP Estimated rehab length of stay to reach the above functional goals is: 10-14 days Anticipated discharge destination: Home 10. Overall Rehab/Functional Prognosis: excellent   MD Signature: Leeroy Cha, MD

## 2021-05-30 DIAGNOSIS — Y92009 Unspecified place in unspecified non-institutional (private) residence as the place of occurrence of the external cause: Secondary | ICD-10-CM

## 2021-05-30 DIAGNOSIS — W19XXXA Unspecified fall, initial encounter: Secondary | ICD-10-CM

## 2021-05-30 LAB — GLUCOSE, CAPILLARY
Glucose-Capillary: 219 mg/dL — ABNORMAL HIGH (ref 70–99)
Glucose-Capillary: 229 mg/dL — ABNORMAL HIGH (ref 70–99)
Glucose-Capillary: 143 mg/dL — ABNORMAL HIGH (ref 70–99)

## 2021-05-30 NOTE — Plan of Care (Signed)
  Problem: Education: Goal: Knowledge of General Education information will improve Description Including pain rating scale, medication(s)/side effects and non-pharmacologic comfort measures Outcome: Progressing   Problem: Health Behavior/Discharge Planning: Goal: Ability to manage health-related needs will improve Outcome: Progressing   

## 2021-05-30 NOTE — Progress Notes (Signed)
Progress Note    Case [name]  YPP:[id number] DOB: 08/29/1975  DOA: 05/26/2021 PCP: Marisue Ivan, MD      Brief Narrative:    Medical records reviewed and are as summarized below:  Jerry Wheeler is a 45 y.o. male with medical history significant for type II DM, peripheral neuropathy, hypertension, bipolar disorder, who presented to the hospital because of progressive generalized weakness and inability to ambulate.  He fell the day prior to admission and was unable to stand up.      Assessment/Plan:   Principal Problem:   Lower extremity weakness Active Problems:   Bipolar disorder (HCC)   DM type 2 with diabetic peripheral neuropathy (HCC)   Recurrent major depressive disorder, in partial remission (HCC)   Obesity (BMI 35.0-39.9 without comorbidity)   Unsteady gait   Fall at home, initial encounter    Body mass index is 38.87 kg/m.  (Morbid obesity)   Unsteady gait/lower extremity weakness, s/p fall at home: Suspected to be from peripheral neuropathy.  PT and OT recommended discharge to inpatient rehab.  Awaiting placement. Lumbar puncture yielded a bloody tap but no need for repeat LP per neurologist.  Chronic lumbar stenosis from L2-L4: He has been evaluated by the neurosurgeon.  No plan for surgical intervention.  Outpatient follow-up with neurosurgeon for further management.  Occasional episodes of nausea and vomiting: Improved.  Antiemetics as needed.  Recommended outpatient evaluation for gastroparesis.  Type II DM with hyperglycemia and peripheral neuropathy: Continue concentrated regular insulin and monitor glucose levels.  He recently saw his endocrinologist in the office.  Continue outpatient follow-up with endocrinologist.  Elevated vitamin B12 level: Red and B12 supplements have been discontinued.  Outpatient follow-up with PCP  Bipolar disorder: Continue psychotropics  Hypertension: Continue amlodipine   Diet Order              Diet heart healthy/carb modified Room service appropriate? Yes; Fluid consistency: Thin  Diet effective now                      Consultants: Neurologist Neurosurgeon  Procedures: Lumbar puncture with bloody tap    Medications:    amLODipine  5 mg Oral Daily   atorvastatin  40 mg Oral Daily   cariprazine  3 mg Oral Daily   fenofibrate  160 mg Oral Daily   insulin regular human CONCENTRATED  85 Units Subcutaneous TID WC   lamoTRIgine  150 mg Oral BID   lithium carbonate  300 mg Oral BID   sodium chloride flush  3 mL Intravenous Q12H   Continuous Infusions:  sodium chloride       Anti-infectives (From admission, onward)    None              Family Communication/Anticipated D/C date and plan/Code Status   DVT prophylaxis: SCDs Start: 05/26/21 1110     Code Status: Full Code  Family Communication: None Disposition Plan: Possible discharge to inpatient rehab  Status is: Inpatient  Remains inpatient appropriate because: Unsafe discharge plan           Subjective:   C/o unsteady gait.  No nausea, vomiting or headache today  Objective:    Vitals:   05/30/21 0050 05/30/21 0432 05/30/21 0736 05/30/21 1130  BP: 138/77 127/89 (!) 149/86 115/65  Pulse: 84 80 79 79  Resp: 18 18 19 19   Temp: 98 F (36.7 C) 97.6 F (36.4 C) 97.9 F (36.6 C) 98.1 F (36.7 C)  TempSrc:   Oral   SpO2: 97% 97% 97% 95%  Weight:      Height:       No data found.    Intake/Output Summary (Last 24 hours) at 05/30/2021 1309 Last data filed at 05/30/2021 1000 Gross per 24 hour  Intake 480 ml  Output --  Net 480 ml   Filed Weights   05/28/21 0427  Weight: (!) 156.5 kg    Exam:  GEN: NAD SKIN: No rash EYES: EOMI ENT: MMM CV: RRR PULM: CTA B ABD: soft, obese, NT, +BS CNS: AAO x 3, non focal EXT: No edema or tenderness         Data Reviewed:   I have personally reviewed following labs and imaging studies:  Labs: Labs show the  following:   Basic Metabolic Panel: Recent Labs  Lab 05/26/21 0033 05/27/21 0646 05/29/21 1124  NA 138 137 138  K 3.5 3.7 4.1  CL 108 106 105  CO2 23 25 26   GLUCOSE 170* 227* 200*  BUN 17 11 14   CREATININE 1.04 0.84 0.91  CALCIUM 10.2 9.7 10.6*   GFR Estimated Creatinine Clearance: 168.6 mL/min (by C-G formula based on SCr of 0.91 mg/dL). Liver Function Tests: No results for input(s): AST, ALT, ALKPHOS, BILITOT, PROT, ALBUMIN in the last 168 hours. No results for input(s): LIPASE, AMYLASE in the last 168 hours. No results for input(s): AMMONIA in the last 168 hours. Coagulation profile No results for input(s): INR, PROTIME in the last 168 hours.  CBC: Recent Labs  Lab 05/26/21 0033 05/27/21 0646  WBC 9.6 6.4  HGB 13.6 13.3  HCT 42.2 42.1  MCV 85.8 85.4  PLT 328 266   Cardiac Enzymes: No results for input(s): CKTOTAL, CKMB, CKMBINDEX, TROPONINI in the last 168 hours. BNP (last 3 results) No results for input(s): PROBNP in the last 8760 hours. CBG: Recent Labs  Lab 05/29/21 0753 05/29/21 1138 05/29/21 1727 05/29/21 2033 05/30/21 1226  GLUCAP 86 197* 201* 129* 219*   D-Dimer: No results for input(s): DDIMER in the last 72 hours. Hgb A1c: No results for input(s): HGBA1C in the last 72 hours. Lipid Profile: No results for input(s): CHOL, HDL, LDLCALC, TRIG, CHOLHDL, LDLDIRECT in the last 72 hours. Thyroid function studies: No results for input(s): TSH, T4TOTAL, T3FREE, THYROIDAB in the last 72 hours.  Invalid input(s): FREET3 Anemia work up: No results for input(s): VITAMINB12, FOLATE, FERRITIN, TIBC, IRON, RETICCTPCT in the last 72 hours.  Sepsis Labs: Recent Labs  Lab 05/26/21 0033 05/27/21 0646  WBC 9.6 6.4    Microbiology Recent Results (from the past 240 hour(s))  Resp Panel by RT-PCR (Flu A&B, Covid) Nasopharyngeal Swab     Status: None   Collection Time: 05/26/21  4:29 AM   Specimen: Nasopharyngeal Swab; Nasopharyngeal(NP) swabs in vial  transport medium  Result Value Ref Range Status   SARS Coronavirus 2 by RT PCR NEGATIVE NEGATIVE Final    Comment: (NOTE) SARS-CoV-2 target nucleic acids are NOT DETECTED.  The SARS-CoV-2 RNA is generally detectable in upper respiratory specimens during the acute phase of infection. The lowest concentration of SARS-CoV-2 viral copies this assay can detect is 138 copies/mL. A negative result does not preclude SARS-Cov-2 infection and should not be used as the sole basis for treatment or other patient management decisions. A negative result may occur with  improper specimen collection/handling, submission of specimen other than nasopharyngeal swab, presence of viral mutation(s) within the areas targeted by this assay, and inadequate number  of viral copies(<138 copies/mL). A negative result must be combined with clinical observations, patient history, and epidemiological information. The expected result is Negative.  Fact Sheet for Patients:  BloggerCourse.com  Fact Sheet for Healthcare Providers:  SeriousBroker.it  This test is no t yet approved or cleared by the [place] FDA and  has been authorized for detection and/or diagnosis of SARS-CoV-2 by FDA under an Emergency Use Authorization (EUA). This EUA will remain  in effect (meaning this test can be used) for the duration of the COVID-19 declaration under Section 564(b)(1) of the Act, 21 U.S.C.section 360bbb-3(b)(1), unless the authorization is terminated  or revoked sooner.       Influenza A by PCR NEGATIVE NEGATIVE Final   Influenza B by PCR NEGATIVE NEGATIVE Final    Comment: (NOTE) The Xpert Xpress SARS-CoV-2/FLU/RSV plus assay is intended as an aid in the diagnosis of influenza from Nasopharyngeal swab specimens and should not be used as a sole basis for treatment. Nasal washings and aspirates are unacceptable for Xpert Xpress SARS-CoV-2/FLU/RSV testing.  Fact  Sheet for Patients: BloggerCourse.com  Fact Sheet for Healthcare Providers: SeriousBroker.it  This test is not yet approved or cleared by the [place] FDA and has been authorized for detection and/or diagnosis of SARS-CoV-2 by FDA under an Emergency Use Authorization (EUA). This EUA will remain in effect (meaning this test can be used) for the duration of the COVID-19 declaration under Section 564(b)(1) of the Act, 21 U.S.C. section 360bbb-3(b)(1), unless the authorization is terminated or revoked.  Performed at [facility], [address] [place], [place] [address]     Procedures and diagnostic studies:  No results found.             LOS: 4 days   Malva Diesing  [facility]   Pager on [email or web address]. If 7PM-7AM, please contact night-coverage at [email or web address]     05/30/2021, 1:09 PM

## 2021-05-30 NOTE — Progress Notes (Signed)
Inpatient Rehab Admissions Coordinator:   I do not have a CIR bed for this Pt. Today. I will continue to follow for potential admit pending bed availability.  Megan Salon, MS, CCC-SLP Rehab Admissions Coordinator  [phone] (celll) [phone] (office)

## 2021-05-31 DIAGNOSIS — I10 Essential (primary) hypertension: Secondary | ICD-10-CM

## 2021-05-31 LAB — GLUCOSE, CAPILLARY
Glucose-Capillary: 89 mg/dL (ref 70–99)
Glucose-Capillary: 154 mg/dL — ABNORMAL HIGH (ref 70–99)
Glucose-Capillary: 87 mg/dL (ref 70–99)
Glucose-Capillary: 72 mg/dL (ref 70–99)

## 2021-05-31 NOTE — Progress Notes (Signed)
Occupational Therapy Treatment Patient Details Name: Jerry Wheeler MRN: [id number] DOB: [birth year] Today's Date: 05/31/2021   History of present illness Jerry Wheeler is a 45 y.o. male with medical history significant for morbid obesity, diabetes mellitus, bipolar disorder and hypertension who presents to the emergency room for evaluation of weakness and inability to ambulate.  Patient states that he has peripheral neuropathy related to his underlying diabetes mellitus last 3 weeks he has had progressive lower extremity and 1 day prior to his admission he fell and was able to stand up.  He states that his balance is off and he is able to ambulate without holding onto something.  He denies having any back pain and denies having any urinary or fecal incontinence.  He denies having any fever, no chills, no cough or congestion.  He denies having any recent viral infection.   OT comments  Pt seen for OT treatment on this date. Upon arrival to room, pt awake and seated upright in bed. Pt A&O x 4 and reporting no pain. Pt reported that he has been independently carrying out HEP provided at last session and was very motivated to participate in OT tx this date. Pt performed functional mobility of short household distances requiring only MIN GUARD when using RW, however required MIN A for steadying when engaging in standing ADLs that require use of b/l UE. With UE unsupported, pt engaged washing countertop, folding washcloths, and placing washcloths in cabinets while standing to promote improved balance for IADL tasks (e.g., putting dishes/laundry away). Pt is making good progress toward goals and continues to benefit from skilled OT services to maximize return to PLOF and minimize risk of future falls, injury, caregiver burden, and readmission. Will continue to follow POC. Discharge recommendation remains appropriate.     Recommendations for follow up therapy are one component of a multi-disciplinary  discharge planning process, led by the attending physician.  Recommendations may be updated based on patient status, additional functional criteria and insurance authorization.    Follow Up Recommendations  Acute inpatient rehab (3hours/day)    Assistance Recommended at Discharge Frequent or constant Supervision/Assistance  Equipment Recommendations  BSC/3in1;Tub/shower bench       Precautions / Restrictions Precautions Precautions: Fall Restrictions Weight Bearing Restrictions: No       Mobility Bed Mobility Overal bed mobility: Modified Independent                  Transfers Overall transfer level: Needs assistance Equipment used: Rolling walker (2 wheels) Transfers: Sit to/from Stand Sit to Stand: Min guard                 Balance Overall balance assessment: Needs assistance Sitting-balance support: No upper extremity supported;Feet supported Sitting balance-Leahy Scale: Normal Sitting balance - Comments: Good sitting balance reaching outside BOS to grab shoes from floor   Standing balance support: Bilateral upper extremity supported;Reliant on assistive device for balance;During functional activity Standing balance-Leahy Scale: Fair Standing balance comment: Requires MIN GUARD for functional mobility with RW. Requires MIN A for tasks requiring dynamic balance with no UE supported                           ADL either performed or assessed with clinical judgement   ADL Overall ADL's : Needs assistance/impaired     Grooming: Wash/dry hands;Wash/dry face;Minimal assistance;Standing Grooming Details (indicate cue type and reason): MIN A for steadying during grooming tasks requiring b/l UE. MIN GUARD with  single UE supported             Lower Body Dressing: Min guard;Sitting/lateral leans Lower Body Dressing Details (indicate cue type and reason): To grab shoes from floor and don/doff socks & shoes while seated EOB              Functional mobility during ADLs: Min guard;Rolling walker (2 wheels)        Cognition Arousal/Alertness: Awake/alert Behavior During Therapy: WFL for tasks assessed/performed Overall Cognitive Status: Within Functional Limits for tasks assessed                                            Exercises Other Exercises Other Exercises: With UE unsupported, pt engaged washing countertop, folding washcloths, and placing washcloths in cabinets while standing to promote improved balance for IADL tasks (e.g., putting dishes/laundry away)           Pertinent Vitals/ Pain        No pain         Frequency  Min 3X/week        Progress Toward Goals  OT Goals(current goals can now be found in the care plan section)  Progress towards OT goals: Progressing toward goals  Acute Rehab OT Goals Patient Stated Goal: Pt would like to return home and be independent OT Goal Formulation: With patient Time For Goal Achievement: 06/10/21 Potential to Achieve Goals: Good  Plan Discharge plan remains appropriate;Frequency remains appropriate       AM-PAC OT "6 Clicks" Daily Activity     Outcome Measure   Help from another person eating meals?: None Help from another person taking care of personal grooming?: A Little Help from another person toileting, which includes using toliet, bedpan, or urinal?: A Little Help from another person bathing (including washing, rinsing, drying)?: A Little Help from another person to put on and taking off regular upper body clothing?: None Help from another person to put on and taking off regular lower body clothing?: A Little 6 Click Score: 20    End of Session Equipment Utilized During Treatment: Rolling walker (2 wheels);Gait belt  OT Visit Diagnosis: Unsteadiness on feet (R26.81);History of falling (Z91.81);Muscle weakness (generalized) (M62.81)   Activity Tolerance Patient tolerated treatment well   Patient Left in chair;with call  bell/phone within reach   Nurse Communication Mobility status        Time: 3354-5625 OT Time Calculation (min): 25 min  Charges: OT General Charges $OT Visit: 1 Visit OT Treatments $Self Care/Home Management : 23-37 mins  Matthew Folks, OTR/L [facility] [phone]

## 2021-05-31 NOTE — TOC Progression Note (Signed)
Transition of Care [facility]) - Progression Note    Patient Details  Name: Jerry Wheeler MRN: [id number] Date of Birth: 11/22/1975  Transition [facility] CM/SW Contact  Hetty Ely, RN Phone Number: 05/31/2021, 1:53 PM  Clinical Narrative: Reached out to CIR to inquire about bed for transfer, was informed there was no bed today. Attending notified.           Expected Discharge Plan and Services                                                 Social Determinants of Health (SDOH) Interventions    Readmission Risk Interventions No flowsheet data found.

## 2021-05-31 NOTE — Progress Notes (Signed)
Physical Therapy Treatment Patient Details Name: Jerry Wheeler MRN: [id number] DOB: 07-26-1975 Today's Date: 05/31/2021   History of Present Illness Jerry Wheeler is a 45 y.o. male with medical history significant for morbid obesity, diabetes mellitus, bipolar disorder and hypertension who presents to the emergency room for evaluation of weakness and inability to ambulate.  Patient states that he has peripheral neuropathy related to his underlying diabetes mellitus last 3 weeks he has had progressive lower extremity and 1 day prior to his admission he fell and was able to stand up.  He states that his balance is off and he is able to ambulate without holding onto something.  He denies having any back pain and denies having any urinary or fecal incontinence.  He denies having any fever, no chills, no cough or congestion.  He denies having any recent viral infection.    PT Comments    Pt was long sitting in bed upon arriving. Supportive father at bedside. He agrees to session and continues to be cooperative and motivated throughout. CIR did not have bed today however pt seems optimistic about possible admission tomorrow. He was able to exit L side of bed with increased time. Required mostly CGA throughout session however did ambulate without assistive device ~ 50 ft. After ~ 8 ft pt requested to use HHA +1 due to unsteadiness. Pt is a high fall risk. Will benefit from CIR to address deficits while maximizing safety and independence with ADLs. Acute PT will continue to follow and progress per current POC.    Recommendations for follow up therapy are one component of a multi-disciplinary discharge planning process, led by the attending physician.  Recommendations may be updated based on patient status, additional functional criteria and insurance authorization.  Follow Up Recommendations  Acute inpatient rehab (3hours/day)     Assistance Recommended at Discharge Intermittent  Supervision/Assistance  Equipment Recommendations  Other (comment) (defer to next level of care)       Precautions / Restrictions Precautions Precautions: Fall Restrictions Weight Bearing Restrictions: No     Mobility  Bed Mobility Overal bed mobility: Modified Independent     Transfers Overall transfer level: Needs assistance Equipment used: Rolling walker (2 wheels) Transfers: Sit to/from Stand Sit to Stand: Min guard      General transfer comment: CGA for safety    Ambulation/Gait Ambulation/Gait assistance: Min guard;Min assist Gait Distance (Feet): 50 Feet Assistive device: 1 person hand held assist Gait Pattern/deviations: Staggering left;Staggering right;Drifts right/left Gait velocity: decreased     General Gait Details: Pt was able to ambulate 50 ft without AD. did require +1 HHA at times to prevent LOB/unsteadiness     Balance Overall balance assessment: Needs assistance Sitting-balance support: No upper extremity supported;Feet supported Sitting balance-Leahy Scale: Normal Sitting balance - Comments: Good sitting balance reaching outside BOS to grab shoes from floor   Standing balance support: Bilateral upper extremity supported;Reliant on assistive device for balance;During functional activity Standing balance-Leahy Scale: Fair Standing balance comment: Requires MIN GUARD for functional mobility with RW. Requires MIN A for tasks requiring dynamic balance with no UE supported      Cognition Arousal/Alertness: Awake/alert Behavior During Therapy: WFL for tasks assessed/performed Overall Cognitive Status: Within Functional Limits for tasks assessed      General Comments: Pt is A and O x 4.Cooperative and pleasant. Motivated to get better and return to [facility]        Exercises Other Exercises Other Exercises: With UE unsupported, pt engaged washing countertop, folding washcloths,  and placing washcloths in cabinets while standing to promote improved  balance for IADL tasks (e.g., putting dishes/laundry away)    General Comments General comments (skin integrity, edema, etc.): author issued black theraband to promote UE strengthening. recommended also performing lower body strengthening exercises. Does continue to present with coordination deficits.      Pertinent Vitals/Pain Pain Assessment: No/denies pain     PT Goals (current goals can now be found in the care plan section) Acute Rehab PT Goals Patient Stated Goal: " go to rehab then return home." Progress towards PT goals: Progressing toward goals    Frequency    Min 3X/week      PT Plan Current plan remains appropriate       AM-PAC PT "6 Clicks" Mobility   Outcome Measure  Help needed turning from your back to your side while in a flat bed without using bedrails?: A Little Help needed moving from lying on your back to sitting on the side of a flat bed without using bedrails?: A Little Help needed moving to and from a bed to a chair (including a wheelchair)?: A Little Help needed standing up from a chair using your arms (e.g., wheelchair or bedside chair)?: A Little Help needed to walk in hospital room?: A Little Help needed climbing 3-5 steps with a railing? : A Lot 6 Click Score: 17    End of Session Equipment Utilized During Treatment: Gait belt Activity Tolerance: Patient tolerated treatment well Patient left: in bed;with call bell/phone within reach Nurse Communication: Mobility status PT Visit Diagnosis: Unsteadiness on feet (R26.81);Other abnormalities of gait and mobility (R26.89);Muscle weakness (generalized) (M62.81);Difficulty in walking, not elsewhere classified (R26.2);Other symptoms and signs involving the nervous system (R29.898)     Time: 9622-2979 PT Time Calculation (min) (ACUTE ONLY): 25 min  Charges:  $Gait Training: 8-22 mins $Therapeutic Activity: 8-22 mins                     Jetta Lout PTA 05/31/21, 4:59 PM

## 2021-05-31 NOTE — Progress Notes (Signed)
Inpatient Rehab Admissions Coordinator:   I do not have a CIR bed for this Pt.today. I will continue to follow for potential admission pending bed availability.  Megan Salon, MS, CCC-SLP Rehab Admissions Coordinator  [phone] (celll) [phone] (office)

## 2021-05-31 NOTE — Progress Notes (Addendum)
PROGRESS NOTE    Param Capri  ZTI:[id number] DOB: [birth year] DOA: 05/26/2021 PCP: Marisue Ivan, MD    Assessment & Plan:   Principal Problem:   Lower extremity weakness Active Problems:   Bipolar disorder (HCC)   DM type 2 with diabetic peripheral neuropathy (HCC)   Recurrent major depressive disorder, in partial remission (HCC)   Obesity (BMI 35.0-39.9 without comorbidity)   Unsteady gait   Fall at home, initial encounter   Unsteady gait/lower extremity weakness: s/p fall at home. Possibly secondary to peripheral neuropathy. LP yielded a bloody tap but no need for repeat LP per neurologist. PT/OT recs CIR. Waiting on bed   Chronic lumbar stenosis: L2-L4.  No plan for surgical intervention.  Outpatient follow-up with neurosurgeon for further management.   Occasional episodes of nausea and vomiting: Improved. Zofran prn. Recommended outpatient evaluation for gastroparesis.   DM2: likely poorly controlled. Continue on SSI w/ accuchecks    Bipolar disorder: continue on home dose of lamictal, vraylar & lithium   HTN: continue on amlodipine   DVT prophylaxis: SCDs Code Status: full  Family Communication:  Disposition Plan: d/c to CIR, waiting on a bed   Level of care: Med-Surg  Status is: Inpatient  Remains inpatient appropriate because: medically stable for d/c, waiting on CIR bed      Consultants:  Neuro surg   Procedures:   Antimicrobials:    Subjective: Pt c/o malaise   Objective: Vitals:   05/30/21 1524 05/30/21 2113 05/31/21 0033 05/31/21 0404  BP: (!) 143/84 (!) 151/86 138/82 136/78  Pulse: 80 92 89 86  Resp: 19 16 20 20   Temp: 98 F (36.7 C) 97.8 F (36.6 C) 98.3 F (36.8 C) 97.8 F (36.6 C)  TempSrc: Oral Oral Oral Oral  SpO2: 100% 99% 97% 96%  Weight:      Height:        Intake/Output Summary (Last 24 hours) at 05/31/2021 0722 Last data filed at 05/30/2021 1340 Gross per 24 hour  Intake 720 ml  Output --  Net 720 ml    Filed Weights   05/28/21 0427  Weight: (!) 156.5 kg    Examination:  General exam: Appears calm and comfortable  Respiratory system: Clear to auscultation. Respiratory effort normal. Cardiovascular system: S1 & S2+. No rubs, gallops or clicks.  Gastrointestinal system: Abdomen is nondistended, soft and nontender. Normal bowel sounds heard. Central nervous system: Alert and oriented. Moves all extremities Psychiatry: Judgement and insight appear normal. Flat mood and affect     Data Reviewed: I have personally reviewed following labs and imaging studies  CBC: Recent Labs  Lab 05/26/21 0033 05/27/21 0646  WBC 9.6 6.4  HGB 13.6 13.3  HCT 42.2 42.1  MCV 85.8 85.4  PLT 328 266   Basic Metabolic Panel: Recent Labs  Lab 05/26/21 0033 05/27/21 0646 05/29/21 1124  NA 138 137 138  K 3.5 3.7 4.1  CL 108 106 105  CO2 23 25 26   GLUCOSE 170* 227* 200*  BUN 17 11 14   CREATININE 1.04 0.84 0.91  CALCIUM 10.2 9.7 10.6*   GFR: Estimated Creatinine Clearance: 168.6 mL/min (by C-G formula based on SCr of 0.91 mg/dL). Liver Function Tests: No results for input(s): AST, ALT, ALKPHOS, BILITOT, PROT, ALBUMIN in the last 168 hours. No results for input(s): LIPASE, AMYLASE in the last 168 hours. No results for input(s): AMMONIA in the last 168 hours. Coagulation Profile: No results for input(s): INR, PROTIME in the last 168 hours. Cardiac Enzymes: No  results for input(s): CKTOTAL, CKMB, CKMBINDEX, TROPONINI in the last 168 hours. BNP (last 3 results) No results for input(s): PROBNP in the last 8760 hours. HbA1C: No results for input(s): HGBA1C in the last 72 hours. CBG: Recent Labs  Lab 05/29/21 1727 05/29/21 2033 05/30/21 1226 05/30/21 1636 05/30/21 2112  GLUCAP 201* 129* 219* 229* 143*   Lipid Profile: No results for input(s): CHOL, HDL, LDLCALC, TRIG, CHOLHDL, LDLDIRECT in the last 72 hours. Thyroid Function Tests: No results for input(s): TSH, T4TOTAL, FREET4,  T3FREE, THYROIDAB in the last 72 hours. Anemia Panel: No results for input(s): VITAMINB12, FOLATE, FERRITIN, TIBC, IRON, RETICCTPCT in the last 72 hours. Sepsis Labs: No results for input(s): PROCALCITON, LATICACIDVEN in the last 168 hours.  Recent Results (from the past 240 hour(s))  Resp Panel by RT-PCR (Flu A&B, Covid) Nasopharyngeal Swab     Status: None   Collection Time: 05/26/21  4:29 AM   Specimen: Nasopharyngeal Swab; Nasopharyngeal(NP) swabs in vial transport medium  Result Value Ref Range Status   SARS Coronavirus 2 by RT PCR NEGATIVE NEGATIVE Final    Comment: (NOTE) SARS-CoV-2 target nucleic acids are NOT DETECTED.  The SARS-CoV-2 RNA is generally detectable in upper respiratory specimens during the acute phase of infection. The lowest concentration of SARS-CoV-2 viral copies this assay can detect is 138 copies/mL. A negative result does not preclude SARS-Cov-2 infection and should not be used as the sole basis for treatment or other patient management decisions. A negative result may occur with  improper specimen collection/handling, submission of specimen other than nasopharyngeal swab, presence of viral mutation(s) within the areas targeted by this assay, and inadequate number of viral copies(<138 copies/mL). A negative result must be combined with clinical observations, patient history, and epidemiological information. The expected result is Negative.  Fact Sheet for Patients:  BloggerCourse.com  Fact Sheet for Healthcare Providers:  SeriousBroker.it  This test is no t yet approved or cleared by the [place] FDA and  has been authorized for detection and/or diagnosis of SARS-CoV-2 by FDA under an Emergency Use Authorization (EUA). This EUA will remain  in effect (meaning this test can be used) for the duration of the COVID-19 declaration under Section 564(b)(1) of the Act, 21 U.S.C.section 360bbb-3(b)(1),  unless the authorization is terminated  or revoked sooner.       Influenza A by PCR NEGATIVE NEGATIVE Final   Influenza B by PCR NEGATIVE NEGATIVE Final    Comment: (NOTE) The Xpert Xpress SARS-CoV-2/FLU/RSV plus assay is intended as an aid in the diagnosis of influenza from Nasopharyngeal swab specimens and should not be used as a sole basis for treatment. Nasal washings and aspirates are unacceptable for Xpert Xpress SARS-CoV-2/FLU/RSV testing.  Fact Sheet for Patients: BloggerCourse.com  Fact Sheet for Healthcare Providers: SeriousBroker.it  This test is not yet approved or cleared by the [place] FDA and has been authorized for detection and/or diagnosis of SARS-CoV-2 by FDA under an Emergency Use Authorization (EUA). This EUA will remain in effect (meaning this test can be used) for the duration of the COVID-19 declaration under Section 564(b)(1) of the Act, 21 U.S.C. section 360bbb-3(b)(1), unless the authorization is terminated or revoked.  Performed at [facility], [address], [place], [place] [address]          Radiology Studies: No results found.      Scheduled Meds:  amLODipine  5 mg Oral Daily   atorvastatin  40 mg Oral Daily   cariprazine  3 mg  Oral Daily   fenofibrate  160 mg Oral Daily   insulin regular human CONCENTRATED  85 Units Subcutaneous TID WC   lamoTRIgine  150 mg Oral BID   lithium carbonate  300 mg Oral BID   sodium chloride flush  3 mL Intravenous Q12H   Continuous Infusions:  sodium chloride       LOS: 5 days    Time spent: 33 mins     Charise Killian, MD [facility] Pager [phone]  If 7PM-7AM, please contact night-coverage 05/31/2021, 7:22 AM

## 2021-06-01 ENCOUNTER — Encounter (HOSPITAL_COMMUNITY): Payer: Self-pay | Admitting: Physical Medicine and Rehabilitation

## 2021-06-01 ENCOUNTER — Other Ambulatory Visit: Payer: Self-pay

## 2021-06-01 ENCOUNTER — Encounter: Payer: Self-pay | Admitting: Internal Medicine

## 2021-06-01 ENCOUNTER — Inpatient Hospital Stay (HOSPITAL_COMMUNITY)
Admission: RE | Admit: 2021-06-01 | Discharge: 2021-06-09 | DRG: 561 | Disposition: A | Discharge status: Home / Self Care | Payer: Medicare Other | Source: Other Acute Inpatient Hospital | Attending: Physical Medicine and Rehabilitation | Admitting: Physical Medicine and Rehabilitation

## 2021-06-01 DIAGNOSIS — F5104 Psychophysiologic insomnia: Secondary | ICD-10-CM | POA: Diagnosis present

## 2021-06-01 DIAGNOSIS — R29898 Other symptoms and signs involving the musculoskeletal system: Secondary | ICD-10-CM | POA: Diagnosis not present

## 2021-06-01 DIAGNOSIS — F419 Anxiety disorder, unspecified: Secondary | ICD-10-CM | POA: Diagnosis present

## 2021-06-01 DIAGNOSIS — Z794 Long term (current) use of insulin: Secondary | ICD-10-CM

## 2021-06-01 DIAGNOSIS — E1143 Type 2 diabetes mellitus with diabetic autonomic (poly)neuropathy: Secondary | ICD-10-CM | POA: Diagnosis present

## 2021-06-01 DIAGNOSIS — I10 Essential (primary) hypertension: Secondary | ICD-10-CM | POA: Diagnosis present

## 2021-06-01 DIAGNOSIS — K5900 Constipation, unspecified: Secondary | ICD-10-CM

## 2021-06-01 DIAGNOSIS — Z79899 Other long term (current) drug therapy: Secondary | ICD-10-CM

## 2021-06-01 DIAGNOSIS — E1142 Type 2 diabetes mellitus with diabetic polyneuropathy: Secondary | ICD-10-CM | POA: Diagnosis present

## 2021-06-01 DIAGNOSIS — Z8249 Family history of ischemic heart disease and other diseases of the circulatory system: Secondary | ICD-10-CM | POA: Diagnosis not present

## 2021-06-01 DIAGNOSIS — Z7982 Long term (current) use of aspirin: Secondary | ICD-10-CM | POA: Diagnosis not present

## 2021-06-01 DIAGNOSIS — J439 Emphysema, unspecified: Secondary | ICD-10-CM | POA: Diagnosis present

## 2021-06-01 DIAGNOSIS — Z7984 Long term (current) use of oral hypoglycemic drugs: Secondary | ICD-10-CM | POA: Diagnosis not present

## 2021-06-01 DIAGNOSIS — Z87891 Personal history of nicotine dependence: Secondary | ICD-10-CM | POA: Diagnosis not present

## 2021-06-01 DIAGNOSIS — E1042 Type 1 diabetes mellitus with diabetic polyneuropathy: Secondary | ICD-10-CM | POA: Diagnosis not present

## 2021-06-01 DIAGNOSIS — Z6838 Body mass index (BMI) 38.0-38.9, adult: Secondary | ICD-10-CM

## 2021-06-01 DIAGNOSIS — G4733 Obstructive sleep apnea (adult) (pediatric): Secondary | ICD-10-CM | POA: Diagnosis present

## 2021-06-01 DIAGNOSIS — K3184 Gastroparesis: Secondary | ICD-10-CM | POA: Diagnosis present

## 2021-06-01 DIAGNOSIS — F319 Bipolar disorder, unspecified: Secondary | ICD-10-CM | POA: Diagnosis present

## 2021-06-01 DIAGNOSIS — Z4789 Encounter for other orthopedic aftercare: Secondary | ICD-10-CM | POA: Diagnosis present

## 2021-06-01 DIAGNOSIS — M5106 Intervertebral disc disorders with myelopathy, lumbar region: Secondary | ICD-10-CM

## 2021-06-01 DIAGNOSIS — E1065 Type 1 diabetes mellitus with hyperglycemia: Secondary | ICD-10-CM | POA: Diagnosis not present

## 2021-06-01 DIAGNOSIS — E669 Obesity, unspecified: Secondary | ICD-10-CM | POA: Diagnosis present

## 2021-06-01 LAB — GLUCOSE, CAPILLARY
Glucose-Capillary: 94 mg/dL (ref 70–99)
Glucose-Capillary: 156 mg/dL — ABNORMAL HIGH (ref 70–99)
Glucose-Capillary: 129 mg/dL — ABNORMAL HIGH (ref 70–99)
Glucose-Capillary: 103 mg/dL — ABNORMAL HIGH (ref 70–99)

## 2021-06-01 LAB — BASIC METABOLIC PANEL
Anion gap: 7 (ref 5–15)
BUN: 15 mg/dL (ref 6–20)
CO2: 24 mmol/L (ref 22–32)
Calcium: 9.8 mg/dL (ref 8.9–10.3)
Chloride: 107 mmol/L (ref 98–111)
Creatinine, Ser: 0.93 mg/dL (ref 0.61–1.24)
GFR, Estimated: >60 mL/min (ref >60)
Glucose, Bld: 81 mg/dL (ref 70–99)
Potassium: 3.8 mmol/L (ref 3.5–5.1)
Sodium: 138 mmol/L (ref 135–145)

## 2021-06-01 LAB — CBC
HCT: 39.6 % (ref 39.0–52.0)
Hemoglobin: 12.7 g/dL — ABNORMAL LOW (ref 13.0–17.0)
MCH: 27.1 pg (ref 26.0–34.0)
MCHC: 32.1 g/dL (ref 30.0–36.0)
MCV: 84.4 fL (ref 80.0–100.0)
Platelets: 268 10*3/uL (ref 150–400)
RBC: 4.69 MIL/uL (ref 4.22–5.81)
RDW: 13.2 % (ref 11.5–15.5)
WBC: 8.6 10*3/uL (ref 4.0–10.5)
nRBC: 0 % (ref 0.0–0.2)

## 2021-06-01 LAB — HEMOGLOBIN A1C
Hgb A1c MFr Bld: 10.3 % — ABNORMAL HIGH (ref 4.8–5.6)
Mean Plasma Glucose: 248.91 mg/dL

## 2021-06-01 MED ORDER — PROCHLORPERAZINE MALEATE 5 MG PO TABS: 5.0000 mg | ORAL_TABLET | Freq: Four times a day (QID) | ORAL | Status: DC | PRN

## 2021-06-01 MED ORDER — LITHIUM CARBONATE ER 300 MG PO TBCR
300.0000 mg | EXTENDED_RELEASE_TABLET | Freq: Two times a day (BID) | ORAL | Status: DC
  Administered 2021-06-01 – 2021-06-09 (×16): 300 mg via ORAL
  Filled 2021-06-01 (×16): qty 1

## 2021-06-01 MED ORDER — LITHIUM CARBONATE ER 300 MG PO TBCR: 300.0000 mg | EXTENDED_RELEASE_TABLET | Freq: Two times a day (BID) | ORAL | Status: AC

## 2021-06-01 MED ORDER — PROCHLORPERAZINE 25 MG RE SUPP: 12.5000 mg | Freq: Four times a day (QID) | RECTAL | Status: DC | PRN

## 2021-06-01 MED ORDER — LAMOTRIGINE 100 MG PO TABS
150.0000 mg | ORAL_TABLET | Freq: Two times a day (BID) | ORAL | Status: DC
  Administered 2021-06-01 – 2021-06-09 (×16): 150 mg via ORAL
  Filled 2021-06-01 (×16): qty 2

## 2021-06-01 MED ORDER — METHOCARBAMOL 500 MG PO TABS: 500.0000 mg | ORAL_TABLET | Freq: Four times a day (QID) | ORAL | Status: DC | PRN

## 2021-06-01 MED ORDER — PROSOURCE PLUS PO LIQD
30.0000 mL | Freq: Two times a day (BID) | ORAL | Status: DC
  Administered 2021-06-01 – 2021-06-08 (×15): 30 mL via ORAL
  Filled 2021-06-01 (×15): qty 30

## 2021-06-01 MED ORDER — FLEET ENEMA 7-19 GM/118ML RE ENEM: 1.0000 | ENEMA | Freq: Once | RECTAL | Status: DC | PRN

## 2021-06-01 MED ORDER — VITAMIN B-12 1000 MCG PO TABS: 1000.0000 ug | ORAL_TABLET | Freq: Every day | ORAL | Status: DC

## 2021-06-01 MED ORDER — AMLODIPINE BESYLATE 5 MG PO TABS
5.0000 mg | ORAL_TABLET | Freq: Every day | ORAL | Status: DC
  Administered 2021-06-02 – 2021-06-09 (×8): 5 mg via ORAL
  Filled 2021-06-01 (×8): qty 1

## 2021-06-01 MED ORDER — ALUM & MAG HYDROXIDE-SIMETH 200-200-20 MG/5ML PO SUSP: 30.0000 mL | ORAL | Status: DC | PRN

## 2021-06-01 MED ORDER — ATORVASTATIN CALCIUM 40 MG PO TABS
40.0000 mg | ORAL_TABLET | Freq: Every day | ORAL | Status: DC
  Administered 2021-06-01 – 2021-06-08 (×8): 40 mg via ORAL
  Filled 2021-06-01 (×8): qty 1

## 2021-06-01 MED ORDER — FENOFIBRATE 160 MG PO TABS
160.0000 mg | ORAL_TABLET | Freq: Every day | ORAL | Status: DC
  Administered 2021-06-01 – 2021-06-08 (×8): 160 mg via ORAL
  Filled 2021-06-01 (×8): qty 1

## 2021-06-01 MED ORDER — PROCHLORPERAZINE EDISYLATE 10 MG/2ML IJ SOLN: 5.0000 mg | Freq: Four times a day (QID) | INTRAMUSCULAR | Status: DC | PRN

## 2021-06-01 MED ORDER — TRAZODONE HCL 50 MG PO TABS: 25.0000 mg | ORAL_TABLET | Freq: Every evening | ORAL | Status: DC | PRN

## 2021-06-01 MED ORDER — POLYETHYLENE GLYCOL 3350 17 G PO PACK: 17.0000 g | PACK | Freq: Every day | ORAL | Status: DC | PRN

## 2021-06-01 MED ORDER — ACETAMINOPHEN 325 MG PO TABS
325.0000 mg | ORAL_TABLET | ORAL | Status: DC | PRN
  Administered 2021-06-09: 05:00:00 650 mg via ORAL
  Filled 2021-06-01: qty 2

## 2021-06-01 MED ORDER — BISACODYL 10 MG RE SUPP: 10.0000 mg | Freq: Every day | RECTAL | Status: DC | PRN

## 2021-06-01 MED ORDER — CARIPRAZINE HCL 1.5 MG PO CAPS
3.0000 mg | ORAL_CAPSULE | Freq: Every day | ORAL | Status: DC
  Administered 2021-06-02 – 2021-06-09 (×8): 3 mg via ORAL
  Filled 2021-06-01 (×6): qty 2
  Filled 2021-06-01: qty 1
  Filled 2021-06-01 (×2): qty 2
  Filled 2021-06-01: qty 1

## 2021-06-01 MED ORDER — ASPIRIN EC 81 MG PO TBEC
81.0000 mg | DELAYED_RELEASE_TABLET | Freq: Every day | ORAL | Status: DC
  Administered 2021-06-02 – 2021-06-09 (×8): 81 mg via ORAL
  Filled 2021-06-01 (×8): qty 1

## 2021-06-01 MED ORDER — ENOXAPARIN SODIUM 40 MG/0.4ML IJ SOSY
40.0000 mg | PREFILLED_SYRINGE | INTRAMUSCULAR | Status: DC
  Administered 2021-06-01: 40 mg via SUBCUTANEOUS
  Filled 2021-06-01: qty 0.4

## 2021-06-01 MED ORDER — GUAIFENESIN-DM 100-10 MG/5ML PO SYRP: 5.0000 mL | ORAL_SOLUTION | Freq: Four times a day (QID) | ORAL | Status: DC | PRN

## 2021-06-01 MED ORDER — DIPHENHYDRAMINE HCL 12.5 MG/5ML PO ELIX
12.5000 mg | ORAL_SOLUTION | Freq: Four times a day (QID) | ORAL | Status: DC | PRN
Start: 2021-06-01 — End: 2021-06-09

## 2021-06-01 MED ORDER — INSULIN REGULAR HUMAN (CONC) 500 UNIT/ML ~~LOC~~ SOPN
80.0000 [IU] | PEN_INJECTOR | Freq: Three times a day (TID) | SUBCUTANEOUS | Status: DC
  Administered 2021-06-01 – 2021-06-02 (×4): 80 [IU] via SUBCUTANEOUS
  Filled 2021-06-01: qty 3

## 2021-06-01 NOTE — TOC Transition Note (Signed)
Transition of Care [facility]) - CM/SW Discharge Note   Patient Details  Name: Jerry Wheeler MRN: [id number] Date of Birth: 1975/07/21  Transition of Care [facility]) CM/SW Contact:  Hetty Ely, RN Phone Number: 06/01/2021, 12:07 PM   Clinical Narrative:  Patient to transfer to CIR today.     Final next level of care: IP Rehab Facility Barriers to Discharge: Barriers Resolved   Patient Goals and CMS Choice        Discharge Placement              Patient chooses bed at:  (CIR) Patient to be transferred to facility by: CIR transport Name of family member notified: Yes by CIR Patient and family notified of of transfer: 06/01/21  Discharge Plan and Services                                     Social Determinants of Health (SDOH) Interventions     Readmission Risk Interventions No flowsheet data found.

## 2021-06-01 NOTE — Progress Notes (Incomplete)
INPATIENT REHABILITATION ADMISSION NOTE   Arrival Method:     Mental Orientation:   Assessment:   Skin:   IV'S:   Pain:   Tubes and Drains:   Safety Measures:   Vital Signs:   Height and Weight:   Rehab Orientation:   Family:    Notes:

## 2021-06-01 NOTE — Care Management Important Message (Signed)
Important Message  Patient Details  Name: Jerry Wheeler MRN: [id number] Date of Birth: 03-03-1976   Medicare Important Message Given:  Yes     Olegario Messier A Jenica Costilow 06/01/2021, 11:41 AM

## 2021-06-01 NOTE — H&P (Signed)
Physical Medicine and Rehabilitation Admission H&P    Chief Complaint  Patient presents with   Functional deficits in due to lumbar radiculopathy     In setting of diabetic neuropathy        HPI: Jerry Wheeler is a 45 year old male with history of poorly controlled T2DM with peripheral neuropathy, bipolar disorder, OSA who was admitted to [facility] on 05/26/21 with worsening of BLE weakness and  numbness for 3 weeks progressing to inability to walk x one day. Neurology consulted and MRI spine done revealing  severe canal stenosis L3/L4, moderate canal stenosis L2/L3 and mild to moderate canal stenosis with  moderate to severe left foraminal stenosis at L4/L5. LP done was bloody and revealing elevated protein-287, WBC 12 and glucose 122.  Protein elevated due to 4000k red cells and no pleocytosis with hyporeflexia felt to be due to diabetes and NS evaluation recommended by Dr. Quinn Axe.  Dr. Lacinda Axon consulted for input and as no compression signs noted, recommended aggressive management of DM and outpatient follow up for input on treatment as patient also not interested in surgery at this time. Therapy initiated and  patient continues to be limited by weakness with sensory deficits, balance deficits and fatigue affecting ADLs and mobility. CIR recommended due to functional decline.    Review of Systems  Constitutional: Negative.   HENT: Negative.    Eyes: Negative.   Respiratory: Negative.    Cardiovascular:  Positive for leg swelling.  Gastrointestinal: Negative.   Genitourinary: Negative.   Musculoskeletal: Negative.   Skin: Negative.   Neurological:  Positive for sensory change and weakness.  Endo/Heme/Allergies: Negative.   Psychiatric/Behavioral: Negative.      Past Medical History:  Diagnosis Date   Anxiety disorder    Bipolar disorder ([place])    Diabetes mellitus ([place])    Diabetic peripheral neuropathy associated with type 2 diabetes mellitus ([place])    Emphysema lung ([place])     hospitalization 1999   High blood pressure    Major depressive disorder in partial remission ([place])    Morbid obesity ([place])    Osteomyelitis of great toe of left foot ([place])    Sleep apnea    Tobacco use disorder, continuous    Ulcer of right heel ([place])    followed by podiatry--fully healed 09/2020     Past Surgical History:  Procedure Laterality Date   DG GREAT TOE LEFT FOOT  2010   s/p I and D for osteo    Family History  Problem Relation Age of Onset   Hypertension Mother     Social History:  Lives alone in an apartment on the 2nd floor. reports that he quit smoking about 8 years ago. His smoking use included cigarettes. He uses smokeless tobacco. He reports that he does not drink alcohol. No history on file for drug use.   Allergies: No Known Allergies   Medications Prior to Admission  Medication Sig Dispense Refill   amLODipine (NORVASC) 5 MG tablet Take 5 mg by mouth daily.     aspirin EC 81 MG tablet Take by mouth.     atorvastatin (LIPITOR) 40 MG tablet TAKE 1 TABLET BY MOUTH EVERY DAY     fenofibrate 160 MG tablet TAKE 1 TABLET BY MOUTH EVERY DAY     HUMULIN R U-500 KWIKPEN 500 UNIT/ML KwikPen 75-95 Units 3 (three) times daily with meals. 95 units 1200 with first meal 75 units 1800 with lunch 85 units 2400 with dinner  lamoTRIgine (LAMICTAL) 150 MG tablet Take 150 mg by mouth 2 (two) times daily.     lithium carbonate (LITHOBID) 300 MG CR tablet TAKE 1 IN THE MORNING AND 2 IN THE EVENING  1   metFORMIN (GLUCOPHAGE) 1000 MG tablet Take 1,000 mg by mouth 2 (two) times daily.  3   tirzepatide (MOUNJARO) 5 MG/0.5ML Pen Inject 5 mg into the skin every 7 (seven) days.     vitamin B-12 (CYANOCOBALAMIN) 1000 MCG tablet Take 1,000 mcg by mouth daily.     VRAYLAR 3 MG capsule Take 3 mg by mouth daily.     ACCU-CHEK AVIVA PLUS test strip U TID  1   blood glucose meter kit and supplies KIT Use daily to check blood sugars     Blood Glucose Monitoring Suppl (ACCU-CHEK AVIVA  PLUS) w/Device KIT U UTD D TO CHECK BS  0   hydrOXYzine (ATARAX/VISTARIL) 25 MG tablet Take by mouth. (Patient not taking: Reported on 05/26/2021)     insulin aspart (NOVOLOG) 100 UNIT/ML injection Inject 20 units three times daily plus 5 units for BG 181-220, and + 10 units for BG >221 before meals (Patient not taking: Reported on 05/26/2021)     insulin degludec (TRESIBA) 200 UNIT/ML FlexTouch Pen INJECT 110 UNITS SUBCUTANEOUSLY NIGHTLY. (Patient not taking: Reported on 05/26/2021)     Insulin Syringe-Needle U-100 31G X 5/16" 1 ML MISC USE AS DIRECTED 3 TIMES DAILY     lamoTRIgine (LAMICTAL) 200 MG tablet Take by mouth. (Patient not taking: Reported on 05/26/2021)     lisinopril (PRINIVIL,ZESTRIL) 10 MG tablet Take 10 mg by mouth every morning. (Patient not taking: Reported on 05/26/2021)  3   OZEMPIC, 1 MG/DOSE, 4 MG/3ML SOPN Inject 1 mg into the skin every 7 (seven) days. (Patient not taking: Reported on 05/26/2021)     VICTOZA 18 MG/3ML SOPN INJECT 0.3 MLS (1.8 MG TOTAL) SUBCUTANEOUSLY ONCE DAILY (Patient not taking: Reported on 05/26/2021)  3    Drug Regimen Review  Drug regimen was reviewed and remains appropriate with no significant issues identified    Physical Exam: Blood pressure 139/81, pulse 84, temperature 97.7 F (36.5 C), temperature source Oral, resp. rate 18, height 6' 7" (2.007 m), weight (!) 156 kg, SpO2 98 %. Physical Exam Gen: no distress, normal appearing, obese BMI 38.74 HEENT: oral mucosa pink and moist, NCAT Cardio: Reg rate Chest: normal effort, normal rate of breathing Abd: soft, non-distended Ext: no edema Psych: pleasant, normal affect Skin: intact Neuro: Alert and oriented x4. Musculoskeletal: 5/5 sensation upper and lower extremities except 4/5 DF and left foot is everted with loss of arch support. Decreased sensation and proprioception.   Results for orders placed or performed during the hospital encounter of 05/26/21 (from the past 48 hour(s))  Glucose,  capillary     Status: Abnormal   Collection Time: 05/30/21  4:36 PM  Result Value Ref Range   Glucose-Capillary 229 (H) 70 - 99 mg/dL    Comment: Glucose reference range applies only to samples taken after fasting for at least 8 hours.  Glucose, capillary     Status: Abnormal   Collection Time: 05/30/21  9:12 PM  Result Value Ref Range   Glucose-Capillary 143 (H) 70 - 99 mg/dL    Comment: Glucose reference range applies only to samples taken after fasting for at least 8 hours.  Glucose, capillary     Status: None   Collection Time: 05/31/21  8:00 AM  Result Value Ref Range  Glucose-Capillary 89 70 - 99 mg/dL    Comment: Glucose reference range applies only to samples taken after fasting for at least 8 hours.  Glucose, capillary     Status: Abnormal   Collection Time: 05/31/21 12:20 PM  Result Value Ref Range   Glucose-Capillary 154 (H) 70 - 99 mg/dL    Comment: Glucose reference range applies only to samples taken after fasting for at least 8 hours.  Glucose, capillary     Status: None   Collection Time: 05/31/21  4:45 PM  Result Value Ref Range   Glucose-Capillary 87 70 - 99 mg/dL    Comment: Glucose reference range applies only to samples taken after fasting for at least 8 hours.  Glucose, capillary     Status: None   Collection Time: 05/31/21  7:37 PM  Result Value Ref Range   Glucose-Capillary 72 70 - 99 mg/dL    Comment: Glucose reference range applies only to samples taken after fasting for at least 8 hours.  CBC     Status: Abnormal   Collection Time: 06/01/21  5:25 AM  Result Value Ref Range   WBC 8.6 4.0 - 10.5 K/uL   RBC 4.69 4.22 - 5.81 MIL/uL   Hemoglobin 12.7 (L) 13.0 - 17.0 g/dL   HCT 39.6 39.0 - 52.0 %   MCV 84.4 80.0 - 100.0 fL   MCH 27.1 26.0 - 34.0 pg   MCHC 32.1 30.0 - 36.0 g/dL   RDW 13.2 11.5 - 15.5 %   Platelets 268 150 - 400 K/uL   nRBC 0.0 0.0 - 0.2 %    Comment: Performed at [facility], [address] [place], [place] [address]  Basic  metabolic panel     Status: None   Collection Time: 06/01/21  5:25 AM  Result Value Ref Range   Sodium 138 135 - 145 mmol/L   Potassium 3.8 3.5 - 5.1 mmol/L   Chloride 107 98 - 111 mmol/L   CO2 24 22 - 32 mmol/L   Glucose, Bld 81 70 - 99 mg/dL    Comment: Glucose reference range applies only to samples taken after fasting for at least 8 hours.   BUN 15 6 - 20 mg/dL   Creatinine, Ser 0.93 0.61 - 1.24 mg/dL   Calcium 9.8 8.9 - 10.3 mg/dL   GFR, Estimated >60 >60 mL/min    Comment: (NOTE) Calculated using the CKD-EPI Creatinine Equation (2021)    Anion gap 7 5 - 15    Comment: Performed at [facility], [address] [place], [place] [address]  Glucose, capillary     Status: None   Collection Time: 06/01/21  7:59 AM  Result Value Ref Range   Glucose-Capillary 94 70 - 99 mg/dL    Comment: Glucose reference range applies only to samples taken after fasting for at least 8 hours.  Glucose, capillary     Status: Abnormal   Collection Time: 06/01/21 11:41 AM  Result Value Ref Range   Glucose-Capillary 156 (H) 70 - 99 mg/dL    Comment: Glucose reference range applies only to samples taken after fasting for at least 8 hours.   No results found.     Medical Problem List and Plan: 1. Functional deficits secondary to diabetic peripheral neuropathy and severe lumbar spinal stenosis.  -patient may shower  -ELOS/Goals: 1 week   Admit to CIR 2.  Impaired mobility, ambulating 20 feet: continue Lovenox.  3. Pain Management: N/A 10. Bipolar disorder w/ anxiety:  Continue Lamictal,  Vaylar and Lithium  --Followed by Dr. Kasandra Knudsen (Psychiatry in [place]) 5. Neuropsych: This patient is capable of making decisions on her own behalf. 6. Skin/Wound Care: Routine pressure relief measures. 7. Fluids/Electrolytes/Nutrition: Monitor I/O- 8. T2DM: Hgb A1C-11.3 and poorly controlled. Per records out of ozempic X 4 weeks due to national back order.   --Per 12/07 endocrine visit--> on Humulin R-500  95 units am/75 units lunch/85 units supper and was to transition to Mounjaro 5 mg/day. Continue to hold metformin  --monitor BS ac/hs and titrate insulin as indicated --SSI has not been used as patient appear brittle.   --blood sugars have trended down to 70's the past 24 hours -->will decrease to 80  units TID per recommendation by DM coordinator.   --monitor for further changes with increase in activity.  Discussed that impaired sensation and proprioception from diabetes likely large contributor to his symptoms and inpatient rehab can help with compensatory strategies to improve balance and navigation of stairs. Discussed outpatient Qutenza as an option to improve sensation. 9. HTN: Monitor BP TID. Continue Norvasc daily 11. OSA: Resume CPAP 12.Chronic insomnia: Managed by Hydralazine HS prn? 13.  H/o emphysema: Has on going use of E-ciggs 14. Gastroparesis: Intermittent N/V reported and likely has gastroparesis but will  order KUB to evaluate stool burden.   I have personally performed a face to face diagnostic evaluation, including, but not limited to relevant history and physical exam findings, of this patient and developed relevant assessment and plan.  Additionally, I have reviewed and concur with the physician assistant's documentation above.  Bary Leriche, PA-C    Izora Ribas, MD 06/01/2021

## 2021-06-01 NOTE — Progress Notes (Signed)
Called report to Gwendolyn Lima RN at [facility], patient is going to room 9 [place] [address] [place].

## 2021-06-01 NOTE — Progress Notes (Signed)
Inpatient Rehabilitation Admission Medication Review by a Pharmacist  A complete drug regimen review was completed for this patient to identify any potential clinically significant medication issues.  High Risk Drug Classes Is patient taking? Indication by Medication  Antipsychotic Yes Cariprazine and Lithium for Bipolar, Compazine for nausea  Anticoagulant Yes Lovenox for VTE prophx.  Antibiotic No   Opioid No   Antiplatelet No   Hypoglycemics/insulin Yes U500 for DM  Vasoactive Medication Yes Norvasc for HTN  Chemotherapy No   Other No      Type of Medication Issue Identified Description of Issue Recommendation(s)  Drug Interaction(s) (clinically significant)     Duplicate Therapy     Allergy     No Medication Administration End Date     Incorrect Dose     Additional Drug Therapy Needed  ASA 81mg /d, Metformin, Moujaro Resume ASA81mg /d and Metformin. Moujaro not stocked-HOLD.  Significant med changes from prior encounter (inform family/care partners about these prior to discharge).    Other       Clinically significant medication issues were identified that warrant physician communication and completion of prescribed/recommended actions by midnight of the next day:  Yes  Name of provider notified for urgent issues identified:  Provider Method of Notification: chat    Pharmacist comments: will f/u in AM  Time spent performing this drug regimen review (minutes):  10-83min  Oriah Leinweber S. 12m, PharmD, BCPS Clinical Staff Pharmacist [email or web address] Merilynn [place] 06/01/2021 3:49 PM

## 2021-06-01 NOTE — H&P (Shared)
Physical Medicine and Rehabilitation Admission H&P    Chief Complaint  Patient presents with   Functional deficits in due to lumbar radiculopathy     In setting of diabetic neuropathy        HPI: Jerry Wheeler is a 45 year old male with history of poorly controlled T2DM with peripheral neuropathy, bipolar disorder, OSA who was admitted to [facility] on 05/26/21 with worsening of BLE weakness and  numbness for 3 weeks progressing to inability to walk x one day. Neurology consulted and MRI spine done revealing  severe canal stenosis L3/L4, moderate canal stenosis L2/L3 and mild to moderate canal stenosis with  moderate to severe left foraminal stenosis at L4/L5. LP done was bloody and revealing elevated protein-287, WBC 12 and glucose 122.  Protein elevated due to 4000k red cells and no pleocytosis with hyporeflexia felt to be due to diabetes and NS evaluation recommended by Dr. Quinn Axe.  Dr. Lacinda Axon consulted for input and as no compression signs noted, recommended aggressive management of DM and outpatient follow up for input on treatment as patient also not interested in surgery at this time. Therapy initiated and  patient continues to be limited by weakness with sensory deficits, balance deficits and fatigue affecting ADLs and mobility. CIR recommended due to functional decline.    ROS   Past Medical History:  Diagnosis Date   Anxiety disorder    Bipolar disorder ([place])    Diabetes mellitus ([place])    Diabetic peripheral neuropathy associated with type 2 diabetes mellitus ([place])    Emphysema lung ([place])    hospitalization 1999   High blood pressure    Major depressive disorder in partial remission ([place])    Morbid obesity ([place])    Osteomyelitis of great toe of left foot ([place])    Sleep apnea    Tobacco use disorder, continuous    Ulcer of right heel ([place])    followed by podiatry--fully healed 09/2020     Past Surgical History:  Procedure Laterality Date   DG GREAT TOE LEFT FOOT  2010    s/p I and D for osteo    Family History  Problem Relation Age of Onset   Hypertension Mother     Social History:  Lives alone in an apartment on the 2nd floor. reports that he quit smoking about 8 years ago. His smoking use included cigarettes. He uses smokeless tobacco. He reports that he does not drink alcohol. No history on file for drug use.   Allergies: No Known Allergies   Medications Prior to Admission  Medication Sig Dispense Refill   amLODipine (NORVASC) 5 MG tablet Take 5 mg by mouth daily.     aspirin EC 81 MG tablet Take by mouth.     atorvastatin (LIPITOR) 40 MG tablet TAKE 1 TABLET BY MOUTH EVERY DAY     fenofibrate 160 MG tablet TAKE 1 TABLET BY MOUTH EVERY DAY     HUMULIN R U-500 KWIKPEN 500 UNIT/ML KwikPen 75-95 Units 3 (three) times daily with meals. 95 units 1200 with first meal 75 units 1800 with lunch 85 units 2400 with dinner     lamoTRIgine (LAMICTAL) 150 MG tablet Take 150 mg by mouth 2 (two) times daily.     lithium carbonate (LITHOBID) 300 MG CR tablet TAKE 1 IN THE MORNING AND 2 IN THE EVENING  1   metFORMIN (GLUCOPHAGE) 1000 MG tablet Take 1,000 mg by mouth 2 (two) times daily.  3   tirzepatide [facility]) 5  MG/0.5ML Pen Inject 5 mg into the skin every 7 (seven) days.     vitamin B-12 (CYANOCOBALAMIN) 1000 MCG tablet Take 1,000 mcg by mouth daily.     VRAYLAR 3 MG capsule Take 3 mg by mouth daily.     ACCU-CHEK AVIVA PLUS test strip U TID  1   blood glucose meter kit and supplies KIT Use daily to check blood sugars     Blood Glucose Monitoring Suppl (ACCU-CHEK AVIVA PLUS) w/Device KIT U UTD D TO CHECK BS  0   hydrOXYzine (ATARAX/VISTARIL) 25 MG tablet Take by mouth. (Patient not taking: Reported on 05/26/2021)     insulin aspart (NOVOLOG) 100 UNIT/ML injection Inject 20 units three times daily plus 5 units for BG 181-220, and + 10 units for BG >221 before meals (Patient not taking: Reported on 05/26/2021)     insulin degludec (TRESIBA) 200 UNIT/ML  FlexTouch Pen INJECT 110 UNITS SUBCUTANEOUSLY NIGHTLY. (Patient not taking: Reported on 05/26/2021)     Insulin Syringe-Needle U-100 31G X 5/16" 1 ML MISC USE AS DIRECTED 3 TIMES DAILY     lamoTRIgine (LAMICTAL) 200 MG tablet Take by mouth. (Patient not taking: Reported on 05/26/2021)     lisinopril (PRINIVIL,ZESTRIL) 10 MG tablet Take 10 mg by mouth every morning. (Patient not taking: Reported on 05/26/2021)  3   OZEMPIC, 1 MG/DOSE, 4 MG/3ML SOPN Inject 1 mg into the skin every 7 (seven) days. (Patient not taking: Reported on 05/26/2021)     VICTOZA 18 MG/3ML SOPN INJECT 0.3 MLS (1.8 MG TOTAL) SUBCUTANEOUSLY ONCE DAILY (Patient not taking: Reported on 05/26/2021)  3    Drug Regimen Review { DRUG REGIMEN [id number]}  Home: Home Living Family/patient expects to be discharged to:: Private residence Living Arrangements: Alone Available Help at Discharge: Family Type of Home: Apartment Home Layout: Two level, Bed/bath upstairs Alternate Level Stairs-Number of Steps: 14 Alternate Level Stairs-Rails: Right Bathroom Shower/Tub: Tub/shower unit, Architectural technologist: Standard Home Equipment: None  Lives With: Alone   Functional History: Prior Function Prior Level of Function : Independent/Modified Independent, Driving Mobility Comments: Pt reports he was previously independent with ambulation, reports decreased balance over the last 3 weeks, does not recall any events that would have affected his balance.  Reports his bedroom and only bathroom are located upstairs, reports he does well if he has a handrail which he does have going up and down the stairs however reports when stepping down off his porch there is no handrail. ADLs Comments: Pt reports being independent prior with self care and IADL tasks.  He does not work and is currently on disability.  He reports decreased endurance over the last 3 weeks and progressive leg weakness when attempting to perform tasks in standing.  Pt has bilateral  LE neuropathy and appears to demonstrate decreased proprioception when vision is occluded (for example when drying his hair off with towel and towel is covering his eyes.  Pt has bilateral tremors in his UEs and reports he has had this for years.  Functional Status:  Mobility: Bed Mobility Overal bed mobility: Modified Independent General bed mobility comments: heavy use of bed rails and head of bed was slightly elevated Transfers Overall transfer level: Needs assistance Equipment used: Rolling walker (2 wheels) Transfers: Sit to/from Stand Sit to Stand: Min guard General transfer comment: CGA for safety Ambulation/Gait Ambulation/Gait assistance: Min guard, Min assist Gait Distance (Feet): 50 Feet Assistive device: 1 person hand held assist Gait Pattern/deviations: Staggering left, Staggering right, Drifts right/left  General Gait Details: Pt was able to ambulate 50 ft without AD. did require +1 HHA at times to prevent LOB/unsteadiness Gait velocity: decreased    ADL: ADL Overall ADL's : Needs assistance/impaired Eating/Feeding: Modified independent Grooming: Wash/dry hands, Wash/dry face, Minimal assistance, Standing Grooming Details (indicate cue type and reason): MIN A for steadying during grooming tasks requiring b/l UE. MIN GUARD with single UE supported Upper Body Bathing: Modified independent Lower Body Bathing Details (indicate cue type and reason): Pt can demonstrate from seated position but would be unsafe currently to shower in standing to manage LE bathing Upper Body Dressing : Modified independent Lower Body Dressing: Min guard, Sitting/lateral leans Lower Body Dressing Details (indicate cue type and reason): To grab shoes from floor and don/doff socks & shoes while seated EOB Toilet Transfer: Min guard, Rolling walker (2 wheels) Toileting- Clothing Manipulation and Hygiene: Min guard, Minimal assistance Functional mobility during ADLs: Min guard, Rolling walker (2  wheels)  Cognition: Cognition Overall Cognitive Status: Within Functional Limits for tasks assessed Orientation Level: Oriented X4 Cognition Arousal/Alertness: Awake/alert Behavior During Therapy: WFL for tasks assessed/performed Overall Cognitive Status: Within Functional Limits for tasks assessed General Comments: Pt is A and O x 4.Cooperative and pleasant. Motivated to get better and return to PLOF  Physical Exam: Blood pressure 128/83, pulse 80, temperature 98.1 F (36.7 C), temperature source Oral, resp. rate 18, height 6' 7"  (2.007 m), weight (!) 156.5 kg, SpO2 96 %. Physical Exam  Results for orders placed or performed during the hospital encounter of 05/26/21 (from the past 48 hour(s))  Glucose, capillary     Status: Abnormal   Collection Time: 05/30/21  4:36 PM  Result Value Ref Range   Glucose-Capillary 229 (H) 70 - 99 mg/dL    Comment: Glucose reference range applies only to samples taken after fasting for at least 8 hours.  Glucose, capillary     Status: Abnormal   Collection Time: 05/30/21  9:12 PM  Result Value Ref Range   Glucose-Capillary 143 (H) 70 - 99 mg/dL    Comment: Glucose reference range applies only to samples taken after fasting for at least 8 hours.  Glucose, capillary     Status: None   Collection Time: 05/31/21  8:00 AM  Result Value Ref Range   Glucose-Capillary 89 70 - 99 mg/dL    Comment: Glucose reference range applies only to samples taken after fasting for at least 8 hours.  Glucose, capillary     Status: Abnormal   Collection Time: 05/31/21 12:20 PM  Result Value Ref Range   Glucose-Capillary 154 (H) 70 - 99 mg/dL    Comment: Glucose reference range applies only to samples taken after fasting for at least 8 hours.  Glucose, capillary     Status: None   Collection Time: 05/31/21  4:45 PM  Result Value Ref Range   Glucose-Capillary 87 70 - 99 mg/dL    Comment: Glucose reference range applies only to samples taken after fasting for at least 8  hours.  Glucose, capillary     Status: None   Collection Time: 05/31/21  7:37 PM  Result Value Ref Range   Glucose-Capillary 72 70 - 99 mg/dL    Comment: Glucose reference range applies only to samples taken after fasting for at least 8 hours.  CBC     Status: Abnormal   Collection Time: 06/01/21  5:25 AM  Result Value Ref Range   WBC 8.6 4.0 - 10.5 K/uL   RBC 4.69 4.22 -  5.81 MIL/uL   Hemoglobin 12.7 (L) 13.0 - 17.0 g/dL   HCT 39.6 39.0 - 52.0 %   MCV 84.4 80.0 - 100.0 fL   MCH 27.1 26.0 - 34.0 pg   MCHC 32.1 30.0 - 36.0 g/dL   RDW 13.2 11.5 - 15.5 %   Platelets 268 150 - 400 K/uL   nRBC 0.0 0.0 - 0.2 %    Comment: Performed at [facility], [address], [place], [place] [address]  Basic metabolic panel     Status: None   Collection Time: 06/01/21  5:25 AM  Result Value Ref Range   Sodium 138 135 - 145 mmol/L   Potassium 3.8 3.5 - 5.1 mmol/L   Chloride 107 98 - 111 mmol/L   CO2 24 22 - 32 mmol/L   Glucose, Bld 81 70 - 99 mg/dL    Comment: Glucose reference range applies only to samples taken after fasting for at least 8 hours.   BUN 15 6 - 20 mg/dL   Creatinine, Ser 0.93 0.61 - 1.24 mg/dL   Calcium 9.8 8.9 - 10.3 mg/dL   GFR, Estimated >60 >60 mL/min    Comment: (NOTE) Calculated using the CKD-EPI Creatinine Equation (2021)    Anion gap 7 5 - 15    Comment: Performed at [facility], [address] [place], [place] [address]  Glucose, capillary     Status: None   Collection Time: 06/01/21  7:59 AM  Result Value Ref Range   Glucose-Capillary 94 70 - 99 mg/dL    Comment: Glucose reference range applies only to samples taken after fasting for at least 8 hours.  Glucose, capillary     Status: Abnormal   Collection Time: 06/01/21 11:41 AM  Result Value Ref Range   Glucose-Capillary 156 (H) 70 - 99 mg/dL    Comment: Glucose reference range applies only to samples taken after fasting for at least 8 hours.   No results found.     Medical Problem  List and Plan: 1. Functional deficits secondary to ***  -patient may *** shower  -ELOS/Goals: *** 2.  Antithrombotics: -DVT/anticoagulation:  Pharmaceutical: Lovenox added  -antiplatelet therapy: N/A 3. Pain Management:  4. Mood: LCSW to follow for evaluation and support.   -antipsychotic agents: Vaylar 5. Neuropsych: This patient *** capable of making decisions on *** own behalf. 6. Skin/Wound Care: Routine pressure relief measures. 7. Fluids/Electrolytes/Nutrition: Monitor I/O- 8. T2DM: Hgb A1C-11.3 and poorly controlled. Per records out of ozempic X 4 weeks due to national back order.   --Per 12/07 endocrine visit--> on Humulin R-500 95 units am/75 units lunch/85 units supper and was to transition to Mounjaro 5 mg/day. Continue to hold metformin  --monitor BS ac/hs and titrate insulin as indicated --SSI has not been used as patient appear brittle.   --blood sugars have trended down to 70's the past 24 hours -->will decrease to 80  units TID per recommendation by DM coordinator.   --monitor for further changes with increase in activity.  9. HTN: Monitor BP TID. Continue Norvasc daily 10. Bipolar disorder w/ anxiety: Has been stable on Lamictal,  Vaylar and Lithium  --Followed by Dr. Kasandra Knudsen (Psychiatry in [place]) 11. OSA: Resume CPAP 12.Chronic insomnia: Managed by Hydralazine HS prn? 13.  H/o emphysema: Has on going use of E-ciggs 14. Gastroparesis: Intermittent N/V reported and likely has gastroparesis but will  order KUB to evaluate stool burden.   ***  Bary Leriche, PA-C 06/01/2021

## 2021-06-01 NOTE — Progress Notes (Signed)
Inpatient Diabetes Program Recommendations  AACE/ADA: New Consensus Statement on Inpatient Glycemic Control  Target Ranges:  Prepandial:   less than 140 mg/dL      Peak postprandial:   less than 180 mg/dL (1-2 hours)      Critically ill patients:  140 - 180 mg/dL    Latest Reference Range & Units 06/01/21 07:59  Glucose-Capillary 70 - 99 mg/dL 94    Latest Reference Range & Units 05/31/21 08:00 05/31/21 12:20 05/31/21 16:45 05/31/21 19:37  Glucose-Capillary 70 - 99 mg/dL 89 353 (H) 87 72   Review of Glycemic Control  Diabetes history: DM2 Outpatient Diabetes medications: Humulin R U500 95 units at 12:00 with first meal, 75 units at 18:00 with lunch, 85 units at midnight with dinner, Metformin 1000 mg BID, Mounjaro 5 mg Qweek Current orders for Inpatient glycemic control: Humulin R U500 85 units TID with meals  Inpatient Diabetes Program Recommendations:    Insulin: May want to consider decreasing Humulin R U500 to 80 units TID with meals.  Thanks, Orlando Penner, RN, MSN, CDE Diabetes Coordinator Inpatient Diabetes Program [phone] (Team Pager from 8am to 5pm)

## 2021-06-01 NOTE — Progress Notes (Addendum)
[facility] rehab admissions - I have a bed available at [facility] on CIR today if patient is medically ready and would like to come to rehab.  Please let me know.  [phone]  Attending MD has cleared patient for admit to CIR today.  I will let you know soon about pick up time (for after lunch likely).    I have called [facility] and asked for pick up around 2 pm today.  Please have nurse call report to [phone] and room number is [facility] 09 and Dr. Carlis Abbott will be accepting patient for Dr. Berline Chough.

## 2021-06-01 NOTE — Discharge Summary (Addendum)
Physician Discharge Summary  Tylon Kemmerling YKZ:[id number] DOB: 1975-07-22 DOA: 05/26/2021  PCP: Dion Body, MD  Admit date: 05/26/2021 Discharge date: 06/01/2021  Admitted From: home  Disposition:  CIR  Recommendations for Outpatient Follow-up:  Follow up with PCP in 1-2 weeks F/u w/ neuro surg, Dr. Lacinda Axon, in 1-2 weeks F/u w/ neuro in 1-2 weeks    Home Health: no  Equipment/Devices:  Discharge Condition: stable CODE STATUS: full  Diet recommendation: Heart Healthy / Carb Modified   Brief/Interim Summary: HPI was taken from Dr. Francine Graven: Jary Louvier is a 45 y.o. male with medical history significant for morbid obesity, diabetes mellitus, bipolar disorder and hypertension who presents to the emergency room for evaluation of weakness and inability to ambulate. Patient states that he has peripheral neuropathy related to his underlying diabetes mellitus last 3 weeks he has had progressive lower extremity and 1 day prior to his admission he fell and was able to stand up.  He states that his balance is off and he is able to ambulate without holding onto something.  He denies having any back pain and denies having any urinary or fecal incontinence.  He denies having any fever, no chills, no cough or congestion.  He denies having any recent viral infection. He denies having any chest pain, no shortness of breath, no nausea, no vomiting, no abdominal pain, no diarrhea, no dizziness, no lightheadedness, no palpitations, no diaphoresis, no difficulty swallowing, no leg swelling, no focal deficit or blurred vision. Sodium 138, potassium 3.5, chloride 108, bicarb 23, glucose 170, BUN 17, creatinine 1.04, calcium 10.2, troponin 37, white count 9.6, hemoglobin 13.6, hematocrit 42.2, MCV 85.8, RDW 13.5, platelet count 328, lithium level 0.52 Respiratory viral panel is negative Twelve-lead EKG reviewed by me shows normal sinus rhythm with LVH     ED Course: Patient is a 45 year old male  who presents to the ER for evaluation of worsening lower extremity weakness and numbness.  Patient states symptoms started about 3 weeks ago and have progressively worsened.  He is unable to ambulate without holding onto surfaces due to an unsteady gait. He will be admitted to the hospital for further evaluation.   As per Dr. Mal Misty: Harbor Vanover is a 45 y.o. male with medical history significant for type II DM, peripheral neuropathy, hypertension, bipolar disorder, who presented to the hospital because of progressive generalized weakness and inability to ambulate.  He fell the day prior to admission and was unable to stand up.   As per Dr. Jimmye Norman 12/14-12/15/22: Pt was medically stable for d/c to CIR and CIR bed became available 06/01/21. Please see previous progress/consult notes for more information.   Discharge Diagnoses:  Principal Problem:   Lower extremity weakness Active Problems:   Bipolar disorder ([place])   DM type 2 with diabetic peripheral neuropathy (HCC)   Recurrent major depressive disorder, in partial remission ([place])   Obesity (BMI 35.0-39.9 without comorbidity)   Unsteady gait   Fall at home, initial encounter  Unsteady gait/lower extremity weakness: s/p fall at home. Possibly secondary to peripheral neuropathy. LP yielded a bloody tap but no need for repeat LP per neurologist. PT/OT recs CIR.  Chronic lumbar stenosis: L2-L4.  No plan for surgical intervention.  Outpatient follow-up with neurosurgeon for further management.   Occasional episodes of nausea and vomiting: Improved. Zofran prn. Recommended outpatient evaluation for gastroparesis.   DM2: likely poorly controlled. Continue on home dose of metformin, mounjaro, & humulin pen home regimen. Consider changing humulin to 80 units TID  w/ meals while in CIR    Bipolar disorder: continue on home dose of lamictal, vraylar & lithium   HTN: continue on amlodipine   Discharge Instructions  Discharge  Instructions     Ambulatory referral to Neurology   Complete by: As directed    An appointment is requested in approximately: 6 wks   Diet - low sodium heart healthy   Complete by: As directed    Diet Carb Modified   Complete by: As directed    Discharge instructions   Complete by: As directed    F/u w/ PCP in 1-2 weeks. F/u w/ neuro surg, Dr. Lacinda Axon, in 1-2 weeks. F/u w/ neuro in 1-2 weeks   Increase activity slowly   Complete by: As directed       Allergies as of 06/01/2021   No Known Allergies      Medication List     STOP taking these medications    hydrOXYzine 25 MG tablet Commonly known as: ATARAX   insulin aspart 100 UNIT/ML injection Commonly known as: novoLOG   insulin degludec 200 UNIT/ML FlexTouch Pen Commonly known as: TRESIBA   lisinopril 10 MG tablet Commonly known as: ZESTRIL   Ozempic (1 MG/DOSE) 4 MG/3ML Sopn Generic drug: Semaglutide (1 MG/DOSE)   Victoza 18 MG/3ML Sopn Generic drug: liraglutide       TAKE these medications    Accu-Chek Aviva Plus test strip Generic drug: glucose blood U TID   Accu-Chek Aviva Plus w/Device Kit U UTD D TO CHECK BS   amLODipine 5 MG tablet Commonly known as: NORVASC Take 5 mg by mouth daily.   aspirin EC 81 MG tablet Take by mouth.   atorvastatin 40 MG tablet Commonly known as: LIPITOR TAKE 1 TABLET BY MOUTH EVERY DAY   blood glucose meter kit and supplies Kit Use daily to check blood sugars   fenofibrate 160 MG tablet TAKE 1 TABLET BY MOUTH EVERY DAY   HumuLIN R U-500 KwikPen 500 UNIT/ML KwikPen Generic drug: insulin regular human CONCENTRATED 75-95 Units 3 (three) times daily with meals. 95 units 1200 with first meal 75 units 1800 with lunch 85 units 2400 with dinner   Insulin Syringe-Needle U-100 31G X 5/16" 1 ML Misc USE AS DIRECTED 3 TIMES DAILY   lamoTRIgine 150 MG tablet Commonly known as: LAMICTAL Take 150 mg by mouth 2 (two) times daily. What changed: Another medication with  the same name was removed. Continue taking this medication, and follow the directions you see here.   lithium carbonate 300 MG CR tablet Commonly known as: LITHOBID Take 1 tablet (300 mg total) by mouth 2 (two) times daily. What changed: See the new instructions.   metFORMIN 1000 MG tablet Commonly known as: GLUCOPHAGE Take 1,000 mg by mouth 2 (two) times daily.   Mounjaro 5 MG/0.5ML Pen Generic drug: tirzepatide Inject 5 mg into the skin every 7 (seven) days.   vitamin B-12 1000 MCG tablet Commonly known as: CYANOCOBALAMIN Take 1,000 mcg by mouth daily.   Vraylar 3 MG capsule Generic drug: cariprazine Take 3 mg by mouth daily.        No Known Allergies  Consultations: Neuro surg Neuro, Dr. Quinn Axe   Procedures/Studies: MR CERVICAL SPINE W WO CONTRAST  Result Date: 05/26/2021 CLINICAL DATA:  Neuro deficit, acute, stroke suspected.  Weakness EXAM: MRI CERVICAL SPINE WITHOUT AND WITH CONTRAST TECHNIQUE: Multiplanar and multiecho pulse sequences of the cervical spine, to include the craniocervical junction and cervicothoracic junction, were obtained without and  with intravenous contrast. CONTRAST:  41m GADAVIST GADOBUTROL 1 MMOL/ML IV SOLN COMPARISON:  None. FINDINGS: Technical Note: Despite efforts by the technologist and patient, motion artifact is present on today's exam and could not be eliminated. This reduces exam sensitivity and specificity. Alignment: Physiologic. Vertebrae: No fracture, evidence of discitis, or bone lesion. Cord: Normal signal and morphology. Posterior Fossa, vertebral arteries, paraspinal tissues: Negative. Disc levels: C2-C3: No significant disc protrusion, foraminal stenosis, or canal stenosis. C3-C4: Minimal disc bulge with right sided uncovertebral spurring. No foraminal or canal stenosis. C4-C5: No disc bulge. Minimal right uncovertebral spurring. No foraminal or canal stenosis. C5-C6: No disc bulge. Minimal right uncovertebral spurring. No foraminal  or canal stenosis. C6-C7: Shallow central disc protrusion. No foraminal or canal stenosis. C7-T1: No significant disc protrusion, foraminal stenosis, or canal stenosis. IMPRESSION: 1. No acute findings.  No abnormal cord signal. 2. Early mild cervical spondylosis. No significant foraminal or canal stenosis at any level. Electronically Signed   By: NDavina PokeD.O.   On: 05/26/2021 14:29   MR THORACIC SPINE W WO CONTRAST  Result Date: 05/26/2021 CLINICAL DATA:  Trunk numbness or tingling weakness in both legs, unable to walk EXAM: MRI THORACIC WITHOUT AND WITH CONTRAST TECHNIQUE: Multiplanar and multiecho pulse sequences of the thoracic spine were obtained without and with intravenous contrast. CONTRAST:  165mGADAVIST GADOBUTROL 1 MMOL/ML IV SOLN COMPARISON:  None. FINDINGS: Alignment:  Physiologic. Vertebrae: No fracture, evidence of discitis, or bone lesion. Cord:  Normal signal and morphology. Paraspinal and other soft tissues: Negative. Disc levels: T1-T2: No significant canal or neural foraminal narrowing. T2-T3: No significant canal or neural foraminal narrowing. T3-T4: No significant canal or neural foraminal narrowing. T4-T5: No significant canal or neural foraminal narrowing. T5-T6: Shallow central disc protrusion. No significant canal or neural foraminal narrowing. T6-T7: No significant canal or neural foraminal narrowing. T7-T8: No significant canal or neural foraminal narrowing. T8-T9: No disc protrusion. Mild bilateral facet arthropathy. Borderline-mild bilateral foraminal stenosis. No canal stenosis. T9-T10: No disc protrusion. Mild bilateral facet arthropathy. Mild bilateral foraminal stenosis. No canal stenosis. T10-T11: No disc protrusion. Mild bilateral facet arthropathy. Mild bilateral foraminal stenosis. No canal stenosis. T11-T12: No significant canal or neural foraminal narrowing. IMPRESSION: 1. No acute findings. No evidence of cord compression or abnormal cord signal. 2. Mild  degenerative changes of the thoracic spine with mild bilateral foraminal stenosis at T9-T10 and T10-T11. No canal stenosis at any level. Electronically Signed   By: NiDavina Poke.O.   On: 05/26/2021 14:15   MR Lumbar Spine W Wo Contrast  Result Date: 05/26/2021 CLINICAL DATA:  Low back pain, cauda equina syndrome suspected Myelopathy, acute, lumbar spine weakness in both legs, can not walk EXAM: MRI LUMBAR SPINE WITHOUT AND WITH CONTRAST TECHNIQUE: Multiplanar and multiecho pulse sequences of the lumbar spine were obtained without and with intravenous contrast. CONTRAST:  1074mADAVIST GADOBUTROL 1 MMOL/ML IV SOLN COMPARISON:  None. FINDINGS: Segmentation:  Standard. Alignment: Mild lumbar levocurvature. Slight retrolisthesis of L3 on L4. Vertebrae:  No fracture, evidence of discitis, or bone lesion. Conus medullaris and cauda equina: Conus extends to the T12 level. Conus and cauda equina appear normal. Paraspinal and other soft tissues: Negative. Disc levels: T12-L1: Unremarkable disc. Mild bilateral facet hypertrophy. No foraminal or canal stenosis. L1-L2: Minimal annular disc bulge. Mild bilateral facet arthropathy. No foraminal or canal stenosis. L2-L3: Diffuse disc bulge with right foraminal disc protrusion. Mild bilateral facet arthropathy. Moderate canal stenosis. No significant foraminal stenosis. L3-L4: Diffuse disc bulge  with central disc protrusion. Moderate bilateral facet arthropathy. Severe canal stenosis with mild bilateral foraminal stenosis. L4-L5: Diffuse disc bulge, eccentric to the left. Moderate bilateral facet arthropathy. Findings result in mild-to-moderate canal stenosis with moderate-severe left foraminal stenosis. L5-S1: Mild central disc protrusion. Moderate bilateral facet hypertrophy. No foraminal or canal stenosis. IMPRESSION: 1. Multilevel lumbar spondylosis, as detailed above. 2. Severe canal stenosis at L3-L4. 3. Moderate canal stenosis at L2-L3. 4. Mild-to-moderate canal  stenosis with moderate-severe left foraminal stenosis at L4-L5. Electronically Signed   By: Davina Poke D.O.   On: 05/26/2021 14:21   DG FL GUIDED LUMBAR PUNCTURE  Result Date: 05/26/2021 CLINICAL DATA:  Weakness of both legs EXAM: DIAGNOSTIC LUMBAR PUNCTURE UNDER FLUOROSCOPIC GUIDANCE COMPARISON:  None FLUOROSCOPY TIME:  Fluoroscopy Time:  30 seconds Radiation Exposure Index (if provided by the fluoroscopic device): 11.2 mGy PROCEDURE: Informed consent was obtained from the patient prior to the procedure, including potential complications of headache, allergy, and pain. With the patient prone, the lower back was prepped with Betadine. 1% Lidocaine was used for local anesthesia. Lumbar puncture was initially attempted at the L2-L3 level using a 22 gauge needle with return of minimal serosanguineous fluid. Repeat attempt at L2-L3 level resulted in same outcome. Puncture was then attempted at the L3-L4 level. Suspicion flow could not be obtained despite repositioning at this level. Initial lateral radiograph demonstrated needle positioning within the disc. Needle was retracted and subsequent radiograph demonstrated needle in expected position of spinal canal. Again, there was minimal serosanguineous fluid. Obtained scant CSF was sent for laboratory studies. The patient tolerated the procedure well and there were no apparent complications. IMPRESSION: Unsuccessful fluoroscopic guided lumbar puncture. Only scant serosanguineous CSF was obtained. Electronically Signed   By: Macy Mis M.D.   On: 05/26/2021 12:33   (Echo, Carotid, EGD, Colonoscopy, ERCP)    Subjective: Pt c/o malaise    Discharge Exam: Vitals:   06/01/21 0556 06/01/21 0758  BP: 135/90 134/87  Pulse: 73 78  Resp:  18  Temp: 97.6 F (36.4 C) 98 F (36.7 C)  SpO2: 96% 97%   Vitals:   05/31/21 1941 06/01/21 0053 06/01/21 0556 06/01/21 0758  BP: (!) 141/85 122/76 135/90 134/87  Pulse: 81 81 73 78  Resp: _0 Temp: 98.1  F (36.7 C) 97.9 F (36.6 C) 97.6 F (36.4 C) 98 F (36.7 C)  TempSrc:    Oral  SpO2: 97% 97% 96% 97%  Weight:      Height:        General: Pt is alert, awake, not in acute distress Cardiovascular: S1/S2 +, no rubs, no gallops Respiratory: CTA bilaterally, no wheezing, no rhonchi Abdominal: Soft, NT, obese, bowel sounds + Extremities: no cyanosis    The results of significant diagnostics from this hospitalization (including imaging, microbiology, ancillary and laboratory) are listed below for reference.     Microbiology: Recent Results (from the past 240 hour(s))  Resp Panel by RT-PCR (Flu A&B, Covid) Nasopharyngeal Swab     Status: None   Collection Time: 05/26/21  4:29 AM   Specimen: Nasopharyngeal Swab; Nasopharyngeal(NP) swabs in vial transport medium  Result Value Ref Range Status   SARS Coronavirus 2 by RT PCR NEGATIVE NEGATIVE Final    Comment: (NOTE) SARS-CoV-2 target nucleic acids are NOT DETECTED.  The SARS-CoV-2 RNA is generally detectable in upper respiratory specimens during the acute phase of infection. The lowest concentration of SARS-CoV-2 viral copies this assay can detect is 138 copies/mL. A  negative result does not preclude SARS-Cov-2 infection and should not be used as the sole basis for treatment or other patient management decisions. A negative result may occur with  improper specimen collection/handling, submission of specimen other than nasopharyngeal swab, presence of viral mutation(s) within the areas targeted by this assay, and inadequate number of viral copies(<138 copies/mL). A negative result must be combined with clinical observations, patient history, and epidemiological information. The expected result is Negative.  Fact Sheet for Patients:  EntrepreneurPulse.com.au  Fact Sheet for Healthcare Providers:  IncredibleEmployment.be  This test is no t yet approved or cleared by the [place] FDA and   has been authorized for detection and/or diagnosis of SARS-CoV-2 by FDA under an Emergency Use Authorization (EUA). This EUA will remain  in effect (meaning this test can be used) for the duration of the COVID-19 declaration under Section 564(b)(1) of the Act, 21 U.S.C.section 360bbb-3(b)(1), unless the authorization is terminated  or revoked sooner.       Influenza A by PCR NEGATIVE NEGATIVE Final   Influenza B by PCR NEGATIVE NEGATIVE Final    Comment: (NOTE) The Xpert Xpress SARS-CoV-2/FLU/RSV plus assay is intended as an aid in the diagnosis of influenza from Nasopharyngeal swab specimens and should not be used as a sole basis for treatment. Nasal washings and aspirates are unacceptable for Xpert Xpress SARS-CoV-2/FLU/RSV testing.  Fact Sheet for Patients: EntrepreneurPulse.com.au  Fact Sheet for Healthcare Providers: IncredibleEmployment.be  This test is not yet approved or cleared by the [place] FDA and has been authorized for detection and/or diagnosis of SARS-CoV-2 by FDA under an Emergency Use Authorization (EUA). This EUA will remain in effect (meaning this test can be used) for the duration of the COVID-19 declaration under Section 564(b)(1) of the Act, 21 U.S.C. section 360bbb-3(b)(1), unless the authorization is terminated or revoked.  Performed at [facility], [address] [place], [place] [address]      Labs: BNP (last 3 results) No results for input(s): BNP in the last 8760 hours. Basic Metabolic Panel: Recent Labs  Lab 05/26/21 0033 05/27/21 0646 05/29/21 1124 06/01/21 0525  NA 138 137 138 138  K 3.5 3.7 4.1 3.8  CL 108 106 105 107  CO2 _0 GLUCOSE 170* 227* 200* 81  BUN _1 CREATININE 1.04 0.84 0.91 0.93  CALCIUM 10.2 9.7 10.6* 9.8   Liver Function Tests: No results for input(s): AST, ALT, ALKPHOS, BILITOT, PROT, ALBUMIN in the last 168 hours. No results for input(s):  LIPASE, AMYLASE in the last 168 hours. No results for input(s): AMMONIA in the last 168 hours. CBC: Recent Labs  Lab 05/26/21 0033 05/27/21 0646 06/01/21 0525  WBC 9.6 6.4 8.6  HGB 13.6 13.3 12.7*  HCT 42.2 42.1 39.6  MCV 85.8 85.4 84.4  PLT 328 266 268   Cardiac Enzymes: No results for input(s): CKTOTAL, CKMB, CKMBINDEX, TROPONINI in the last 168 hours. BNP: Invalid input(s): POCBNP CBG: Recent Labs  Lab 05/31/21 0800 05/31/21 1220 05/31/21 1645 05/31/21 1937 06/01/21 0759  GLUCAP 89 154* 87 72 94   D-Dimer No results for input(s): DDIMER in the last 72 hours. Hgb A1c No results for input(s): HGBA1C in the last 72 hours. Lipid Profile No results for input(s): CHOL, HDL, LDLCALC, TRIG, CHOLHDL, LDLDIRECT in the last 72 hours. Thyroid function studies No results for input(s): TSH, T4TOTAL, T3FREE, THYROIDAB in the last 72 hours.  Invalid input(s): FREET3 Anemia work up No results for  input(s): VITAMINB12, FOLATE, FERRITIN, TIBC, IRON, RETICCTPCT in the last 72 hours. Urinalysis    Component Value Date/Time   COLORURINE YELLOW 05/26/2021 0629   APPEARANCEUR CLEAR 05/26/2021 0629   LABSPEC 1.025 05/26/2021 0629   PHURINE 6.0 05/26/2021 0629   GLUCOSEU NEGATIVE 05/26/2021 0629   HGBUR NEGATIVE 05/26/2021 0629   BILIRUBINUR NEGATIVE 05/26/2021 0629   KETONESUR NEGATIVE 05/26/2021 0629   PROTEINUR NEGATIVE 05/26/2021 0629   NITRITE NEGATIVE 05/26/2021 0629   LEUKOCYTESUR NEGATIVE 05/26/2021 0629   Sepsis Labs Invalid input(s): PROCALCITONIN,  WBC,  LACTICIDVEN Microbiology Recent Results (from the past 240 hour(s))  Resp Panel by RT-PCR (Flu A&B, Covid) Nasopharyngeal Swab     Status: None   Collection Time: 05/26/21  4:29 AM   Specimen: Nasopharyngeal Swab; Nasopharyngeal(NP) swabs in vial transport medium  Result Value Ref Range Status   SARS Coronavirus 2 by RT PCR NEGATIVE NEGATIVE Final    Comment: (NOTE) SARS-CoV-2 target nucleic acids are NOT  DETECTED.  The SARS-CoV-2 RNA is generally detectable in upper respiratory specimens during the acute phase of infection. The lowest concentration of SARS-CoV-2 viral copies this assay can detect is 138 copies/mL. A negative result does not preclude SARS-Cov-2 infection and should not be used as the sole basis for treatment or other patient management decisions. A negative result may occur with  improper specimen collection/handling, submission of specimen other than nasopharyngeal swab, presence of viral mutation(s) within the areas targeted by this assay, and inadequate number of viral copies(<138 copies/mL). A negative result must be combined with clinical observations, patient history, and epidemiological information. The expected result is Negative.  Fact Sheet for Patients:  EntrepreneurPulse.com.au  Fact Sheet for Healthcare Providers:  IncredibleEmployment.be  This test is no t yet approved or cleared by the [place] FDA and  has been authorized for detection and/or diagnosis of SARS-CoV-2 by FDA under an Emergency Use Authorization (EUA). This EUA will remain  in effect (meaning this test can be used) for the duration of the COVID-19 declaration under Section 564(b)(1) of the Act, 21 U.S.C.section 360bbb-3(b)(1), unless the authorization is terminated  or revoked sooner.       Influenza A by PCR NEGATIVE NEGATIVE Final   Influenza B by PCR NEGATIVE NEGATIVE Final    Comment: (NOTE) The Xpert Xpress SARS-CoV-2/FLU/RSV plus assay is intended as an aid in the diagnosis of influenza from Nasopharyngeal swab specimens and should not be used as a sole basis for treatment. Nasal washings and aspirates are unacceptable for Xpert Xpress SARS-CoV-2/FLU/RSV testing.  Fact Sheet for Patients: EntrepreneurPulse.com.au  Fact Sheet for Healthcare Providers: IncredibleEmployment.be  This test is not yet  approved or cleared by the [place] FDA and has been authorized for detection and/or diagnosis of SARS-CoV-2 by FDA under an Emergency Use Authorization (EUA). This EUA will remain in effect (meaning this test can be used) for the duration of the COVID-19 declaration under Section 564(b)(1) of the Act, 21 U.S.C. section 360bbb-3(b)(1), unless the authorization is terminated or revoked.  Performed at [facility], [address] [place], [place] [address]      Time coordinating discharge: Over 30 minutes  SIGNED:   Wyvonnia Dusky, MD  [facility] 06/01/2021, 11:19 AM Pager   If 7PM-7AM, please contact night-coverage

## 2021-06-01 NOTE — Progress Notes (Signed)
INPATIENT REHABILITATION ADMISSION NOTE     Arrival Method: Doctor, general practice via EMS      Mental Orientation: Alert and oriented x4     Assessment: Completed     Skin: Completed with Ivin Booty, LPN     IV'S: 10U left hand     Pain: Denies pain     Tubes and Drains: N/A     Safety Measures: Side rails x3 and fall interventions Implemented     Vital Signs: Completed     Height and Weight: 6'7" and 343 lbs     Rehab Orientation: Given     Family: None at bedside       Notes: N/A

## 2021-06-01 NOTE — Progress Notes (Signed)
Izora Ribas, MD  Physician CASE MANAGEMENT PMR Pre-admission     Signed Date of Service:  05/29/2021  6:10 PM  Related encounter: ED to Hosp-Admission (Discharged) from 05/26/2021 in [place] (1C)   Signed                                                                                                                                                                                                                                                                                                                                                                                                                                                                                                         PMR Admission Coordinator Pre-Admission Assessment   Patient: Jerry Wheeler is an 45 y.o., male MRN: [id number] DOB: 1975-08-03 Height: _0  (200.7 cm) Weight: (!) 156.5 kg (standing)   Insurance Information HMO:     PPO:      PCP:      IPA:      80/20: yes     OTHER:  PRIMARY: Medicare A and B  Policy#: [id number]      Subscriber: Pt Phone#: Verified online    Fax#:  Pre-Cert#:       Employer:  Benefits:  Phone #:      Name:  Eff. Date: Parts A ad B effective  04/18/1997 Deduct: $1556      Out of Pocket Max:  None      Life Max: N/A  CIR: 100%      SNF: 100 days Outpatient: 80%     Co-Pay: 20% Home Health: 100%      Co-Pay: none DME: 80%     Co-Pay: 20% Providers: patient's choice SECONDARY: none      Policy#:      Phone#:    Development worker, community:       Phone#:    [employer] for patients in Inpatient Rehabilitation Facilities with attached Privacy Act [place] Records was provided and verbally reviewed with: Patient   Emergency Contact  Information Contact Information       Name Relation Home Work Mobile    Contact,No   [phone]        Dais,andrew Brother     [phone]    Hodgkiss,robert Father     [phone]           Current Medical History  Patient Admitting Diagnosis: Lumbar Myelopathy   History of Present Illness: Jerry Wheeler is a 45 y.o. male with medical history significant for morbid obesity, diabetes mellitus, bipolar disorder and hypertension who presented to the emergency room 05/26/21 for evaluation of weakness and inability to ambulate.  Patient states that he has peripheral neuropathy related to his underlying diabetes mellitus last 3 weeks he has had progressive lower extremity and 1 day prior to his admission he fell and was able to stand up.  He states that his balance is off and he is able to ambulate without holding onto something.  He denies having any back pain and denies having any urinary or fecal incontinence.  He denies having any fever, no chills, no cough or congestion.  He denies having any recent viral infection.On admission, Sodium 138, potassium 3.5, chloride 108, bicarb 23, glucose 170, BUN 17, creatinine 1.04, calcium 10.2, troponin 37, white count 9.6, hemoglobin 13.6, hematocrit 42.2, MCV 85.8, RDW 13.5, platelet count 328, lithium level 0.52. Respiratory viral panel is negative Twelve-lead EKG showed normal sinus rhythm with LVH. MRI of the lumbar spine revealed Multilevel lumbar spondylosis, as detailed above, severe canal stenosis at L3-L4, moderate canal stenosis at L2-L3, and  mild-to-moderate canal stenosis with moderate-severe left foraminal stenosis at L4-L5. Neurosurgery recommended outpatient management, Pt. Declined elective decompression. PT and OT worked with Pt . And recommended CIR to assist return to PLOF.    Patient's medical record from [facility] has been reviewed by the rehabilitation admission coordinator and physician.   Past Medical  History  History reviewed. No pertinent past medical history.   Has the patient had major surgery during 100 days prior to admission? No   Family History   family history includes Hypertension in his mother.   Current Medications   Current Facility-Administered Medications:    0.9 %  sodium chloride infusion, 250 mL, Intravenous, PRN, Agbata, Tochukwu, MD   acetaminophen (TYLENOL) tablet 650 mg, 650 mg, Oral, Q6H PRN, 650 mg at 05/29/21 0908 **OR** acetaminophen (TYLENOL) suppository 650 mg, 650 mg, Rectal, Q6H PRN, Agbata, Tochukwu, MD   amLODipine (NORVASC) tablet 5 mg, 5 mg, Oral, Daily, Agbata, Tochukwu,  MD, 5 mg at 06/01/21 0856   atorvastatin (LIPITOR) tablet 40 mg, 40 mg, Oral, Daily, Agbata, Tochukwu, MD, 40 mg at 05/31/21 2024   cariprazine (VRAYLAR) capsule 3 mg, 3 mg, Oral, Daily, Jennye Boroughs, MD, 3 mg at 06/01/21 0856   fenofibrate tablet 160 mg, 160 mg, Oral, Daily, Agbata, Tochukwu, MD, 160 mg at 05/31/21 2024   ibuprofen (ADVIL) tablet 600 mg, 600 mg, Oral, Q8H PRN, Jennye Boroughs, MD, 600 mg at 05/29/21 1218   insulin regular human CONCENTRATED (HUMULIN R) 500 UNIT/ML KwikPen 85 Units, 85 Units, Subcutaneous, TID WC, Jennye Boroughs, MD, 85 Units at 06/01/21 0857   lamoTRIgine (LAMICTAL) tablet 150 mg, 150 mg, Oral, BID, Agbata, Tochukwu, MD, 150 mg at 06/01/21 0856   lithium carbonate (LITHOBID) CR tablet 300 mg, 300 mg, Oral, BID, Agbata, Tochukwu, MD, 300 mg at 06/01/21 0857   ondansetron (ZOFRAN) tablet 4 mg, 4 mg, Oral, Q6H PRN **OR** ondansetron (ZOFRAN) injection 4 mg, 4 mg, Intravenous, Q6H PRN, Agbata, Tochukwu, MD, 4 mg at 05/29/21 1018   sodium chloride flush (NS) 0.9 % injection 3 mL, 3 mL, Intravenous, Q12H, Agbata, Tochukwu, MD, 3 mL at 06/01/21 0903   sodium chloride flush (NS) 0.9 % injection 3 mL, 3 mL, Intravenous, PRN, Agbata, Tochukwu, MD   Patients Current Diet:  Diet Order                  Diet heart healthy/carb modified Room service appropriate?  Yes; Fluid consistency: Thin  Diet effective now                         Precautions / Restrictions Precautions Precautions: Fall Restrictions Weight Bearing Restrictions: No    Has the patient had 2 or more falls or a fall with injury in the past year? Yes   Prior Activity Level Community (5-7x/wk): Pt. was active in the community PTA   Prior Functional Level Self Care: Did the patient need help bathing, dressing, using the toilet or eating? Independent   Indoor Mobility: Did the patient need assistance with walking from room to room (with or without device)? Independent   Stairs: Did the patient need assistance with internal or external stairs (with or without device)? Independent   Functional Cognition: Did the patient need help planning regular tasks such as shopping or remembering to take medications? Independent   Patient Information Are you of Hispanic, Latino/a,or Spanish origin?: A. No, not of Hispanic, Latino/a, or Spanish origin What is your race?: A. White Do you need or want an interpreter to communicate with a doctor or health care staff?: 0. No   Patient's Response To:  Health Literacy and Transportation Is the patient able to respond to health literacy and transportation needs?: Yes Health Literacy - How often do you need to have someone help you when you read instructions, pamphlets, or other written material from your doctor or pharmacy?: Never In the past 12 months, has lack of transportation kept you from medical appointments or from getting medications?: No In the past 12 months, has lack of transportation kept you from meetings, work, or from getting things needed for daily living?: No   Home Assistive Devices / Equipment Home Equipment: None   Prior Device Use: Indicate devices/aids used by the patient prior to current illness, exacerbation or injury? None of the above   Current Functional Level Cognition   Overall Cognitive Status: Within  Functional Limits for tasks assessed Orientation Level: Oriented  X4 General Comments: Pt is A and O x 4.Cooperative and pleasant. Motivated to get better and return to PLOF    Extremity Assessment (includes Sensation/Coordination)   Upper Extremity Assessment: Generalized weakness  Lower Extremity Assessment: Defer to PT evaluation     ADLs   Overall ADL's : Needs assistance/impaired Eating/Feeding: Modified independent Grooming: Wash/dry hands, Wash/dry face, Minimal assistance, Standing Grooming Details (indicate cue type and reason): MIN A for steadying during grooming tasks requiring b/l UE. MIN GUARD with single UE supported Upper Body Bathing: Modified independent Lower Body Bathing Details (indicate cue type and reason): Pt can demonstrate from seated position but would be unsafe currently to shower in standing to manage LE bathing Upper Body Dressing : Modified independent Lower Body Dressing: Min guard, Sitting/lateral leans Lower Body Dressing Details (indicate cue type and reason): To grab shoes from floor and don/doff socks & shoes while seated EOB Toilet Transfer: Min guard, Rolling walker (2 wheels) Toileting- Clothing Manipulation and Hygiene: Min guard, Minimal assistance Functional mobility during ADLs: Min guard, Rolling walker (2 wheels)     Mobility   Overal bed mobility: Modified Independent General bed mobility comments: heavy use of bed rails and head of bed was slightly elevated     Transfers   Overall transfer level: Needs assistance Equipment used: Rolling walker (2 wheels) Transfers: Sit to/from Stand Sit to Stand: Min guard General transfer comment: CGA for safety     Ambulation / Gait / Stairs / Emergency planning/management officer   Ambulation/Gait Ambulation/Gait assistance: Min guard, Herbalist (Feet): 50 Feet Assistive device: 1 person hand held assist Gait Pattern/deviations: Staggering left, Staggering right, Drifts right/left General Gait  Details: Pt was able to ambulate 50 ft without AD. did require +1 HHA at times to prevent LOB/unsteadiness Gait velocity: decreased     Posture / Balance Dynamic Sitting Balance Sitting balance - Comments: Good sitting balance reaching outside BOS to grab shoes from floor Balance Overall balance assessment: Needs assistance Sitting-balance support: No upper extremity supported, Feet supported Sitting balance-Leahy Scale: Normal Sitting balance - Comments: Good sitting balance reaching outside BOS to grab shoes from floor Standing balance support: Bilateral upper extremity supported, Reliant on assistive device for balance, During functional activity Standing balance-Leahy Scale: Fair Standing balance comment: Requires MIN GUARD for functional mobility with RW. Requires MIN A for tasks requiring dynamic balance with no UE supported     Special needs/care consideration Skin abrasions and ecchymosis on BLEs    Previous Home Environment (from acute therapy documentation) Living Arrangements: Alone  Lives With: Alone Available Help at Discharge: Family Type of Home: Apartment Home Layout: Two level, Bed/bath upstairs Alternate Level Stairs-Rails: Right Alternate Level Stairs-Number of Steps: 14 Bathroom Shower/Tub: Tub/shower unit, Architectural technologist: Standard   Discharge Living Setting Plans for Discharge Living Setting: House Type of Home at Discharge: House Discharge Home Layout: Able to live on main level with bedroom/bathroom, One level Discharge Home Access: Stairs to enter Entrance Stairs-Rails: Right, Left, Can reach both Entrance Stairs-Number of Steps: 3-4 Discharge Bathroom Shower/Tub: Walk-in shower Discharge Bathroom Toilet: Standard Discharge Bathroom Accessibility: Yes How Accessible: Accessible via walker   Social/Family/Support Systems Patient Roles: Other (Comment) Contact Information: [phone] Anticipated Caregiver: Mitzi Hansen  (brother) Ability/Limitations of Caregiver: min A Caregiver Availability: 24/7 Discharge Plan Discussed with Primary Caregiver: Yes Is Caregiver In Agreement with Plan?: Yes Does Caregiver/Family have Issues with Lodging/Transportation while Pt is in Rehab?: No   Goals Patient/Family Goal for Rehab: PT/OT Mod I  Expected length of stay: 5-7 days Pt/Family Agrees to Admission and willing to participate: Yes Program Orientation Provided & Reviewed with Pt/Caregiver Including Roles  & Responsibilities: Yes   Decrease burden of Care through IP rehab admission: Specialzed equipment needs, Decrease number of caregivers, and Patient/family education   Possible need for SNF placement upon discharge: not anticipated           Patient Condition: I have reviewed medical records from [facility], spoken with CM, and patient and family member. I met with patient at the bedside for inpatient rehabilitation assessment.  Patient will benefit from ongoing PT and OT, can actively participate in 3 hours of therapy a day 5 days of the week, and can make measurable gains during the admission.  Patient will also benefit from the coordinated team approach during an Inpatient Acute Rehabilitation admission.  The patient will receive intensive therapy as well as Rehabilitation physician, nursing, social worker, and care management interventions.  Due to safety, disease management, medication administration, pain management, and patient education the patient requires 24 hour a day rehabilitation nursing.  The patient is currently min A-min Guard  with mobility and basic ADLs.  Discharge setting and therapy post discharge at home with home health is anticipated.  Patient has agreed to participate in the Acute Inpatient Rehabilitation Program and will admit today.   Preadmission Screen Completed By:  Retta Diones, 06/01/2021 10:18 AM ______________________________________________________________________    Discussed status with Dr. Ranell Patrick on 06/01/21 at 10:02 and received approval for admission today.   Admission Coordinator:  Retta Diones, RN, time 10:03/Date 06/01/21    Assessment/Plan: Diagnosis: Spinal stenosis and severe neuropathy Does the need for close, 24 hr/day Medical supervision in concert with the patient's rehab needs make it unreasonable for this patient to be served in a less intensive setting? Yes Co-Morbidities requiring supervision/potential complications: HTN, bipolar disorder, nausea, vomiting, unsteady gait, lower extremity weakness Due to bladder management, bowel management, safety, skin/wound care, disease management, medication administration, pain management, and patient education, does the patient require 24 hr/day rehab nursing? Yes Does the patient require coordinated care of a physician, rehab nurse, PT, OT to address physical and functional deficits in the context of the above medical diagnosis(es)? Yes Addressing deficits in the following areas: balance, endurance, locomotion, strength, transferring, bowel/bladder control, bathing, dressing, feeding, grooming, toileting, and psychosocial support Can the patient actively participate in an intensive therapy program of at least 3 hrs of therapy 5 days a week? Yes The potential for patient to make measurable gains while on inpatient rehab is excellent Anticipated functional outcomes upon discharge from inpatient rehab: modified independent PT, modified independent OT, I SLP Estimated rehab length of stay to reach the above functional goals is: 10-14 days Anticipated discharge destination: Home 10. Overall Rehab/Functional Prognosis: excellent     MD Signature: Leeroy Cha, MD         Revision History                                              Note Details  Author Izora Ribas, MD File Time 06/01/2021 10:57 AM  Author Type Physician Status Signed  Last Editor Izora Ribas, [place] # [id number] [place] Date 06/01/2021

## 2021-06-01 NOTE — Progress Notes (Signed)
Per Gwendolyn Lima RN at [facility], IV can remain in for transfer.

## 2021-06-02 ENCOUNTER — Inpatient Hospital Stay (HOSPITAL_COMMUNITY): Payer: Medicare Other

## 2021-06-02 DIAGNOSIS — E1042 Type 1 diabetes mellitus with diabetic polyneuropathy: Secondary | ICD-10-CM

## 2021-06-02 DIAGNOSIS — K3184 Gastroparesis: Secondary | ICD-10-CM

## 2021-06-02 DIAGNOSIS — F319 Bipolar disorder, unspecified: Secondary | ICD-10-CM

## 2021-06-02 DIAGNOSIS — E1143 Type 2 diabetes mellitus with diabetic autonomic (poly)neuropathy: Secondary | ICD-10-CM

## 2021-06-02 DIAGNOSIS — E1065 Type 1 diabetes mellitus with hyperglycemia: Secondary | ICD-10-CM

## 2021-06-02 LAB — COMPREHENSIVE METABOLIC PANEL
ALT: 26 U/L (ref 0–44)
AST: 21 U/L (ref 15–41)
Albumin: 3.4 g/dL — ABNORMAL LOW (ref 3.5–5.0)
Alkaline Phosphatase: 39 U/L (ref 38–126)
Anion gap: 7 (ref 5–15)
BUN: 13 mg/dL (ref 6–20)
CO2: 23 mmol/L (ref 22–32)
Calcium: 10.1 mg/dL (ref 8.9–10.3)
Chloride: 107 mmol/L (ref 98–111)
Creatinine, Ser: 1.08 mg/dL (ref 0.61–1.24)
GFR, Estimated: >60 mL/min (ref >60)
Glucose, Bld: 74 mg/dL (ref 70–99)
Potassium: 4.1 mmol/L (ref 3.5–5.1)
Sodium: 137 mmol/L (ref 135–145)
Total Bilirubin: 0.5 mg/dL (ref 0.3–1.2)
Total Protein: 6.5 g/dL (ref 6.5–8.1)

## 2021-06-02 LAB — CBC WITH DIFFERENTIAL/PLATELET
Abs Immature Granulocytes: 0.08 10*3/uL — ABNORMAL HIGH (ref 0.00–0.07)
Basophils Absolute: 0.1 10*3/uL (ref 0.0–0.1)
Basophils Relative: 1 %
Eosinophils Absolute: 0.2 10*3/uL (ref 0.0–0.5)
Eosinophils Relative: 3 %
HCT: 43.1 % (ref 39.0–52.0)
Hemoglobin: 13.5 g/dL (ref 13.0–17.0)
Immature Granulocytes: 1 %
Lymphocytes Relative: 24 %
Lymphs Abs: 2.3 10*3/uL (ref 0.7–4.0)
MCH: 27 pg (ref 26.0–34.0)
MCHC: 31.3 g/dL (ref 30.0–36.0)
MCV: 86.2 fL (ref 80.0–100.0)
Monocytes Absolute: 0.9 10*3/uL (ref 0.1–1.0)
Monocytes Relative: 10 %
Neutro Abs: 5.9 10*3/uL (ref 1.7–7.7)
Neutrophils Relative %: 61 %
Platelets: 268 10*3/uL (ref 150–400)
RBC: 5 MIL/uL (ref 4.22–5.81)
RDW: 13.2 % (ref 11.5–15.5)
WBC: 9.6 10*3/uL (ref 4.0–10.5)
nRBC: 0 % (ref 0.0–0.2)

## 2021-06-02 LAB — GLUCOSE, CAPILLARY
Glucose-Capillary: 77 mg/dL (ref 70–99)
Glucose-Capillary: 195 mg/dL — ABNORMAL HIGH (ref 70–99)
Glucose-Capillary: 96 mg/dL (ref 70–99)
Glucose-Capillary: 63 mg/dL — ABNORMAL LOW (ref 70–99)
Glucose-Capillary: 73 mg/dL (ref 70–99)

## 2021-06-02 IMAGING — DX DG ABDOMEN 1V
5 series · 5 of 5 positions shown · non-contrast
Comparison: None.

CLINICAL DATA: Obstipation.

EXAM:
ABDOMEN - 1 VIEW

[abdomen kub (1 of 5)]
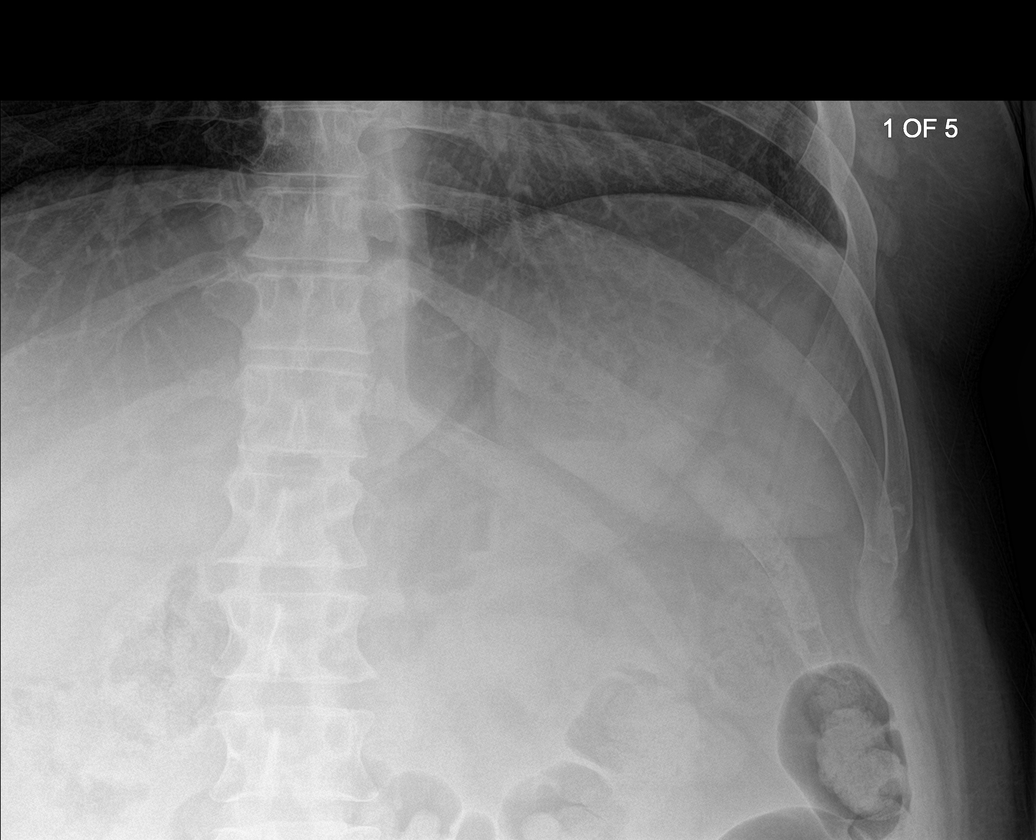

[abdomen kub (2 of 5)]
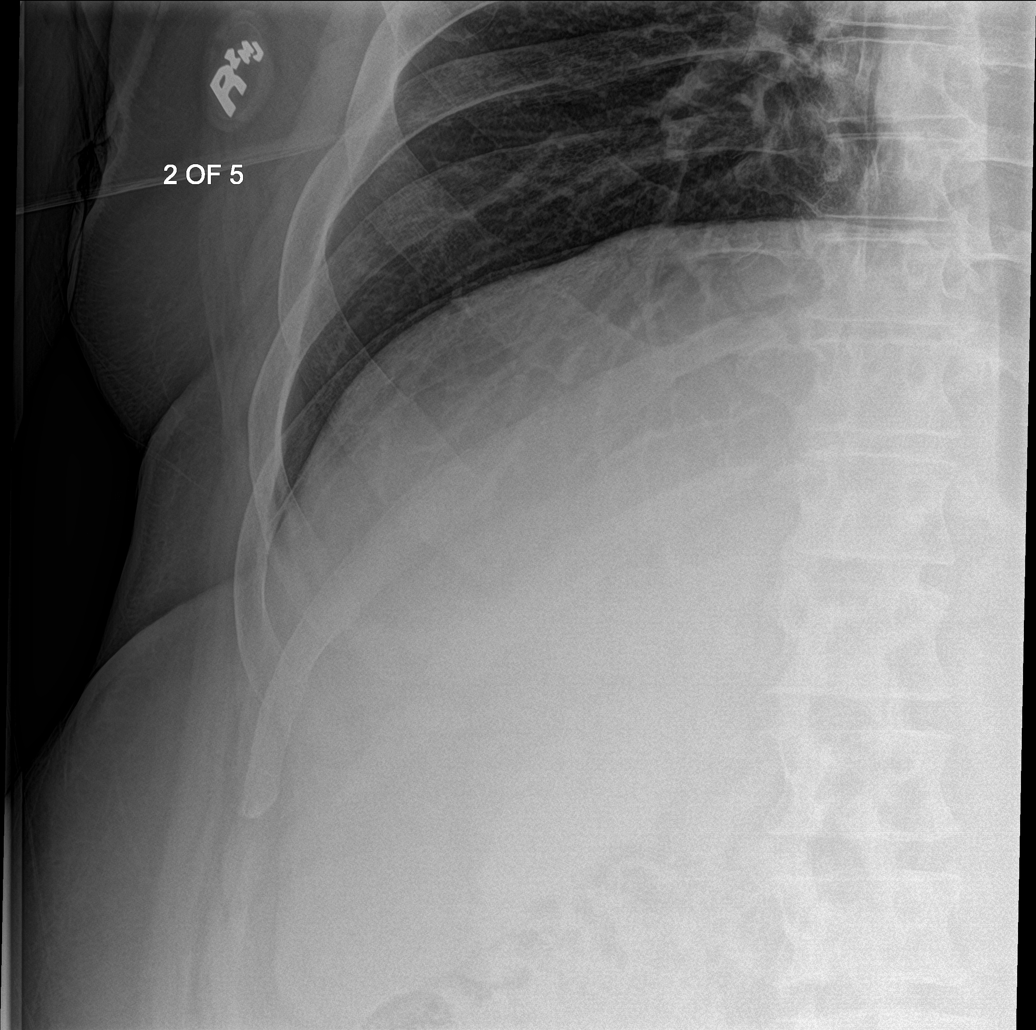

[abdomen kub (3 of 5)]
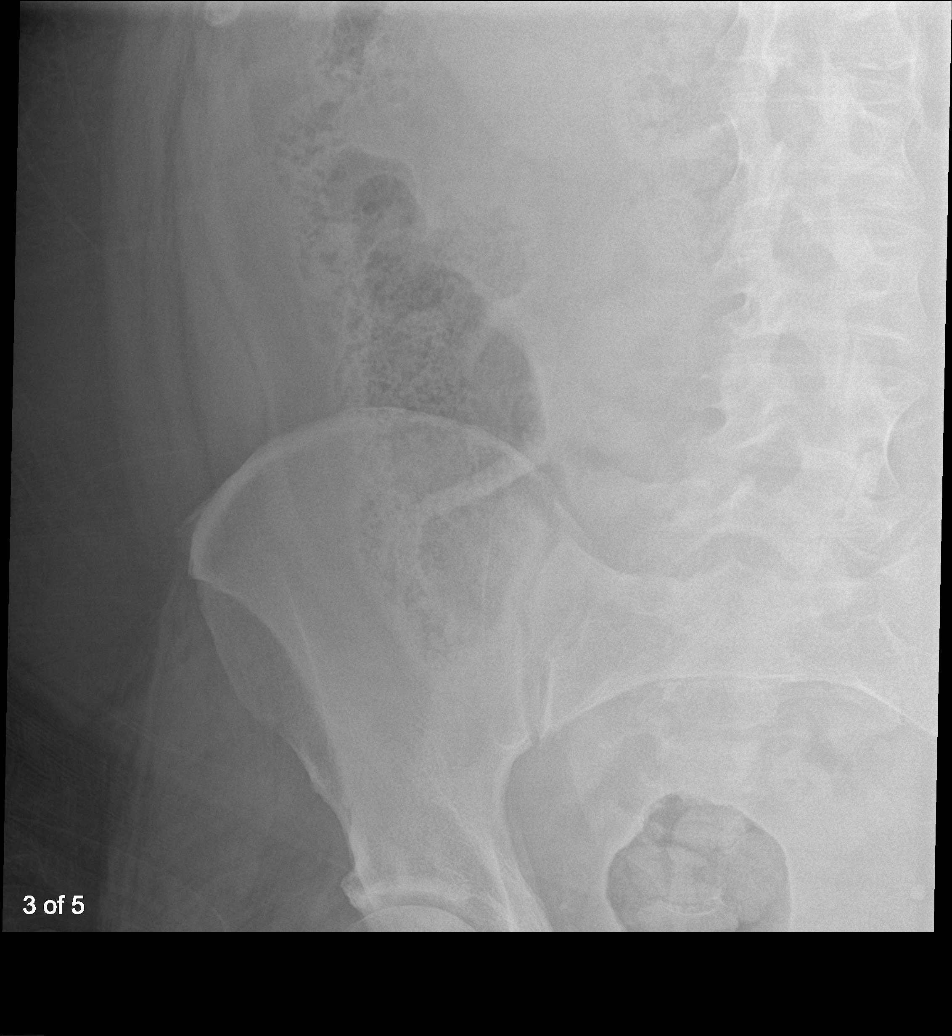

[abdomen kub (4 of 5)]
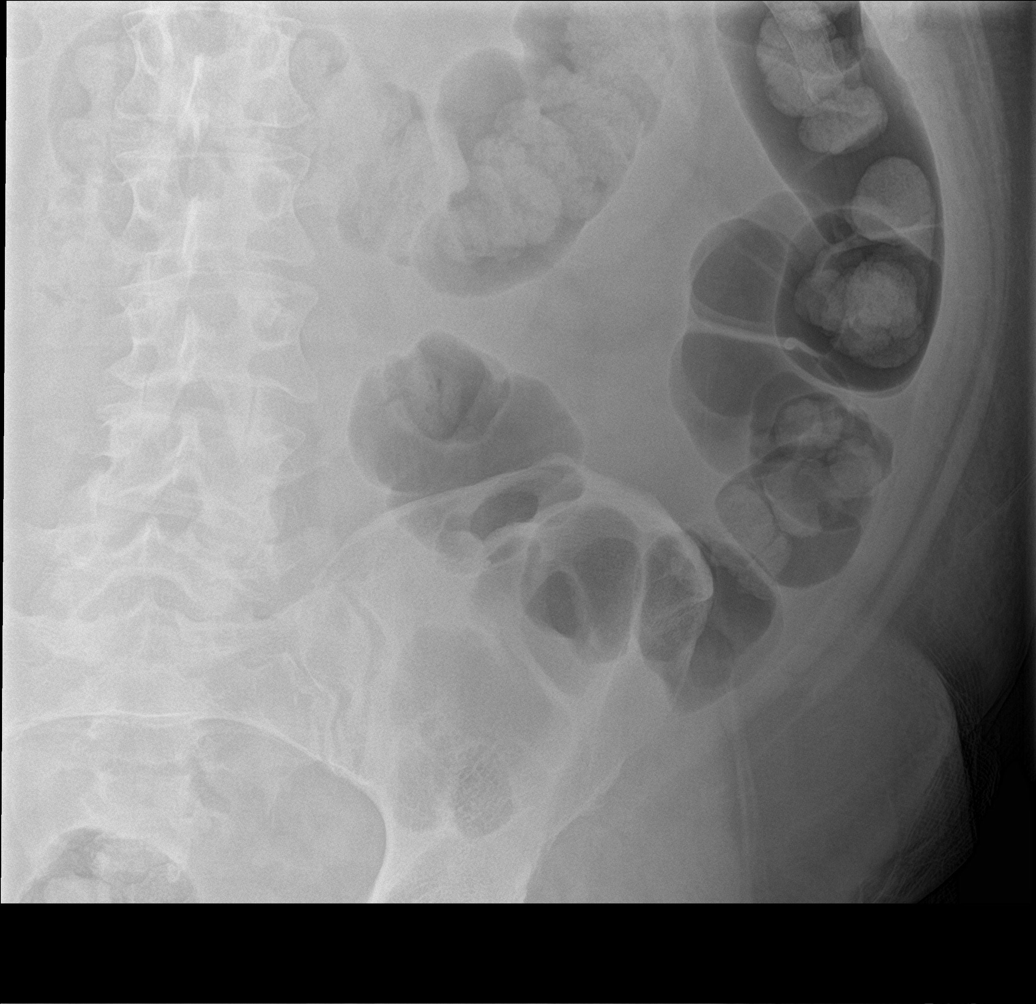

[abdomen kub (5 of 5)]
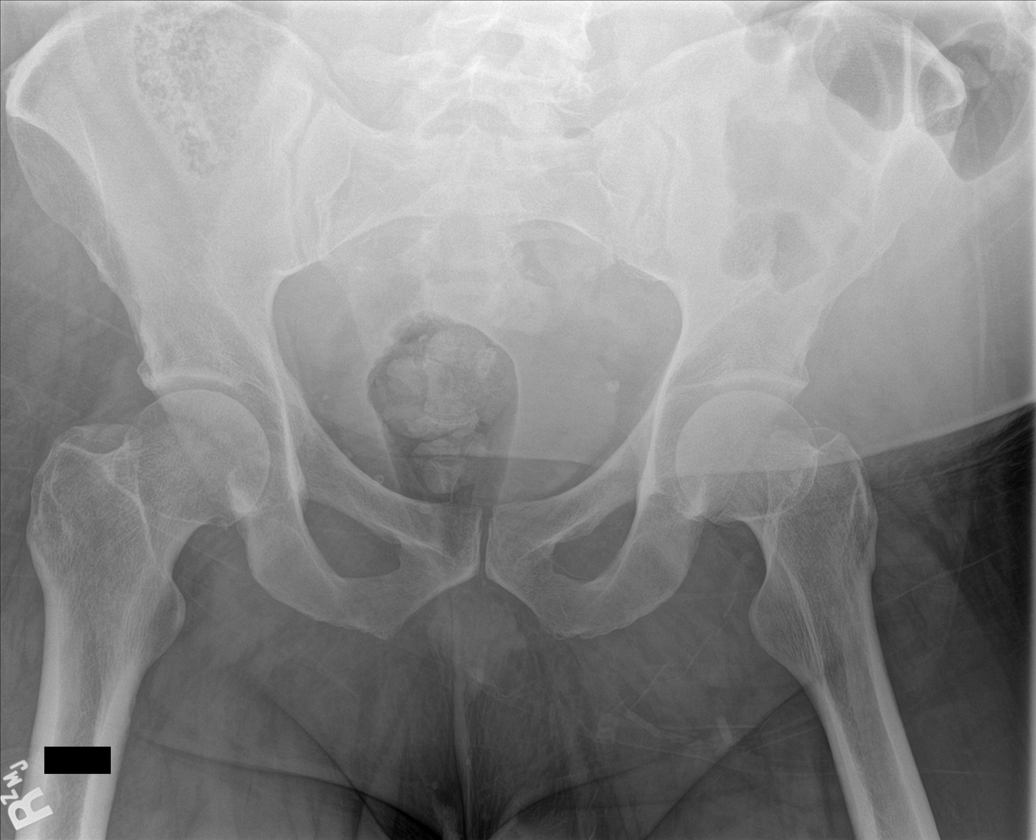

[5 of 5 positions shown; findings below may reference images not displayed]

FINDINGS: Nonobstructive bowel gas pattern. Mild-to-moderate amount of stool
in the colon. Stool and gas in the rectum. No small bowel
dilatation.
IMPRESSION: Normal bowel gas pattern. Mild-to-moderate retained stool in the
colon.

## 2021-06-02 MED ORDER — ENOXAPARIN SODIUM 80 MG/0.8ML IJ SOSY
75.0000 mg | PREFILLED_SYRINGE | INTRAMUSCULAR | Status: DC
  Administered 2021-06-02 – 2021-06-08 (×7): 75 mg via SUBCUTANEOUS
  Filled 2021-06-02 (×8): qty 0.75

## 2021-06-02 MED ORDER — SORBITOL 70 % SOLN
30.0000 mL | Freq: Every day | Status: DC | PRN
  Administered 2021-06-03: 30 mL via ORAL
  Filled 2021-06-02: qty 30

## 2021-06-02 MED ORDER — SENNOSIDES-DOCUSATE SODIUM 8.6-50 MG PO TABS
2.0000 | ORAL_TABLET | Freq: Every day | ORAL | Status: DC
  Administered 2021-06-02: 2 via ORAL
  Filled 2021-06-02: qty 2

## 2021-06-02 NOTE — Evaluation (Signed)
Physical Therapy Assessment and Plan  Patient Details  Name: Jerry Wheeler MRN: [id number] Date of Birth: 1975-12-25  PT Diagnosis: Abnormality of gait, Coordination disorder, and Muscle weakness Rehab Potential: Excellent ELOS: 7 - 10 days   Today's Date: 06/02/2021 PT Individual Time: 1105-1200 PT Individual Time Calculation (min): 55 min    Hospital Problem: Principal Problem:   Lumbar disc disorder with myelopathy   Past Medical History:  Past Medical History:  Diagnosis Date   Anxiety disorder    Bipolar disorder ([place])    Diabetes mellitus ([place])    Diabetic peripheral neuropathy associated with type 2 diabetes mellitus ([place])    Emphysema lung ([place])    hospitalization 1999   High blood pressure    Major depressive disorder in partial remission ([place])    Morbid obesity ([place])    Osteomyelitis of great toe of left foot ([place])    Sleep apnea    Tobacco use disorder, continuous    Ulcer of right heel ([place])    followed by podiatry--fully healed 09/2020   Past Surgical History:  Past Surgical History:  Procedure Laterality Date   DG GREAT TOE LEFT FOOT  2010   s/p I and D for osteo    Assessment & Plan Clinical Impression: Patient is a 45 year old male with history of poorly controlled T2DM with peripheral neuropathy, bipolar disorder, OSA who was admitted to [facility] on 05/26/21 with worsening of BLE weakness and  numbness for 3 weeks progressing to inability to walk x one day. Neurology consulted and MRI spine done revealing  severe canal stenosis L3/L4, moderate canal stenosis L2/L3 and mild to moderate canal stenosis with  moderate to severe left foraminal stenosis at L4/L5. LP done was bloody and revealing elevated protein-287, WBC 12 and glucose 122.  Protein elevated due to 4000k red cells and no pleocytosis with hyporeflexia felt to be due to diabetes and NS evaluation recommended by Dr. Quinn Axe.  Dr. Lacinda Axon consulted for input and as no compression signs noted, recommended  aggressive management of DM and outpatient follow up for input on treatment as patient also not interested in surgery at this time. Therapy initiated and  patient continues to be limited by weakness with sensory deficits, balance deficits and fatigue affecting ADLs and mobility. CIR recommended due to functional decline.   .  Patient transferred to CIR on 06/01/2021 .   Patient currently requires min with mobility secondary to muscle weakness and muscle joint tightness, decreased cardiorespiratoy endurance, decreased coordination, and decreased standing balance, decreased postural control, and decreased balance strategies.  Prior to hospitalization, patient was independent  with mobility and lived with Alone in a House home.  Home access is 4Stairs to enter.  Patient will benefit from skilled PT intervention to maximize safe functional mobility, minimize fall risk, and decrease caregiver burden for planned discharge home with 24 hour supervision.  Anticipate patient will benefit from follow up [place] at discharge.  PT - End of Session Activity Tolerance: Tolerates 10 - 20 min activity with multiple rests Endurance Deficit: Yes PT Assessment Rehab Potential (ACUTE/IP ONLY): Excellent PT Barriers to Discharge: Inaccessible home environment;Decreased caregiver support;Home environment access/layout PT Patient demonstrates impairments in the following area(s): Balance;Endurance;Motor;Sensory PT Transfers Functional Problem(s): Bed Mobility;Bed to Chair;Car;Furniture;Floor PT Locomotion Functional Problem(s): Ambulation;Wheelchair Mobility;Stairs PT Plan PT Intensity: Minimum of 1-2 x/day ,45 to 90 minutes PT Frequency: 5 out of 7 days PT Duration Estimated Length of Stay: 7 - 10 days PT Treatment/Interventions: Ambulation/gait training;Community reintegration;DME/adaptive equipment  instruction;Neuromuscular re-education;Psychosocial support;Stair training;UE/LE Strength taining/ROM;Wheelchair  propulsion/positioning;UE/LE Coordination activities;Therapeutic Activities;Skin care/wound management;Pain management;Functional electrical stimulation;Discharge planning;Balance/vestibular training;Cognitive remediation/compensation;Disease management/prevention;Functional mobility training;Patient/family education;Splinting/orthotics;Therapeutic Exercise;Visual/perceptual remediation/compensation PT Transfers Anticipated Outcome(s): Mod I with LRAD PT Locomotion Anticipated Outcome(s): ambulatory at mod I level with LRAD PT Recommendation Recommendations for Other Services: Therapeutic Recreation consult Therapeutic Recreation Interventions: Stress management;Outing/community reintergration Follow Up Recommendations: Outpatient PT Patient destination: Home Equipment Recommended: To be determined;Rolling walker with 5" wheels   PT Evaluation Precautions/Restrictions Precautions Precautions: Fall Restrictions Weight Bearing Restrictions: No General   Vital Signs Pain Pain Assessment Pain Scale: 0-10 Pain Score: 0-No pain Pain Interference Pain Interference Pain Effect on Sleep: 1. Rarely or not at all Pain Interference with Therapy Activities: 1. Rarely or not at all Pain Interference with Day-to-Day Activities: 1. Rarely or not at all Home Living/Prior [place] Available Help at Discharge: Family Type of Home: House Home Access: Stairs to enter [employer] of Steps: 4 Entrance Stairs-Rails: Left;Right Home Layout: Two level;Able to live on main level with bedroom/bathroom Alternate Level Stairs-Number of Steps: 14 Alternate Level Stairs-Rails: Right;Left Bathroom Shower/Tub: Tub/shower unit;Curtain Bathroom Toilet: Standard Bathroom Accessibility: Yes  Lives With: Alone Prior Function Level of Independence: Independent with basic ADLs;Independent with homemaking with ambulation  Able to Take Stairs?: Yes Driving: Yes Leisure: Hobbies-yes  (Comment) (vidoe editing) Vision/Perception  Vision - History Ability to See in Adequate Light: 0 Adequate Perception Perception: Within Functional Limits Praxis Praxis: Intact  Cognition Overall Cognitive Status: Within Functional Limits for tasks assessed Arousal/Alertness: Awake/alert Year: 2022 Month: December Day of Week: Correct Memory: Appears intact Immediate Memory Recall: Sock;Blue;Bed Memory Recall Sock: Without Cue Memory Recall Blue: Without Cue Memory Recall Bed: Without Cue Awareness: Appears intact Problem Solving: Appears intact Safety/Judgment: Appears intact Sensation Sensation Light Touch: Impaired Detail Peripheral sensation comments: baseline peripheral neuropathy Light Touch Impaired Details: Impaired LLE;Impaired RLE Hot/Cold: Appears Intact (In UE) Proprioception: Impaired by gross assessment Coordination Gross Motor Movements are Fluid and Coordinated: No Fine Motor Movements are Fluid and Coordinated: No Coordination and Movement Description: mild dysmetria due to sensory deficits. mild baseline tremor Finger Nose Finger Test: Impaired 2/2 chronic BUE tremors. Heel Shin Test: grossly [facility] with eyes open Motor  Motor Motor: Other (comment) Motor - Skilled Clinical Observations: mild tremor   Trunk/Postural Assessment  Cervical Assessment Cervical Assessment: Within Functional Limits Thoracic Assessment Thoracic Assessment: Exceptions to [facility] (Large body habitus) Lumbar Assessment Lumbar Assessment: Within Functional Limits Postural Control Postural Control: Deficits on evaluation Trunk Control: Delayed  Balance Balance Balance Assessed: Yes Static Sitting Balance Static Sitting - Balance Support: No upper extremity supported Static Sitting - Level of Assistance: 7: Independent Dynamic Sitting Balance Dynamic Sitting - Balance Support: No upper extremity supported Dynamic Sitting - Level of Assistance: 5: Stand by assistance Sitting  balance - Comments: Demonstrates good sitting balance during seated ADLs Static Standing Balance Static Standing - Balance Support: Bilateral upper extremity supported Static Standing - Level of Assistance: 4: Min assist;5: Stand by assistance Dynamic Standing Balance Dynamic Standing - Balance Support: Bilateral upper extremity supported;During functional activity Dynamic Standing - Level of Assistance: 4: Min assist Dynamic Standing - Comments: During functional tasks Extremity Assessment  RUE Assessment RUE Assessment: Within Functional Limits Passive Range of Motion (PROM) Comments: WFL Active Range of Motion (AROM) Comments: WFL General Strength Comments: MMT grossly 5/5 LUE Assessment LUE Assessment: Within Functional Limits Passive Range of Motion (PROM) Comments: WFL Active Range of Motion (AROM) Comments: WFL General Strength Comments: MMT  grossly 5/5 RLE Assessment RLE Assessment: Exceptions to [facility] General Strength Comments: grossly 4+/5 proximal to distal except ankle DF 3+/5 LLE Assessment LLE Assessment: Exceptions to [facility] General Strength Comments: grossly 4+/5 except aknle DF 2/5  Care Tool Care Tool Bed Mobility Roll left and right activity   Roll left and right assist level: Supervision/Verbal cueing    Sit to lying activity   Sit to lying assist level: Supervision/Verbal cueing    Lying to sitting on side of bed activity   Lying to sitting on side of bed assist level: the ability to move from lying on the back to sitting on the side of the bed with no back support.: Supervision/Verbal cueing     Care Tool Transfers Sit to stand transfer   Sit to stand assist level: Minimal Assistance - Patient > 75%    Chair/bed transfer   Chair/bed transfer assist level: Minimal Assistance - Patient > 75%     Toilet transfer   Assist Level: Minimal Assistance - Patient > 75%    Car transfer Car transfer activity did not occur: Environmental limitations        Care  Tool Locomotion Ambulation   Assist level: Minimal Assistance - Patient > 75% Assistive device: Hand held assist Max distance: 25  Walk 10 feet activity   Assist level: Minimal Assistance - Patient > 75% Assistive device: Hand held assist   Walk 50 feet with 2 turns activity Walk 50 feet with 2 turns activity did not occur: Safety/medical concerns      Walk 150 feet activity Walk 150 feet activity did not occur: Safety/medical concerns      Walk 10 feet on uneven surfaces activity Walk 10 feet on uneven surfaces activity did not occur: Safety/medical concerns      Stairs Stair activity did not occur: Safety/medical concerns        Walk up/down 1 step activity Walk up/down 1 step or curb (drop down) activity did not occur: Safety/medical concerns      Walk up/down 4 steps activity Walk up/down 4 steps activity did not occur: Safety/medical concerns      Walk up/down 12 steps activity Walk up/down 12 steps activity did not occur: Safety/medical concerns      Pick up small objects from floor Pick up small object from the floor (from standing position) activity did not occur: Safety/medical concerns      Wheelchair   Type of Wheelchair: Manual   Wheelchair assist level: Minimal Assistance - Patient > 75% Max wheelchair distance: 150  Wheel 50 feet with 2 turns activity   Assist Level: Minimal Assistance - Patient > 75%  Wheel 150 feet activity   Assist Level: Minimal Assistance - Patient > 75%    Refer to Care Plan for Long Term Goals  SHORT TERM GOAL WEEK 1 PT Short Term Goal 1 (Week 1): STG=LTG due to ELOS  Recommendations for other services: Therapeutic Recreation  Stress management and Outing/community reintegration  Skilled Therapeutic Intervention Mobility Bed Mobility Bed Mobility: Supine to Sit;Sitting - Scoot to Edge of Bed Supine to Sit: Supervision/Verbal cueing Sitting - Scoot to [employer] of Bed: Supervision/Verbal cueing Transfers Transfers: Sit to  Stand;Stand to Sit;Stand Pivot Transfers Sit to Stand: Minimal Assistance - Patient > 75% Stand to Sit: Minimal Assistance - Patient > 75% Stand Pivot Transfers: Minimal Assistance - Patient > 75% Transfer (Assistive device): Other (Comment) (HHA) Locomotion  Gait Ambulation: Yes Gait Assistance: Minimal Assistance - Patient > 75% Gait Distance (  Feet): 150 Feet Assistive device: Rolling walker Gait Gait: Yes Gait Pattern: Impaired Gait Pattern: Lateral hip instability Stairs / Additional Locomotion Stairs: Yes Stair Management Technique: Two rails Number of Stairs: 8 Height of Stairs: 6 Wheelchair Mobility Wheelchair Mobility: Yes Wheelchair Assistance: Minimal assistance - Patient >75% Wheelchair Propulsion: Both upper extremities Wheelchair Parts Management: Needs assistance Distance: 150   Pt received sitting in recliner and agreeable to PT. PT instructed patient in PT Evaluation and initiated treatment intervention; see above for results. PT educated patient in [place], rehab potential, rehab goals, and discharge recommendations along with recommendation for follow-up rehabilitation services.  Gait training without ad X 25FT and min assist with HHA and then with RW x 133f with min assist as listed. PT instructed pt in TUG: 29 sec (average of 3 trials; >13.5 sec indicates increased fall risk) Pt performed 5 time sit<>stand (5xSTS): 17 sec (>15 sec indicates increased fall risk). Stair management as listed above with BUE supported. Pt unable to perform without UE due to LE weakness and fear of falling. Patient returned to room and performed stand pivot to recliner with min assist and no AD. Pt left sitting in recliner with call bell in reach and all needs met.           Discharge Criteria: Patient will be discharged from PT if patient refuses treatment 3 consecutive times without medical reason, if treatment goals not met, if there is a change in medical status, if patient makes no  progress towards goals or if patient is discharged from hospital.  The above assessment, treatment plan, treatment alternatives and goals were discussed and mutually agreed upon: by patient  ALorie Phenix12/16/2022, 12:27 PM

## 2021-06-02 NOTE — IPOC Note (Signed)
Overall Plan of Care [facility]) Patient Details Name: Jerry Wheeler MRN: [id number] DOB: Apr 16, 1976  Admitting Diagnosis: Lumbar disc disorder with myelopathy  Hospital Problems: Principal Problem:   Lumbar disc disorder with myelopathy     Functional Problem List: Nursing Behavior, Endurance, Medication Management, Safety  PT Balance, Endurance, Motor, Sensory  OT Balance, Endurance, Motor, Sensory  SLP    TR         Basic ADLs: OT Grooming, Bathing, Dressing, Toileting     Advanced  ADLs: OT       Transfers: PT Bed Mobility, Bed to Chair, Car, [employer], Civil Service fast streamer, Research scientist (life sciences): PT Ambulation, Psychologist, prison and probation services, Stairs     Additional Impairments: OT None  SLP        TR      Anticipated Outcomes Item Anticipated Outcome  Self Feeding N/A  Swallowing      Basic self-care  Mod I - S  Toileting  Mod I   Bathroom Transfers Mod I - S  Bowel/Bladder  n/a  Transfers  Mod I with LRAD  Locomotion  ambulatory at mod I level with LRAD  Communication     Cognition     Pain  n/a  Safety/Judgment  Mod I and no falls   Therapy Plan: PT Intensity: Minimum of 1-2 x/day ,45 to 90 minutes PT Frequency: 5 out of 7 days PT Duration Estimated Length of Stay: 7 - 10 days OT Intensity: Minimum of 1-2 x/day, 45 to 90 minutes OT Frequency: 5 out of 7 days OT Duration/Estimated Length of Stay: 7-10 days     Due to the current state of emergency, patients may not be receiving their 3-hours of Medicare-mandated therapy.   Team Interventions: Nursing Interventions Patient/Family Education, Disease Management/Prevention, Medication Management, Discharge Planning  PT interventions Ambulation/gait training, Community reintegration, DME/adaptive equipment instruction, Neuromuscular re-education, Psychosocial support, Stair training, UE/LE Strength taining/ROM, Wheelchair propulsion/positioning, UE/LE Coordination activities, Therapeutic  Activities, Skin care/wound management, Pain management, Functional electrical stimulation, Discharge planning, Balance/vestibular training, Cognitive remediation/compensation, Disease management/prevention, Functional mobility training, Patient/family education, Splinting/orthotics, Therapeutic Exercise, Visual/perceptual remediation/compensation  OT Interventions Warden/ranger, Community reintegration, Discharge planning, Disease mangement/prevention, Fish farm manager, Functional mobility training, Patient/family education, Self Care/advanced ADL retraining, Therapeutic Activities, Therapeutic Exercise, UE/LE Coordination activities  SLP Interventions    TR Interventions    SW/CM Interventions     Barriers to Discharge MD  Medical stability  Nursing Decreased caregiver support, Home environment access/layout, Lack of/limited family support, Weight, Weight bearing restrictions, Medication compliance, Behavior Lives in 1 level home with 3-4 steps to enter and rails to enter. Brother can provide min assist at discharge.  PT Inaccessible home environment, Decreased caregiver support, Home environment access/layout    OT Inaccessible home environment, Home environment access/layout    SLP      SW       Team Discharge Planning: Destination: PT-Home ,OT- Home , SLP-  Projected Follow-up: PT-Outpatient PT, OT-  Home health OT, SLP-  Projected Equipment Needs: PT-To be determined, Rolling walker with 5" wheels, OT- To be determined, SLP-  Equipment Details: PT- , OT-  Patient/family involved in discharge planning: PT- Patient,  OT-Patient, SLP-   MD ELOS: 7-10 days Medical Rehab Prognosis:  Excellent Assessment: The patient has been admitted for CIR therapies with the diagnosis of lumbar myelopathy/radiculopathy in the setting of progressive diabetic peripheral neuropathy leading to a gait disorder. The team will be addressing functional mobility, strength,  stamina, balance, safety, adaptive  techniques and equipment, self-care, bowel and bladder mgt, patient and caregiver education, NMR, pain control. Goals have been set at mod I for mobility and self-care.   Due to the current state of emergency, patients may not be receiving their 3 hours per day of Medicare-mandated therapy.    Ranelle Oyster, MD, FAAPMR     See Team Conference Notes for weekly updates to the plan of care

## 2021-06-02 NOTE — Progress Notes (Signed)
Occupational Therapy Session Note  Patient Details  Name: Jerry Wheeler MRN: [id number] Date of Birth: [birth year]  Today's Date: 06/02/2021 OT Individual Time: 0254-2706 OT Individual Time Calculation (min): 61 min    Short Term Goals: Week 1:  OT Short Term Goal 1 (Week 1): STG=LTG  Skilled Therapeutic Interventions/Progress Updates:  Pt greeted seated in recliner agreeable to OT intervention. Session focus on dynamic standing balance, functional mobility, and LB/UB strength and endurance. Pt completed functional mobility from room to gym with rw and CGA. Started working on dynamic standing balance with pt reaching out of BOS to toss horseshoes to target  as well as dealing cards on table standing on both compliant and non compliant surfaces, pt completed task with overall CGA with unilateral support on RW. Pt completed Standing cone taps 2 mins total with 1 min seated rest break in between to faciliate improved BLE strength and endurance for higher level functional mobility as pt reports his biggest goal is to be able to ascend/descend the stairs.  Pt completed Seated BUE therex with 2 5lb hand weights to facilitate improved BUE strength and overall endurance for higher level functional mobility tasks. Pt completed therex as indicated below: 2x10 Bicep curls 2x10 Upright rows 2x10 Chest presses 2x10 Forward rows Discussed shower set- up with pt reporting tub shower, demo'ed use of shower bench for energy conservation. Pt hesitantly agreeable depending on how pt progresses. Will continue to discuss DME needs. Initial education provided on general energy conservation strategies for home with pt verbalizing understanding of education. Pt needed ~ 5 min seated rest break prior to walking back to room. CGA for functional mobility back to room with Rw, pt left seated in recliner with all needs within reach.                                      Therapy Documentation Precautions:   Precautions Precautions: Fall Restrictions Weight Bearing Restrictions: No  Pain: unrated pain in BLEs reported post standing activities, rest breaks provided as needed for pain mgmt.     Therapy/Group: Individual Therapy  Barron Schmid 06/02/2021, 3:42 PM

## 2021-06-02 NOTE — Progress Notes (Signed)
Inpatient Rehabilitation  Patient information reviewed and entered into eRehab system by Lalah Durango Porchea Charrier, OTR/L.   Information including medical coding, functional ability and quality indicators will be reviewed and updated through discharge.    

## 2021-06-02 NOTE — Progress Notes (Signed)
Inpatient Rehabilitation Admission Medication Review by a Pharmacist  A complete drug regimen review was completed for this patient to identify any potential clinically significant medication issues.  High Risk Drug Classes Is patient taking? Indication by Medication  Antipsychotic Yes Cariprazine and Lithium for Bipolar, Compazine for nausea  Anticoagulant Yes Lovenox for VTE prophx.  Antibiotic No   Opioid No   Antiplatelet No   Hypoglycemics/insulin Yes U500 for DM  Vasoactive Medication Yes Norvasc for HTN  Chemotherapy No   Other No      Type of Medication Issue Identified Description of Issue Recommendation(s)  Drug Interaction(s) (clinically significant)     Duplicate Therapy     Allergy     No Medication Administration End Date     Incorrect Dose     Additional Drug Therapy Needed  ASA 81mg /d, Metformin, Moujaro Resume ASA81mg /d and Metformin. Moujaro not stocked-HOLD.  Significant med changes from prior encounter (inform family/care partners about these prior to discharge).    Other       Clinically significant medication issues were identified that warrant physician communication and completion of prescribed/recommended actions by midnight of the next day:  Yes  Name of provider notified for urgent issues identified:  Provider Method of Notification: chat    Pharmacist comments: will f/u in AM  Time spent performing this drug regimen review (minutes):  10-67min  Adden 12/16: Contacted Dr. 1/17 and Berline Chough. No response. Currently holding.   Torrey Ballinas S. Marissa Nestle, PharmD, BCPS Clinical Staff Pharmacist [email or web address] Merilynn Finland 06/02/2021 8:59 AM

## 2021-06-02 NOTE — Progress Notes (Addendum)
PROGRESS NOTE   Subjective/Complaints: Pt had a pretty good night. Was able to sleep. Denies any pain. Ready for therapies today.   ROS: Patient denies fever, rash, sore throat, blurred vision, nausea, vomiting, diarrhea, cough, shortness of breath or chest pain, joint or back pain, headache, or mood change.   Objective:   No results found. Recent Labs    06/01/21 0525 06/02/21 0509  WBC 8.6 9.6  HGB 12.7* 13.5  HCT 39.6 43.1  PLT 268 268   Recent Labs    06/01/21 0525 06/02/21 0509  NA 138 137  K 3.8 4.1  CL 107 107  CO2 24 23  GLUCOSE 81 74  BUN 15 13  CREATININE 0.93 1.08  CALCIUM 9.8 10.1    Intake/Output Summary (Last 24 hours) at 06/02/2021 1010 Last data filed at 06/02/2021 0263 Gross per 24 hour  Intake 540 ml  Output 1675 ml  Net -1135 ml        Physical Exam: Vital Signs Blood pressure (!) 147/90, pulse 72, temperature 97.6 F (36.4 C), temperature source Oral, resp. rate 18, height 6\' 7"  (2.007 m), weight (!) 156 kg, SpO2 98 %.  General: Alert and oriented x 3, No apparent distress. Large frame HEENT: Head is normocephalic, atraumatic, PERRLA, EOMI, sclera anicteric, oral mucosa pink and moist, dentition intact, ext ear canals clear,  Neck: Supple without JVD or lymphadenopathy Heart: Reg rate and rhythm. No murmurs rubs or gallops Chest: CTA bilaterally without wheezes, rales, or rhonchi; no distress Abdomen: Soft, non-tender, non-distended, bowel sounds positive. Extremities: No clubbing, cyanosis, or edema. Pulses are 2+ Psych: Pt's affect is appropriate. Pt is cooperative Skin: Clean and intact without signs of breakdown Neuro:  Alert and oriented x 3. Normal insight and awareness. Intact Memory. Normal language and speech. Cranial nerve exam unremarkable. UE 4/5 with some proximal weakness. LE: 4-/5 prox to 4/5 distally. Stocking glove sensory loss to ankles bilaterally. DTR's  1+ Musculoskeletal: normal AROM and PROM. No back pain even with SLR's    Assessment/Plan: 1. Functional deficits which require 3+ hours per day of interdisciplinary therapy in a comprehensive inpatient rehab setting. Physiatrist is providing close team supervision and 24 hour management of active medical problems listed below. Physiatrist and rehab team continue to assess barriers to discharge/monitor patient progress toward functional and medical goals  Care Tool:  Bathing              Bathing assist       Upper Body Dressing/Undressing Upper body dressing        Upper body assist      Lower Body Dressing/Undressing Lower body dressing            Lower body assist       Toileting Toileting    Toileting assist Assist for toileting: Independent with assistive device Assistive Device Comment: URINAL   Transfers Chair/bed transfer  Transfers assist           Locomotion Ambulation   Ambulation assist              Walk 10 feet activity   Assist  Walk 50 feet activity   Assist           Walk 150 feet activity   Assist           Walk 10 feet on uneven surface  activity   Assist           Wheelchair     Assist               Wheelchair 50 feet with 2 turns activity    Assist            Wheelchair 150 feet activity     Assist          Blood pressure (!) 147/90, pulse 72, temperature 97.6 F (36.4 C), temperature source Oral, resp. rate 18, height 6\' 7"  (2.007 m), weight (!) 156 kg, SpO2 98 %.  Medical Problem List and Plan: 1. Functional deficits secondary to diabetic peripheral neuropathy and severe lumbar spinal stenosis.             -patient may shower             -ELOS/Goals: 1 week              Patient is beginning CIR therapies today including PT and OT  2.  Impaired mobility, ambulating 20 feet: continue Lovenox.  3. Pain Management: N/A 10. Bipolar disorder w/  anxiety: Continue Lamictal,  Vaylar and Lithium  --Followed by Dr. (Psychiatry in [place]) -pt's mood appropriate thus far 5. Neuropsych: This patient is capable of making decisions on her own behalf. 6. Skin/Wound Care: Routine pressure relief measures. 7. Fluids/Electrolytes/Nutrition: Monitor I/O- 8. T2DM: Hgb A1C-11.3 and poorly controlled. Per records out of ozempic X 4 weeks due to national back order.              --Per 12/07 endocrine visit--> on Humulin R-500 95 units am/75 units lunch/85 units supper and was to transition to Mounjaro 5 mg/day. Continue to hold metformin             --monitor BS ac/hs   --SSI has not been used as patient appears brittle.   --blood sugars in 70's prior to admit. Humulin decreased to 80  units TID per recommendation by DM coordinator.   CBG (last 3)  Recent Labs    06/01/21 1649 06/01/21 2107 06/02/21 0632  GLUCAP 129* 103* 77  12/16 reasonable control so far. Observe for hypoglycemia  9. HTN: Monitor BP TID. Continue Norvasc daily 11. OSA: Resume CPAP 12.Chronic insomnia: Managed by Hydralazine HS prn? 13.  H/o emphysema: Has on going use of E-ciggs 14. Gastroparesis: Intermittent N/V reported. Pt denied nausea this morning -admission  plan was to order KUB to evaluate stool burden but not done. I'll go ahead and order today *addendum: kub reviewed and only mild/moderate stool, normal pattern -on no soft/laxatives--will add senna-s at bedtime, prn sorbitol     LOS: 1 days A FACE TO FACE EVALUATION WAS PERFORMED  1/17 06/02/2021, 10:10 AM

## 2021-06-02 NOTE — Progress Notes (Signed)
Hypoglycemic Event  CBG: 63  Treatment: 8 oz juice/soda  Symptoms: None  Follow-up CBG: Time:2124 CBG Result:73  Possible Reasons for Event: Unknown  Comments/MD notified:Dr. Doroteo Bradford notified. No orders received at this time.    Martyna Thorns, [employer]

## 2021-06-02 NOTE — Progress Notes (Deleted)
[facility]patient Rehabilitation Center Individual Statement of Services  Patient Name:  Jerry Wheeler  Date:  06/02/2021  Welcome to the Inpatient Rehabilitation [facility].  Our goal is to provide you with an individualized program based on your diagnosis and situation, designed to meet your specific needs.  With this comprehensive rehabilitation program, you will be expected to participate in at least 3 hours of rehabilitation therapies Monday-Friday, with modified therapy programming on the weekends.  Your rehabilitation program will include the following services:  Physical Therapy (PT), Occupational Therapy (OT), Speech Therapy (ST), 24 hour per day rehabilitation nursing, Therapeutic Recreaction (TR), Neuropsychology, Care Coordinator, Rehabilitation Medicine, Nutrition Services, Pharmacy Services, and Other  Weekly team conferences will be held on Wednesdays to discuss your progress.  Your Inpatient Rehabilitation Care Coordinator will talk with you frequently to get your input and to update you on team discussions.  Team conferences with you and your family in attendance may also be held.  Expected length of stay: 5-7 Days  Overall anticipated outcome:  MOD I  Depending on your progress and recovery, your program may change. Your Inpatient Rehabilitation Care Coordinator will coordinate services and will keep you informed of any changes. Your Inpatient Rehabilitation Care Coordinator's name and contact numbers are listed  below.  The following services may also be recommended but are not provided by the Inpatient Rehabilitation Center:   Home Health Rehabiltiation Services Outpatient Rehabilitation Services    Arrangements will be made to provide these services after discharge if needed.  Arrangements include referral to agencies that provide these services.  Your insurance has been verified to be:  Medicare Your primary doctor is:  Marisue Ivan, MD  Pertinent information will be  shared with your doctor and your insurance company.  Inpatient Rehabilitation Care Coordinator:  Lavera Guise, [place] [phone] or [phone]  Information discussed with and copy given to patient by: Andria Rhein, 06/02/2021, 10:40 AM

## 2021-06-02 NOTE — Plan of Care (Signed)
°  Problem: RH Balance Goal: LTG Patient will maintain dynamic sitting balance (PT) Description: LTG:  Patient will maintain dynamic sitting balance with assistance during mobility activities (PT) Flowsheets (Taken 06/02/2021 1149) LTG: Pt will maintain dynamic sitting balance during mobility activities with:: Independent Goal: LTG Patient will maintain dynamic standing balance (PT) Description: LTG:  Patient will maintain dynamic standing balance with assistance during mobility activities (PT) Flowsheets (Taken 06/02/2021 1149) LTG: Pt will maintain dynamic standing balance during mobility activities with:: Independent with assistive device    Problem: RH Bed Mobility Goal: LTG Patient will perform bed mobility with assist (PT) Description: LTG: Patient will perform bed mobility with assistance, with/without cues (PT). Flowsheets (Taken 06/02/2021 1149) LTG: Pt will perform bed mobility with assistance level of: Independent   Problem: RH Bed to Chair Transfers Goal: LTG Patient will perform bed/chair transfers w/assist (PT) Description: LTG: Patient will perform bed to chair transfers with assistance (PT). Flowsheets (Taken 06/02/2021 1149) LTG: Pt will perform Bed to Chair Transfers with assistance level: Independent with assistive device    Problem: RH Car Transfers Goal: LTG Patient will perform car transfers with assist (PT) Description: LTG: Patient will perform car transfers with assistance (PT). Flowsheets (Taken 06/02/2021 1149) LTG: Pt will perform car transfers with assist:: Supervision/Verbal cueing   Problem: RH Furniture Transfers Goal: LTG Patient will perform furniture transfers w/assist (OT/PT) Description: LTG: Patient will perform furniture transfers  with assistance (OT/PT). Flowsheets (Taken 06/02/2021 1149) LTG: Pt will perform furniture transfers with assist:: Independent with assistive device    Problem: RH Ambulation Goal: LTG Patient will ambulate in  controlled environment (PT) Description: LTG: Patient will ambulate in a controlled environment, # of feet with assistance (PT). Flowsheets (Taken 06/02/2021 1149) LTG: Pt will ambulate in controlled environ  assist needed:: Independent with assistive device LTG: Ambulation distance in controlled environment: 152ft with LRAD Goal: LTG Patient will ambulate in home environment (PT) Description: LTG: Patient will ambulate in home environment, # of feet with assistance (PT). Flowsheets (Taken 06/02/2021 1149) LTG: Pt will ambulate in home environ  assist needed:: Independent with assistive device LTG: Ambulation distance in home environment: 32ft with LRAD   Problem: RH Wheelchair Mobility Goal: LTG Patient will propel w/c in controlled environment (PT) Description: LTG: Patient will propel wheelchair in controlled environment, # of feet with assist (PT) Flowsheets (Taken 06/02/2021 1149) LTG: Pt will propel w/c in controlled environ  assist needed:: Independent with assistive device LTG: Propel w/c distance in controlled environment: 150   Problem: RH Stairs Goal: LTG Patient will ambulate up and down stairs w/assist (PT) Description: LTG: Patient will ambulate up and down # of stairs with assistance (PT) Flowsheets (Taken 06/02/2021 1149) LTG: Pt will ambulate up/down stairs assist needed:: Supervision/Verbal cueing LTG: Pt will  ambulate up and down number of stairs: 4 steps with 1 rail

## 2021-06-02 NOTE — Evaluation (Signed)
Occupational Therapy Assessment and Plan  Patient Details  Name: Jerry Wheeler MRN: [id number] Date of Birth: [birth year]  OT Diagnosis: muscle weakness (generalized) Rehab Potential: Rehab Potential (ACUTE ONLY): Good ELOS: 7-10 days   Today's Date: 06/02/2021 OT Individual Time: 3557-3220 OT Individual Time Calculation (min): 74 min     Hospital Problem: Principal Problem:   Lumbar disc disorder with myelopathy   Past Medical History:  Past Medical History:  Diagnosis Date   Anxiety disorder    Bipolar disorder ([place])    Diabetes mellitus ([place])    Diabetic peripheral neuropathy associated with type 2 diabetes mellitus ([place])    Emphysema lung ([place])    hospitalization 1999   High blood pressure    Major depressive disorder in partial remission ([place])    Morbid obesity ([place])    Osteomyelitis of great toe of left foot ([place])    Sleep apnea    Tobacco use disorder, continuous    Ulcer of right heel ([place])    followed by podiatry--fully healed 09/2020   Past Surgical History:  Past Surgical History:  Procedure Laterality Date   DG GREAT TOE LEFT FOOT  2010   s/p I and D for osteo    Assessment & Plan Clinical Impression: Jerry Wheeler is a 45 year old male with history of poorly controlled T2DM with peripheral neuropathy, bipolar disorder, OSA who was admitted to [facility] on 05/26/21 with worsening of BLE weakness and  numbness for 3 weeks progressing to inability to walk x one day. Neurology consulted and MRI spine done revealing  severe canal stenosis L3/L4, moderate canal stenosis L2/L3 and mild to moderate canal stenosis with  moderate to severe left foraminal stenosis at L4/L5. LP done was bloody and revealing elevated protein-287, WBC 12 and glucose 122.  Protein elevated due to 4000k red cells and no pleocytosis with hyporeflexia felt to be due to diabetes and NS evaluation recommended by Dr. Quinn Axe.  Dr. Lacinda Axon consulted for input and as no compression signs noted,  recommended aggressive management of DM and outpatient follow up for input on treatment as patient also not interested in surgery at this time. Therapy initiated and  patient continues to be limited by weakness with sensory deficits, balance deficits and fatigue affecting ADLs and mobility. Patient transferred to CIR on 06/01/2021 .    Patient currently requires min with basic self-care skills secondary to muscle weakness and impaired timing and sequencing, unbalanced muscle activation, and decreased coordination.  Prior to hospitalization, patient could complete ADLs/IADLs with independence.  Patient will benefit from skilled intervention to decrease level of assist with basic self-care skills, increase independence with basic self-care skills, and increase level of independence with iADL prior to discharge home independently with PRN supervision/assist from family.  Anticipate patient will require intermittent supervision and follow up home health.  OT - End of Session Activity Tolerance: Tolerates 30+ min activity with multiple rests Endurance Deficit: Yes OT Assessment Rehab Potential (ACUTE ONLY): Good OT Barriers to Discharge: Inaccessible home environment;Home environment access/layout OT Patient demonstrates impairments in the following area(s): Balance;Endurance;Motor;Sensory OT Basic ADL's Functional Problem(s): Grooming;Bathing;Dressing;Toileting OT Transfers Functional Problem(s): Toilet;Tub/Shower OT Additional Impairment(s): None OT Plan OT Intensity: Minimum of 1-2 x/day, 45 to 90 minutes OT Frequency: 5 out of 7 days OT Duration/Estimated Length of Stay: 7-10 days OT Treatment/Interventions: Balance/vestibular training;Community reintegration;Discharge planning;Disease mangement/prevention;DME/adaptive equipment instruction;Functional mobility training;Patient/family education;Self Care/advanced ADL retraining;Therapeutic Activities;Therapeutic Exercise;UE/LE Coordination  activities OT Self Feeding Anticipated Outcome(s): N/A OT Basic Self-Care Anticipated Outcome(s): Mod  I - S OT Toileting Anticipated Outcome(s): Mod I OT Bathroom Transfers Anticipated Outcome(s): Mod I - S OT Recommendation Patient destination: Home Follow Up Recommendations: Home health OT Equipment Recommended: To be determined   OT Evaluation Precautions/Restrictions  Precautions Precautions: Fall Restrictions Weight Bearing Restrictions: No General Chart Reviewed: Yes Family/Caregiver Present: No Vital Signs Therapy Vitals Pulse Rate: 72 BP: (!) 147/90 Patient Position (if appropriate): Lying Pain Pain Assessment Pain Scale: 0-10 Pain Score: 0-No pain Home Living/Prior Functioning Home Living Family/patient expects to be discharged to:: Private residence Living Arrangements: Alone Available Help at Discharge: Family Type of Home: House (Information below is brothers home set-up) Home Access: Stairs to enter Technical brewer of Steps: 4 Entrance Stairs-Rails: Right Home Layout: Two level, Able to live on main level with bedroom/bathroom Alternate Level Stairs-Rails: Right, Left Bathroom Shower/Tub: Tub/shower unit, Architectural technologist: Standard Bathroom Accessibility: Yes  Lives With: Alone IADL History Homemaking Responsibilities: Yes Meal Prep Responsibility: Primary Laundry Responsibility: Primary Cleaning Responsibility: Primary Bill Paying/Finance Responsibility: Primary Shopping Responsibility: Primary Occupation: On disability Leisure and Hobbies: Catering manager Prior Function Level of Independence: Independent with basic ADLs, Independent with homemaking with ambulation  Able to Take Stairs?: Yes Driving: Yes Vision Baseline Vision/History: 1 Wears glasses Patient Visual Report: No change from baseline Vision Assessment?: No apparent visual deficits Perception  Perception: Within Functional Limits Praxis Praxis:  Intact Cognition Overall Cognitive Status: Within Functional Limits for tasks assessed Arousal/Alertness: Awake/alert Orientation Level: Person;Place;Situation Person: Oriented Place: Oriented Situation: Oriented Year: 2022 Month: December Day of Week: Correct Memory: Appears intact Immediate Memory Recall: Sock;Blue;Bed Memory Recall Sock: Without Cue Memory Recall Blue: Without Cue Memory Recall Bed: Without Cue Awareness: Appears intact Problem Solving: Appears intact Sensation Sensation Light Touch: Impaired by gross assessment (Intact in BUE; Hx of severe neuropathy in BLE) Hot/Cold: Appears Intact (In UE) Proprioception: Impaired by gross assessment (Intact in BUE; decreased proprioception in BLE with vision occluded 2/2 neuropathy) Coordination Gross Motor Movements are Fluid and Coordinated: Yes Fine Motor Movements are Fluid and Coordinated: No (BUE tremors RUE<LUE at baseline) Finger Nose Finger Test: Impaired 2/2 chronic BUE tremors. Motor  Motor Motor: Other (comment) Motor - Skilled Clinical Observations: mild tremor  Trunk/Postural Assessment  Cervical Assessment Cervical Assessment: Within Functional Limits Thoracic Assessment Thoracic Assessment: Exceptions to [facility] (Large body habitus) Lumbar Assessment Lumbar Assessment: Within Functional Limits Postural Control Postural Control: Deficits on evaluation Trunk Control: Delayed  Balance Balance Balance Assessed: Yes Static Sitting Balance Static Sitting - Balance Support: No upper extremity supported Static Sitting - Level of Assistance: 5: Stand by assistance Dynamic Sitting Balance Dynamic Sitting - Balance Support: No upper extremity supported Dynamic Sitting - Level of Assistance: 5: Stand by assistance Sitting balance - Comments: Demonstrates good sitting balance during seated ADLs Static Standing Balance Static Standing - Balance Support: Left upper extremity supported;Right upper extremity  supported Static Standing - Level of Assistance: 5: Stand by assistance (Min gaurd) Dynamic Standing Balance Dynamic Standing - Balance Support: Left upper extremity supported;Right upper extremity supported Dynamic Standing - Level of Assistance: 4: Min assist Dynamic Standing - Comments: During functional tasks Extremity/Trunk Assessment RUE Assessment RUE Assessment: Within Functional Limits Passive Range of Motion (PROM) Comments: WFL Active Range of Motion (AROM) Comments: WFL General Strength Comments: MMT grossly 5/5 LUE Assessment LUE Assessment: Within Functional Limits Passive Range of Motion (PROM) Comments: WFL Active Range of Motion (AROM) Comments: WFL General Strength Comments: MMT grossly 5/5  Care Tool Care Tool Self Care Eating  Eating Assist Level: Independent    Oral Care    Oral Care Assist Level: Contact Guard/Toucning assist    Bathing   Body parts bathed by patient: Right arm;Left arm;Chest;Abdomen;Left upper leg;Right lower leg;Left lower leg;Face;Front perineal area;Right upper leg Body parts bathed by helper: Buttocks   Assist Level: Minimal Assistance - Patient > 75%    Upper Body Dressing(including orthotics)   What is the patient wearing?: Pull over shirt   Assist Level: Set up assist    Lower Body Dressing (excluding footwear)   What is the patient wearing?: Pants;Underwear/pull up Assist for lower body dressing: Minimal Assistance - Patient > 75%    Putting on/Taking off footwear   What is the patient wearing?: Socks;Shoes Assist for footwear: Set up assist       Care Tool Toileting Toileting activity   Assist for toileting: Minimal Assistance - Patient > 75%     Care Tool Bed Mobility Roll left and right activity   Roll left and right assist level: Supervision/Verbal cueing    Sit to lying activity        Lying to sitting on side of bed activity   Lying to sitting on side of bed assist level: the ability to move from lying  on the back to sitting on the side of the bed with no back support.: Supervision/Verbal cueing     Care Tool Transfers Sit to stand transfer   Sit to stand assist level: Contact Guard/Touching assist    Chair/bed transfer   Chair/bed transfer assist level: Minimal Assistance - Patient > 75%     Toilet transfer   Assist Level: Minimal Assistance - Patient > 75%     Care Tool Cognition  Expression of Ideas and Wants Expression of Ideas and Wants: 4. Without difficulty (complex and basic) - expresses complex messages without difficulty and with speech that is clear and easy to understand  Understanding Verbal and Non-Verbal Content Understanding Verbal and Non-Verbal Content: 4. Understands (complex and basic) - clear comprehension without cues or repetitions   Memory/Recall Ability Memory/Recall Ability : Current season;That he or she is in a hospital/hospital unit   Refer to Care Plan for [place] 1 OT Short Term Goal 1 (Week 1): STG=LTG  Recommendations for other services: Other: TBD    Skilled Therapeutic Intervention Patient met lying supine in bed in agreement with OT treatment session. 0/10 pain reported at rest and with activity. Education provided on purpose of CIR, role of OT and ELOS. Goals established with patient input. Patient states that he wants to be independent and be able to ambulate unassisted. Supine to EOB with supervision A and sit to stand from EOB with Min - Min guard and HHA. Patient reports unsteadiness on feet. Functional mobility to commode in bathroom. Toilet transfer and 3/3 parts of toileting task with Min - Min guard on standard height commode in bathroom. Assist mostly needed for maintain static standing balance. Walk-in shower transfer and bathing/dressing at shower level with [place] guard. Please refer below for additional information. Session concluded with patient seated in bari recliner with call bell within reach and  chair alarm activated. Patient limited by decreased static/dynamic standing balance, decreased activity tolerance and BLE weakness and would benefit from CIR level therapies to maximize safety/independence with self-care tasks.   ADL ADL Eating: Independent Where Assessed-Eating: Chair Grooming: Contact guard Where Assessed-Grooming: Standing at sink Upper Body Bathing: Setup Where Assessed-Upper Body Bathing:  Shower Lower Body Bathing: Minimal assistance Where Assessed-Lower Body Bathing: Shower Upper Body Dressing: Setup Where Assessed-Upper Body Dressing: Edge of bed Lower Body Dressing: Minimal assistance Where Assessed-Lower Body Dressing: Edge of bed Toileting: Minimal assistance Where Assessed-Toileting: Glass blower/designer: Minimal Print production planner Method: Ambulating Tub/Shower Transfer: Metallurgist Method: Ambulating Mobility  Bed Mobility Bed Mobility: Supine to Sit;Sitting - Scoot to Edge of Bed Supine to Sit: Supervision/Verbal cueing Sitting - Scoot to Edge of Bed: Supervision/Verbal cueing Transfers Sit to Stand: Contact Guard/Touching assist Stand to Sit: Contact Guard/Touching assist   Discharge Criteria: Patient will be discharged from OT if patient refuses treatment 3 consecutive times without medical reason, if treatment goals not met, if there is a change in medical status, if patient makes no progress towards goals or if patient is discharged from hospital.  The above assessment, treatment plan, treatment alternatives and goals were discussed and mutually agreed upon: by patient  Storey Stangeland R [place]-Davis 06/02/2021, 10:40 AM

## 2021-06-03 LAB — GLUCOSE, CAPILLARY
Glucose-Capillary: 91 mg/dL (ref 70–99)
Glucose-Capillary: 168 mg/dL — ABNORMAL HIGH (ref 70–99)
Glucose-Capillary: 87 mg/dL (ref 70–99)
Glucose-Capillary: 104 mg/dL — ABNORMAL HIGH (ref 70–99)

## 2021-06-03 MED ORDER — SENNOSIDES-DOCUSATE SODIUM 8.6-50 MG PO TABS
2.0000 | ORAL_TABLET | Freq: Two times a day (BID) | ORAL | Status: DC
  Administered 2021-06-03 – 2021-06-09 (×12): 2 via ORAL
  Filled 2021-06-03 (×12): qty 2

## 2021-06-03 MED ORDER — INSULIN REGULAR HUMAN (CONC) 500 UNIT/ML ~~LOC~~ SOPN
75.0000 [IU] | PEN_INJECTOR | Freq: Three times a day (TID) | SUBCUTANEOUS | Status: DC
  Administered 2021-06-03 (×3): 75 [IU] via SUBCUTANEOUS
  Filled 2021-06-03: qty 3

## 2021-06-03 NOTE — Progress Notes (Signed)
PROGRESS NOTE   Subjective/Complaints:   No new issues overnite, + constipation, LBM 3d ago,  no bladder issues, no pain c/os  ROS: Patient denies CP, SOB, N/V/D.   Objective:   DG Abd 1 View  Result Date: 06/02/2021 CLINICAL DATA:  Obstipation. EXAM: ABDOMEN - 1 VIEW COMPARISON:  None. FINDINGS: Nonobstructive bowel gas pattern. Mild-to-moderate amount of stool in the colon. Stool and gas in the rectum. No small bowel dilatation. IMPRESSION: Normal bowel gas pattern. Mild-to-moderate retained stool in the colon. Electronically Signed   By: Marlan Palau M.D.   On: 06/02/2021 15:13   Recent Labs    06/01/21 0525 06/02/21 0509  WBC 8.6 9.6  HGB 12.7* 13.5  HCT 39.6 43.1  PLT 268 268    Recent Labs    06/01/21 0525 06/02/21 0509  NA 138 137  K 3.8 4.1  CL 107 107  CO2 24 23  GLUCOSE 81 74  BUN 15 13  CREATININE 0.93 1.08  CALCIUM 9.8 10.1     Intake/Output Summary (Last 24 hours) at 06/03/2021 0809 Last data filed at 06/03/2021 0725 Gross per 24 hour  Intake 1256 ml  Output 475 ml  Net 781 ml         Physical Exam: Vital Signs Blood pressure 135/79, pulse 79, temperature 98.1 F (36.7 C), temperature source Oral, resp. rate 16, height 6\' 7"  (2.007 m), weight (!) 156 kg, SpO2 97 %.   General: No acute distress Mood and affect are appropriate Heart: Regular rate and rhythm no rubs murmurs or extra sounds Lungs: Clear to auscultation, breathing unlabored, no rales or wheezes Abdomen: Positive bowel sounds, soft nontender to palpation, nondistended Extremities: No clubbing, cyanosis, or edema Skin: No evidence of breakdown, no evidence of rash  Neuro:  Alert and oriented x 3. Normal insight and awareness. Intact Memory. Normal language and speech. Cranial nerve exam unremarkable. UE 5/5 . LE: 4-/5 prox to 4/5 distally. Stocking glove sensory loss to ankles bilaterally. DTR's 1+ Musculoskeletal:  normal AROM and PROM. No back pain even with SLR's    Assessment/Plan: 1. Functional deficits which require 3+ hours per day of interdisciplinary therapy in a comprehensive inpatient rehab setting. Physiatrist is providing close team supervision and 24 hour management of active medical problems listed below. Physiatrist and rehab team continue to assess barriers to discharge/monitor patient progress toward functional and medical goals  Care Tool:  Bathing    Body parts bathed by patient: Right arm, Left arm, Chest, Abdomen, Left upper leg, Right lower leg, Left lower leg, Face, Front perineal area, Right upper leg   Body parts bathed by helper: Buttocks     Bathing assist Assist Level: Minimal Assistance - Patient > 75%     Upper Body Dressing/Undressing Upper body dressing   What is the patient wearing?: Pull over shirt    Upper body assist Assist Level: Set up assist    Lower Body Dressing/Undressing Lower body dressing      What is the patient wearing?: Pants, Underwear/pull up     Lower body assist Assist for lower body dressing: Minimal Assistance - Patient > 75%     Toileting Toileting  Toileting assist Assist for toileting: Independent with assistive device Assistive Device Comment: urinal   Transfers Chair/bed transfer  Transfers assist     Chair/bed transfer assist level: Contact Guard/Touching assist (RW)     Locomotion Ambulation   Ambulation assist      Assist level: Minimal Assistance - Patient > 75% Assistive device: Hand held assist Max distance: 25   Walk 10 feet activity   Assist     Assist level: Minimal Assistance - Patient > 75% Assistive device: Hand held assist   Walk 50 feet activity   Assist Walk 50 feet with 2 turns activity did not occur: Safety/medical concerns         Walk 150 feet activity   Assist Walk 150 feet activity did not occur: Safety/medical concerns         Walk 10 feet on uneven surface   activity   Assist Walk 10 feet on uneven surfaces activity did not occur: Safety/medical concerns         Wheelchair     Assist   Type of Wheelchair: Manual    Wheelchair assist level: Minimal Assistance - Patient > 75% Max wheelchair distance: 150    Wheelchair 50 feet with 2 turns activity    Assist        Assist Level: Minimal Assistance - Patient > 75%   Wheelchair 150 feet activity     Assist      Assist Level: Minimal Assistance - Patient > 75%   Blood pressure 135/79, pulse 79, temperature 98.1 F (36.7 C), temperature source Oral, resp. rate 16, height 6\' 7"  (2.007 m), weight (!) 156 kg, SpO2 97 %.  Medical Problem List and Plan: 1. Functional deficits secondary to diabetic peripheral neuropathy and severe lumbar spinal stenosis.             -patient may shower             -ELOS/Goals: 1 week              Patient is beginning CIR therapies today including PT and OT  2.  Impaired mobility, ambulating 20 feet: continue Lovenox.  3. Pain Management: N/A 10. Bipolar disorder w/ anxiety: Continue Lamictal,  Vaylar and Lithium  --Followed by Dr. (Psychiatry in [place]) -pt's mood appropriate thus far 5. Neuropsych: This patient is capable of making decisions on her own behalf. 6. Skin/Wound Care: Routine pressure relief measures. 7. Fluids/Electrolytes/Nutrition: Monitor I/O- 8. T2DM: Hgb A1C-11.3 and poorly controlled. Per records out of ozempic X 4 weeks due to national back order.              --Per 12/07 endocrine visit--> on Humulin R-500 95 units am/75 units lunch/85 units supper and was to transition to Mounjaro 5 mg/day. Continue to hold metformin             --monitor BS ac/hs   --SSI has not been used as patient appears brittle.   --blood sugars in 70's prior to admit. Humulin decreased to 80  units TID per recommendation by DM coordinator.   CBG (last 3)  Recent Labs    06/02/21 2101 06/02/21 2124 06/03/21 0612  GLUCAP 63* 73  91   12/17, low CBG reduce Humulin R to 75 U TID   9. HTN: Monitor BP TID. Continue Norvasc daily 11. OSA: Resume CPAP 12.Chronic insomnia: Managed by Hydralazine HS prn? 13.  H/o emphysema: Has on going use of E-ciggs 14. Gastroparesis: Intermittent N/V reported. Pt denied  nausea this morning -admission  plan was to order KUB to evaluate stool burden but not done. I'll go ahead and order today *addendum: kub reviewed and only mild/moderate stool, normal pattern -will increase  senna-s BID, may need dulc supp today  LOS: 2 days A FACE TO FACE EVALUATION WAS PERFORMED  Erick Colace 06/03/2021, 8:09 AM

## 2021-06-03 NOTE — Progress Notes (Signed)
Physical Therapy Session Note  Patient Details  Name: Jerry Wheeler MRN: [id number] Date of Birth: Feb 02, 1976  Today's Date: 06/03/2021 PT Individual Time: 1000-1100; 1400-1500 PT Individual Time Calculation (min): 60 min and 60 min  Short Term Goals: Week 1:  PT Short Term Goal 1 (Week 1): STG=LTG due to ELOS  Skilled Therapeutic Interventions/Progress Updates:    Session 1: Pt received seated in recliner in room, agreeable to PT session. Pt reports some stiffness in his back, no other complaints of pain. Pt also reports feeling fatigued due to not sleeping well last night. Per nursing pt with elevated BP in standing this AM, requesting BP assessment prior to mobilization. Seated BP 136/86. Sit to stand with min A to RW. Standing BP 143/79 with no change in symptoms, per pt his baseline is 140s/80s. Nursing notified of BP readings. Ambulation 2 x 150 ft with RW and min A for balance, wide BOS. Pt reports fatigue following ambulation. Seated BP 15/78 following ambulation, discussed expected increase in BP following physical activity with patient. Static standing balance initially with one UE support on RW progressing to no UE support with min A while playing cornhole. Pt has occasional LOB posteriorly, cues for ankle strategy to recover balance. Sidesteps L/R 3 x 15 ft with RW and min A for balance with cues for increased LE clearance for balance training and hip strengthening. Forwards/backwards ambulation 3 x 20 ft with RW and min A for balance with cues for ankle and hip strategy to prevent posterior LOB with backwards gait. Pt returned to recliner in room at end of session, needs in reach.  Session 2: Pt received seated in bed, agreeable to PT session. No complaints of pain. Bed mobility Supervision. Seated BP 136/77 and pt remains asymptomatic throughout session. Sit to stand with CGA to RW during session. Ambulation 3 x 150 ft with RW and CGA to min A for balance. Ascend/descend 8 x 6"  stairs with 2 handrails and min A for balance, step-to gait pattern. Ascend 4 x 6" stairs with R handrail to simulate home environment with min A, pt requires 2 handrails for balance to safely descend stairs. Demonstrated how to laterally navigate stairs for BUE support one handrail but pt nervous about this technique due to stair size vs his foot size. Will continue to problem-solve best method for stair navigation upon d/c home. Patient demonstrates increased fall risk as noted by score of  9/56 on Berg Balance Scale.  (<36= high risk for falls, close to 100%; 37-45 significant >80%; 46-51 moderate >50%; 52-55 lower >25%). Pt exhibits onset of posterior LOB without UE support and unable to correct to prevent uncontrolled descent into sitting position on mat table. Reviewed score and functional implications. Ambulation with RW and min A through obstacle course navigating around cones, x 2 reps. Pt reports significant fatigue following obstacle course navigation. Manual w/c propulsion x 150 ft with use of BUE at Supervision level. Stand pivot transfer back to recliner with RW and min A. Pt left seated in w/c in room with needs in reach at end of session.  Therapy Documentation Precautions:  Precautions Precautions: Fall Restrictions Weight Bearing Restrictions: No Balance Balance Assessed: Yes Standardized Balance Assessment Standardized Balance Assessment: Berg Balance Test Berg Balance Test Sit to Stand: Able to stand  independently using hands Standing Unsupported: Unable to stand 30 seconds unassisted Sitting with Back Unsupported but Feet Supported on Floor or Stool: Able to sit 2 minutes under supervision Stand to Sit: Sits  independently, has uncontrolled descent Transfers: Needs one person to assist Standing Unsupported with Eyes Closed: Needs help to keep from falling Standing Ubsupported with Feet Together: Needs help to attain position and unable to hold for 15 seconds From Standing,  Reach Forward with Outstretched Arm: Loses balance while trying/requires external support From Standing Position, Pick up Object from Floor: Unable to try/needs assist to keep balance From Standing Position, Turn to Look Behind Over each Shoulder: Needs assist to keep from losing balance and falling Turn 360 Degrees: Needs assistance while turning Standing Unsupported, Alternately Place Feet on Step/Stool: Needs assistance to keep from falling or unable to try Standing Unsupported, One Foot in Front: Loses balance while stepping or standing Standing on One Leg: Tries to lift leg/unable to hold 3 seconds but remains standing independently Total Score: 9     Therapy/Group: Individual Therapy   Peter Congo, PT, DPT, CSRS  06/03/2021, 5:28 PM

## 2021-06-03 NOTE — Progress Notes (Signed)
Occupational Therapy Session Note  Patient Details  Name: Jerry Wheeler MRN: [id number] Date of Birth: 02/24/1976  Today's Date: 06/03/2021 OT Individual Time: 1027-2536 OT Individual Time Calculation (min): 55 min    Short Term Goals: Week 1:  OT Short Term Goal 1 (Week 1): STG=LTG  Skilled Therapeutic Interventions/Progress Updates:  Pt greeted seated in recliner agreeable to OT intervention. Session focus on BUE therex, functional mobility and BADL reeducation.    Pt completed UB dressing from recliner with set- up assist declining all other ADLs. Pt reports hypertension this AM in standing, pt sit<>stand with rw with CGA, took Standing BP 152/91 (106) HR 103, alerted RN who brought AM meds. Needed to wait on new insulin order therefore completed therex in room with level 4 theraband as indicated below: X10 shoulder flexion X10 bicp curls X10 shoulder extension X10 horizontal shoulder ABD X10 shoulder diagonal pulls X10 punches  X10 shoulder external rotation   Pt completed functional ambulation from room to gym with RW and close supervision. Pt completed reciprocal stair steps on 4 inch step for 2x 1 min each, seated rest break provided in between sets to promote increased BLE strength and endurance for higher level functional mobility tasks. Pt completed task with gross supervision. Pt needed seated rest break post therepautic activity for ~ 3 min then able to ambulate back to room with rw and gross supervision.  Post walk BP checked from sitting;  167/89 ( 110) alerted RN  pt left  seated in recliner with all needs within reach.                      Therapy Documentation Precautions:  Precautions Precautions: Fall Restrictions Weight Bearing Restrictions: No  Pain: generalized discomfort reported in BLEs, rest breaks offered as needed.    Therapy/Group: Individual Therapy  Pollyann [facility] 06/03/2021, 9:03 AM

## 2021-06-04 LAB — GLUCOSE, CAPILLARY
Glucose-Capillary: 63 mg/dL — ABNORMAL LOW (ref 70–99)
Glucose-Capillary: 70 mg/dL (ref 70–99)
Glucose-Capillary: 168 mg/dL — ABNORMAL HIGH (ref 70–99)
Glucose-Capillary: 180 mg/dL — ABNORMAL HIGH (ref 70–99)
Glucose-Capillary: 61 mg/dL — ABNORMAL LOW (ref 70–99)
Glucose-Capillary: 50 mg/dL — ABNORMAL LOW (ref 70–99)
Glucose-Capillary: 61 mg/dL — ABNORMAL LOW (ref 70–99)
Glucose-Capillary: 70 mg/dL (ref 70–99)

## 2021-06-04 MED ORDER — INSULIN REGULAR HUMAN (CONC) 500 UNIT/ML ~~LOC~~ SOPN
70.0000 [IU] | PEN_INJECTOR | Freq: Three times a day (TID) | SUBCUTANEOUS | Status: DC
  Administered 2021-06-04 – 2021-06-05 (×4): 70 [IU] via SUBCUTANEOUS
  Filled 2021-06-04: qty 3

## 2021-06-04 NOTE — Progress Notes (Addendum)
PROGRESS NOTE   Subjective/Complaints:  Low CBG last noc , discussed diabetic management , pt states that his endocrinologist increased insulin shortly prior to hospital admission   ROS: Patient denies CP, SOB, N/V/D.   Objective:   DG Abd 1 View  Result Date: 06/02/2021 CLINICAL DATA:  Obstipation. EXAM: ABDOMEN - 1 VIEW COMPARISON:  None. FINDINGS: Nonobstructive bowel gas pattern. Mild-to-moderate amount of stool in the colon. Stool and gas in the rectum. No small bowel dilatation. IMPRESSION: Normal bowel gas pattern. Mild-to-moderate retained stool in the colon. Electronically Signed   By: Marlan Palau M.D.   On: 06/02/2021 15:13   Recent Labs    06/02/21 0509  WBC 9.6  HGB 13.5  HCT 43.1  PLT 268    Recent Labs    06/02/21 0509  NA 137  K 4.1  CL 107  CO2 23  GLUCOSE 74  BUN 13  CREATININE 1.08  CALCIUM 10.1     Intake/Output Summary (Last 24 hours) at 06/04/2021 0724 Last data filed at 06/04/2021 0610 Gross per 24 hour  Intake 1432 ml  Output --  Net 1432 ml         Physical Exam: Vital Signs Blood pressure 138/78, pulse 81, temperature 97.7 F (36.5 C), temperature source Oral, resp. rate 16, height 6\' 7"  (2.007 m), weight (!) 156 kg, SpO2 98 %.  General: No acute distress Mood and affect are appropriate Heart: Regular rate and rhythm no rubs murmurs or extra sounds Lungs: Clear to auscultation, breathing unlabored, no rales or wheezes Abdomen: Positive bowel sounds, soft nontender to palpation, nondistended Extremities: No clubbing, cyanosis, or edema Skin: No evidence of breakdown, no evidence of rash   Neuro:  Alert and oriented x 3. Normal insight and awareness. Intact Memory. Normal language and speech. Cranial nerve exam unremarkable. UE 5/5 . LE: 4-/5 prox to 4/5 distally. Stocking glove sensory loss to ankles bilaterally. DTR's 1+ Musculoskeletal: normal AROM and PROM. No back  pain even with SLR's    Assessment/Plan: 1. Functional deficits which require 3+ hours per day of interdisciplinary therapy in a comprehensive inpatient rehab setting. Physiatrist is providing close team supervision and 24 hour management of active medical problems listed below. Physiatrist and rehab team continue to assess barriers to discharge/monitor patient progress toward functional and medical goals  Care Tool:  Bathing    Body parts bathed by patient: Right arm, Left arm, Chest, Abdomen, Left upper leg, Right lower leg, Left lower leg, Face, Front perineal area, Right upper leg   Body parts bathed by helper: Buttocks     Bathing assist Assist Level: Minimal Assistance - Patient > 75%     Upper Body Dressing/Undressing Upper body dressing   What is the patient wearing?: Pull over shirt    Upper body assist Assist Level: Set up assist    Lower Body Dressing/Undressing Lower body dressing      What is the patient wearing?: Pants, Underwear/pull up     Lower body assist Assist for lower body dressing: Minimal Assistance - Patient > 75%     Toileting Toileting    Toileting assist Assist for toileting: Independent with assistive device Assistive  Device Comment: urinal   Transfers Chair/bed transfer  Transfers assist     Chair/bed transfer assist level: Contact Guard/Touching assist     Locomotion Ambulation   Ambulation assist      Assist level: Minimal Assistance - Patient > 75% Assistive device: Walker-rolling Max distance: 150'   Walk 10 feet activity   Assist     Assist level: Minimal Assistance - Patient > 75% Assistive device: Walker-rolling   Walk 50 feet activity   Assist Walk 50 feet with 2 turns activity did not occur: Safety/medical concerns  Assist level: Minimal Assistance - Patient > 75% Assistive device: Walker-rolling    Walk 150 feet activity   Assist Walk 150 feet activity did not occur: Safety/medical  concerns  Assist level: Minimal Assistance - Patient > 75% Assistive device: Walker-rolling    Walk 10 feet on uneven surface  activity   Assist Walk 10 feet on uneven surfaces activity did not occur: Safety/medical concerns         Wheelchair     Assist   Type of Wheelchair: Manual    Wheelchair assist level: Supervision/Verbal cueing Max wheelchair distance: 150    Wheelchair 50 feet with 2 turns activity    Assist        Assist Level: Supervision/Verbal cueing   Wheelchair 150 feet activity     Assist      Assist Level: Supervision/Verbal cueing   Blood pressure 138/78, pulse 81, temperature 97.7 F (36.5 C), temperature source Oral, resp. rate 16, height 6\' 7"  (2.007 m), weight (!) 156 kg, SpO2 98 %.  Medical Problem List and Plan: 1. Functional deficits secondary to diabetic peripheral neuropathy and severe lumbar spinal stenosis.             -patient may shower             -ELOS/Goals: 1 week              Patient is beginning CIR therapies today including PT and OT  2.  Impaired mobility, ambulating 20 feet: continue Lovenox.  3. Pain Management: N/A 10. Bipolar disorder w/ anxiety: Continue Lamictal,  Vaylar and Lithium  --Followed by Dr. (Psychiatry in [place]) -pt's mood appropriate thus far 5. Neuropsych: This patient is capable of making decisions on her own behalf. 6. Skin/Wound Care: Routine pressure relief measures. 7. Fluids/Electrolytes/Nutrition: Monitor I/O- 8. T2DM: Hgb A1C-11.3 and poorly controlled. Per records out of ozempic X 4 weeks due to national back order.              --Per 12/07 endocrine visit--> on Humulin R-500 95 units am/75 units lunch/85 units supper and was to transition to Mounjaro 5 mg/day. Continue to hold metformin             --monitor BS ac/hs   --SSI has not been used as patient appears brittle.   --blood sugars in 70's prior to admit. Humulin decreased to 80  units TID per recommendation by DM  coordinator.   CBG (last 3)  Recent Labs    06/03/21 2108 06/04/21 0610 06/04/21 0622  GLUCAP 104* 63* 70   12/17, low CBG reduce Humulin R to 75 U TID  12/18 low CBG reduce Humulin R to 70U TID 9. HTN: Monitor BP TID. Continue Norvasc daily 11. OSA: Resume CPAP 12.Chronic insomnia: Managed by Hydralazine HS prn? 13.  H/o emphysema: Has on going use of E-ciggs 14. Gastroparesis: Intermittent N/V   *addendum: kub reviewed and only mild/moderate  stool, normal pattern -will increase  senna-s BID, dulc supp PRN  LOS: 3 days A FACE TO FACE EVALUATION WAS PERFORMED  Erick Colace 06/04/2021, 7:24 AM

## 2021-06-04 NOTE — Progress Notes (Signed)
Hypoglycemic Event  CBG: 61   Treatment: 4 oz juice/soda  Symptoms: None  Follow-up CBG: Time:935  CBG Result: 51  Possible Reasons for Event: Unknown  Comments/MD notified:Pt denies any symptoms. He stated " Now that I know that my sugar is low, I do feel a little shakey, but if you would have asked me 5 minutes ago, I would have told you I feel completely fine".    Spoke to Dr. Berline Chough during morning rounds.    Melvenia Beam

## 2021-06-04 NOTE — Progress Notes (Signed)
Pt states he can place CPAP on himself when ready for bed. Advised pt to notify for RT if any further assistance is needed.

## 2021-06-04 NOTE — Progress Notes (Signed)
Hypoglycemic Event  CBG: 50  Treatment: 8 oz juice/soda  Symptoms: Shaky  Follow-up CBG: Time:1020  CBG Result:70  Possible Reasons for Event: Unknown  Comments/MD notified: see previous note. Pt received apple juice, 2 packs of gram crackers, a coke, a peach boost, and chocolate ensure. Will continue to monitor.     Melvenia Beam

## 2021-06-04 NOTE — Progress Notes (Signed)
Hypoglycemic Event  CBG: 63  Treatment: 8 oz juice/soda  Symptoms: Shaky  Follow-up CBG: [id number] CBG Result:70  Possible Reasons for Event: Unknown  Comments/MD notified:yes    Ron Junco, [employer]

## 2021-06-05 LAB — BASIC METABOLIC PANEL
Anion gap: 8 (ref 5–15)
BUN: 13 mg/dL (ref 6–20)
CO2: 24 mmol/L (ref 22–32)
Calcium: 10.3 mg/dL (ref 8.9–10.3)
Chloride: 104 mmol/L (ref 98–111)
Creatinine, Ser: 1 mg/dL (ref 0.61–1.24)
GFR, Estimated: >60 mL/min (ref >60)
Glucose, Bld: 244 mg/dL — ABNORMAL HIGH (ref 70–99)
Potassium: 4 mmol/L (ref 3.5–5.1)
Sodium: 136 mmol/L (ref 135–145)

## 2021-06-05 LAB — CBC
HCT: 44.5 % (ref 39.0–52.0)
Hemoglobin: 13.8 g/dL (ref 13.0–17.0)
MCH: 26.9 pg (ref 26.0–34.0)
MCHC: 31 g/dL (ref 30.0–36.0)
MCV: 86.7 fL (ref 80.0–100.0)
Platelets: 342 10*3/uL (ref 150–400)
RBC: 5.13 MIL/uL (ref 4.22–5.81)
RDW: 13.1 % (ref 11.5–15.5)
WBC: 7 10*3/uL (ref 4.0–10.5)
nRBC: 0 % (ref 0.0–0.2)

## 2021-06-05 LAB — GLUCOSE, CAPILLARY
Glucose-Capillary: 200 mg/dL — ABNORMAL HIGH (ref 70–99)
Glucose-Capillary: 159 mg/dL — ABNORMAL HIGH (ref 70–99)
Glucose-Capillary: 63 mg/dL — ABNORMAL LOW (ref 70–99)
Glucose-Capillary: 69 mg/dL — ABNORMAL LOW (ref 70–99)
Glucose-Capillary: 91 mg/dL (ref 70–99)
Glucose-Capillary: 125 mg/dL — ABNORMAL HIGH (ref 70–99)

## 2021-06-05 MED ORDER — INSULIN REGULAR HUMAN (CONC) 500 UNIT/ML ~~LOC~~ SOPN: 65.0000 [IU] | PEN_INJECTOR | Freq: Two times a day (BID) | SUBCUTANEOUS | Status: DC

## 2021-06-05 MED ORDER — INSULIN REGULAR HUMAN (CONC) 500 UNIT/ML ~~LOC~~ SOPN
65.0000 [IU] | PEN_INJECTOR | Freq: Three times a day (TID) | SUBCUTANEOUS | Status: DC
  Administered 2021-06-05: 13:00:00 65 [IU] via SUBCUTANEOUS

## 2021-06-05 MED ORDER — INSULIN REGULAR HUMAN (CONC) 500 UNIT/ML ~~LOC~~ SOPN
65.0000 [IU] | PEN_INJECTOR | Freq: Two times a day (BID) | SUBCUTANEOUS | Status: DC
  Administered 2021-06-06 – 2021-06-08 (×5): 65 [IU] via SUBCUTANEOUS

## 2021-06-05 MED ORDER — INSULIN REGULAR HUMAN (CONC) 500 UNIT/ML ~~LOC~~ SOPN
50.0000 [IU] | PEN_INJECTOR | Freq: Once | SUBCUTANEOUS | Status: AC
  Administered 2021-06-05: 20:00:00 50 [IU] via SUBCUTANEOUS
  Filled 2021-06-05: qty 3

## 2021-06-05 MED ORDER — NON FORMULARY: 60.0000 [IU] | Freq: Every day | Status: DC

## 2021-06-05 MED ORDER — ENSURE MAX PROTEIN PO LIQD
11.0000 [oz_av] | Freq: Every day | ORAL | Status: DC
  Administered 2021-06-05 – 2021-06-08 (×4): 11 [oz_av] via ORAL

## 2021-06-05 MED ORDER — INSULIN REGULAR HUMAN (CONC) 500 UNIT/ML ~~LOC~~ SOPN
60.0000 [IU] | PEN_INJECTOR | Freq: Every day | SUBCUTANEOUS | Status: DC
  Administered 2021-06-06 – 2021-06-07 (×2): 60 [IU] via SUBCUTANEOUS
  Filled 2021-06-05: qty 3

## 2021-06-05 MED ORDER — INSULIN REGULAR HUMAN (CONC) 500 UNIT/ML ~~LOC~~ SOPN
65.0000 [IU] | PEN_INJECTOR | Freq: Two times a day (BID) | SUBCUTANEOUS | Status: DC
  Filled 2021-06-05: qty 3

## 2021-06-05 NOTE — Progress Notes (Signed)
PROGRESS NOTE   Subjective/Complaints:  Pt reports CBGs got as low as 50 overnight, even after received juice, graham crackers, etc- from 61- finally up to 70 then up to 200 after a LOT of carbs.  Tailbone sore from so much sitting- but no pain in general.  LBM this AM- peeing well.   ROS: Pt denies SOB, abd pain, CP, N/V/C/D, and vision changes   Objective:   No results found. Recent Labs    06/05/21 0633  WBC 7.0  HGB 13.8  HCT 44.5  PLT 342   Recent Labs    06/05/21 0633  NA 136  K 4.0  CL 104  CO2 24  GLUCOSE 244*  BUN 13  CREATININE 1.00  CALCIUM 10.3    Intake/Output Summary (Last 24 hours) at 06/05/2021 0539 Last data filed at 06/05/2021 0750 Gross per 24 hour  Intake 1436 ml  Output --  Net 1436 ml        Physical Exam: Vital Signs Blood pressure 133/77, pulse 80, temperature 98.1 F (36.7 C), resp. rate 14, height 6\' 7"  (2.007 m), weight (!) 156 kg, SpO2 99 %.   General: awake, alert, appropriate, sitting up in bedside chair; appears comfortable; NAD HENT: conjugate gaze; oropharynx moist CV: regular rate; no JVD Pulmonary: CTA B/L; no W/R/R- good air movement GI: soft, NT, ND, (+)BS- normoactive Psychiatric: appropriate; interactive Neurological: Ox3  Neuro:  Alert and oriented x 3. Normal insight and awareness. Intact Memory. Normal language and speech. Cranial nerve exam unremarkable. UE 5/5 . LE: 4-/5 prox to 4/5 distally. Stocking glove sensory loss to ankles bilaterally. DTR's 1+ Musculoskeletal: normal AROM and PROM. No back pain even with SLR's    Assessment/Plan: 1. Functional deficits which require 3+ hours per day of interdisciplinary therapy in a comprehensive inpatient rehab setting. Physiatrist is providing close team supervision and 24 hour management of active medical problems listed below. Physiatrist and rehab team continue to assess barriers to discharge/monitor  patient progress toward functional and medical goals  Care Tool:  Bathing    Body parts bathed by patient: Right arm, Left arm, Chest, Abdomen, Left upper leg, Right lower leg, Left lower leg, Face, Front perineal area, Right upper leg, Buttocks   Body parts bathed by helper: Buttocks     Bathing assist Assist Level: Supervision/Verbal cueing     Upper Body Dressing/Undressing Upper body dressing   What is the patient wearing?: Pull over shirt    Upper body assist Assist Level: Set up assist    Lower Body Dressing/Undressing Lower body dressing      What is the patient wearing?: Pants, Underwear/pull up     Lower body assist Assist for lower body dressing: Supervision/Verbal cueing     Toileting Toileting    Toileting assist Assist for toileting: Contact Guard/Touching assist Assistive Device Comment: urinal   Transfers Chair/bed transfer  Transfers assist     Chair/bed transfer assist level: Contact Guard/Touching assist     Locomotion Ambulation   Ambulation assist      Assist level: Minimal Assistance - Patient > 75% Assistive device: Walker-rolling Max distance: 150'   Walk 10 feet activity   Assist  Assist level: Minimal Assistance - Patient > 75% Assistive device: Walker-rolling   Walk 50 feet activity   Assist Walk 50 feet with 2 turns activity did not occur: Safety/medical concerns  Assist level: Minimal Assistance - Patient > 75% Assistive device: Walker-rolling    Walk 150 feet activity   Assist Walk 150 feet activity did not occur: Safety/medical concerns  Assist level: Minimal Assistance - Patient > 75% Assistive device: Walker-rolling    Walk 10 feet on uneven surface  activity   Assist Walk 10 feet on uneven surfaces activity did not occur: Safety/medical concerns         Wheelchair     Assist   Type of Wheelchair: Manual    Wheelchair assist level: Supervision/Verbal cueing Max wheelchair  distance: 150    Wheelchair 50 feet with 2 turns activity    Assist        Assist Level: Supervision/Verbal cueing   Wheelchair 150 feet activity     Assist      Assist Level: Supervision/Verbal cueing   Blood pressure 133/77, pulse 80, temperature 98.1 F (36.7 C), resp. rate 14, height 6\' 7"  (2.007 m), weight (!) 156 kg, SpO2 99 %.  Medical Problem List and Plan: 1. Functional deficits secondary to diabetic peripheral neuropathy and severe lumbar spinal stenosis.             -patient may shower             -ELOS/Goals: 1 week             12/19- con't CIR_ PT and OT 2.  Impaired mobility, ambulating 20 feet: continue Lovenox.  3. Pain Management: N/A 10. Bipolar disorder w/ anxiety: Continue Lamictal,  Vaylar and Lithium  --Followed by Dr. 1/20 (Psychiatry in [place]) -pt's mood appropriate thus far  12/19- pt reports bipolar is controlled- con't regimen 5. Neuropsych: This patient is capable of making decisions on his own behalf. 6. Skin/Wound Care: Routine pressure relief measures. 7. Fluids/Electrolytes/Nutrition: Monitor I/O- 8. T2DM: Hgb A1C-11.3 and poorly controlled. Per records out of ozempic X 4 weeks due to national back order.              --Per 12/07 endocrine visit--> on Humulin R-500 95 units am/75 units lunch/85 units supper and was to transition to Mounjaro 5 mg/day. Continue to hold metformin             --monitor BS ac/hs   --SSI has not been used as patient appears brittle.   --blood sugars in 70's prior to admit. Humulin decreased to 80  units TID per recommendation by DM coordinator.   CBG (last 3)  Recent Labs    06/04/21 2157 06/04/21 2224 06/05/21 0538  GLUCAP 61* 70 200*  12/17, low CBG reduce Humulin R to 75 U TID  12/18 low CBG reduce Humulin R to 70U TID 12/19- CBGs down to 50 this AM- decrease insulin to 65 units TID with meals 9. HTN: Monitor BP TID. Continue Norvasc daily  12/19- BP controlled- con't regimen 11. OSA: Resume  CPAP 12.Chronic insomnia: Managed by Hydralazine HS prn? 13.  H/o emphysema: Has on going use of E-ciggs 14. Gastroparesis: Intermittent N/V   *addendum: kub reviewed and only mild/moderate stool, normal pattern -will increase  senna-s BID, dulc supp PRN  12/19- LBM this AM- denies constipation today.   LOS: 4 days A FACE TO FACE EVALUATION WAS PERFORMED  Jerry Wheeler 06/05/2021, 8:32 AM

## 2021-06-05 NOTE — Progress Notes (Addendum)
Physical Therapy Session Note  Patient Details  Name: Jerry Wheeler MRN: [id number] Date of Birth: 1975/09/10  Today's Date: 06/05/2021 PT Individual Time: 0900-1000; 1400-1515 PT Individual Time Calculation (min): 60 min and 75 min  Short Term Goals: Week 1:  PT Short Term Goal 1 (Week 1): STG=LTG due to ELOS  Skilled Therapeutic Interventions/Progress Updates:    Session 1: Pt received seated in recliner in room, agreeable to PT session. No complaints of pain. Sit to stand with close Supervision to CGA to RW during session. Ambulation to/from dayroom (at least 200 ft) with use of RW and CGA for balance. Pt exhibits improved endurance this date with increased distance able to ambulate especially at end of therapy session! Standing alt L/R 6" step-ups with RW and min A for balance, 3 x 10 reps for LE strengthening. Ambulation through obstacle course weaving through cones and stepping over obstacles with RW with CGA to min A for balance. Pt reports significant increase in LE fatigue following obstacle course navigation especially with RLE and feels like RLE may buckle in standing position.Seated endurance task and BUE strengthening with 4# weighted dowel performing volleyball and OH press, 2 x 30 reps to fatigue. Pt reports improvement in LE fatigue following seated rest break and is able to ambulate back to his room at end of session. Pt left seated in recliner in room with needs in reach at end of session.  Session 2: Pt received seated in bed, agreeable to PT session. No complaints of pain. Pt reports urge to use the bathroom. Bed mobility mod I with use of bedrail. Sit to stand with close Supervision to CGA and RW throughout session. Ambulatory transfer into bathroom with RW and CGA. Toilet transfer with Supervision, pt is independent for pericare and clothing management. Ambulation to/from therapy gym with RW and close Supervision to CGA for balance, up to 150 ft. Ascend/descend 8 x 6"  stairs with 2 handrails and CGA for balance,  progression from step-to gait pattern to step-through gait pattern. Trial stair navigation with one handrail (R) with min A, unable to maintain balance without BUE support and use of L handrail as well. Demonstrated lateral navigation with use of R handrail BUE support. Pt able to navigate 4 x 6" stairs with R handrail laterally with min A for balance with some difficulty reaching handrail comfortably due to height. Trial stair navigation with use of SPC for LUE and R handrail, min A needed. Pt reports not feeling comfortable navigating stairs with SPC and handrail, prefers lateral method with R handrail. Sit to stand 3 x 10 reps with no AD and min A needed overall, focus on decreased bracing of LE against chair, anterior weight shift during transfer, and use of ankle strategy to prevent posterior LOB in standing position. Seated BUE strengthening therex with use of green theraband x 10 reps each: horiz add, shoulder flex, shoulder ext, shoulder diagonals L/R, shoulder ER, elbow flex, resisted punches. Pt left seated in recliner in room with needs in reach at end of session.  Therapy Documentation Precautions:  Precautions Precautions: Fall Restrictions Weight Bearing Restrictions: No     Therapy/Group: Individual Therapy   Peter Congo, PT, DPT, CSRS  06/05/2021, 5:04 PM

## 2021-06-05 NOTE — Progress Notes (Signed)
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 His smoking use included cigarettes. He uses smokeless tobacco. He reports that he does not drink alcohol. No history on file for drug use.  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 Education: some Medical sales representative - How often do you need to have someone help you when you read instructions, pamphlets, or other written material from your doctor or pharmacy?: Never Writes: Yes Employment Status: Disabled Date Retired/Disabled/Unemployed: 2014 Public relations account executive Issues:  Denies Guardian/Conservator: N/A   Abuse/Neglect Abuse/Neglect Assessment Can Be Completed: Yes Physical Abuse: Denies Verbal Abuse: Denies Sexual Abuse: Denies Exploitation of patient/patient's resources: Denies Self-Neglect: Denies  Patient response to: Social Isolation - How often do you feel lonely or isolated from those around you?: Never  Emotional Status Pt's affect, behavior and adjustment status: Pt in good spirits at time of visit Recent Psychosocial Issues: Pt admits to depression prior to admission Psychiatric History: Pt receives services through [place] with Dr. Lonia Skinner for med mngmt and counseling. Substance Abuse History: Pt admits he quit smoking cigarettes "a while ago," and now vapes "whenever it is needed." Reports rare etoh use.  Patient / Family Perceptions, Expectations & Goals Pt/Family understanding of illness & functional limitations: Pt and brother have a general understanding of pt care needs Premorbid pt/family roles/activities: Independent Anticipated changes in roles/activities/participation: Assistance with ADLs/IADLs Pt/family expectations/goals: Pt goal is to work on stairs since for when he returns to his apartment, and balance because his legs are weak. Also improve mobility as much as possible.  Community Resources [employer] Scripts: None Premorbid Home Care/DME Agencies: None Transportation available at discharge: Brother-Jerry Wheeler Is the patient able to respond to transportation needs?: Yes In the past 12 months, has lack of transportation kept you from medical appointments or from getting medications?: No In the past 12 months, has lack of transportation kept you from meetings, work, or from getting things needed for daily living?: No Resource referrals recommended: Neuropsychology  Discharge Planning Living Arrangements: Alone, Other relatives Support Systems: Other relatives Type of Residence: Private residence Insurance  Resources: [employer] Resources: [employer]  Financial Screen Referred: No Living Expenses: Rent Money Management: Patient Does the patient have any problems obtaining your medications?: No Home Management: Pt managed all homecare needs Patient/Family Preliminary Plans: TBD Care Coordinator Barriers to Discharge: Decreased caregiver support, Lack of/limited family support Care Coordinator Anticipated Follow Up Needs: HH/OP Expected length of stay: 7-10 days  Clinical Impression SW met with pt in room to introduce self, explain role, and discuss discharge process. Pt is not a English as a second language teacher. No HCPOA. No DME. Pt aware SW to follow-up with his brother.   [id number] spoke with [name] to introduce self, explain role, discuss discharge process, ELOS being 7-10 days, and will f/u after team conference tomorrow with updates and determine if fam edu is needed. His brother reports he will be available if needed.   Jerry Wheeler A Jerry Wheeler 06/05/2021, 4:09 PM

## 2021-06-05 NOTE — Progress Notes (Signed)
Occupational Therapy Session Note  Patient Details  Name: Jerry Wheeler MRN: [id number] Date of Birth: 1976-03-08  Today's Date: 06/05/2021 OT Individual Time: 5945-8592 OT Individual Time Calculation (min): 55 min    Short Term Goals: Week 1:  OT Short Term Goal 1 (Week 1): STG=LTG   Skilled Therapeutic Interventions/Progress Updates:    Pt greeted at time of session sitting up in recliner agreeable to OT session, having questions about schedule for the day. Provided pt with therapy schedule for the day to plan ahead, also relayed to nursing that pt requesting morning medications. Pt wanting to take a shower, ambulated recliner > bathroom with RW and Supervision/CGA and posterior entry to shower same manner. Doffed clothing seated and sit > stand level before seated on bench, performed UB/LB bathing with Supervision/CGA. Pt able to use figure four for washing feet and later on for socks/shoes. Educated on AE for reacher and LHS for future use. Also discussed use of TTB at home as he will be Dc'ing to brothers home with tub (and his private residence also has a tub). Dried off seated before walking to bed level, UB dress set up and LB dress Supervision/CGA for sit > stand. Ambulated bed > sink for oral hygiene same manner and performed standing. Remainder of session on UB strengthening with 4# dowel in standing for 1x10 bicep curl, chest press, overhead press, and FWD circles with 1 LOB requiring pt to sit but otherwise able to remain standing throughout. Pt up in chair with alarm in tact call bell in reach and all needs met.    Therapy Documentation Precautions:  Precautions Precautions: Fall Restrictions Weight Bearing Restrictions: No    Therapy/Group: Individual Therapy  Viona Gilmore 06/05/2021, 7:16 AM

## 2021-06-05 NOTE — Care Management (Signed)
[facility]patient Rehabilitation [facility] Individual Statement of Services  Patient Name:  Jerry Wheeler  Date:  06/05/2021  Welcome to the Inpatient Rehabilitation Center.  Our goal is to provide you with an individualized program based on your diagnosis and situation, designed to meet your specific needs.  With this comprehensive rehabilitation program, you will be expected to participate in at least 3 hours of rehabilitation therapies Monday-Friday, with modified therapy programming on the weekends.  Your rehabilitation program will include the following services:  Physical Therapy (PT), Occupational Therapy (OT), 24 hour per day rehabilitation nursing, Therapeutic Recreaction (TR), Psychology, Neuropsychology, Care Coordinator, Rehabilitation Medicine, Nutrition Services, Pharmacy Services, and Other  Weekly team conferences will be held on Tuesdays to discuss your progress.  Your Inpatient Rehabilitation Care Coordinator will talk with you frequently to get your input and to update you on team discussions.  Team conferences with you and your family in attendance may also be held.  Expected length of stay: 7-10 days    Overall anticipated outcome: Independent with an Assistive Device  Depending on your progress and recovery, your program may change. Your Inpatient Rehabilitation Care Coordinator will coordinate services and will keep you informed of any changes. Your Inpatient Rehabilitation Care Coordinator's name and contact numbers are listed  below.  The following services may also be recommended but are not provided by the Inpatient Rehabilitation Center:  Driving Evaluations Home Health Rehabiltiation Services Outpatient Rehabilitation Services Vocational Rehabilitation   Arrangements will be made to provide these services after discharge if needed.  Arrangements include referral to agencies that provide these services.  Your insurance has been verified to be:  Medicare A/B  Your  primary doctor is:  Marisue Ivan  Pertinent information will be shared with your doctor and your insurance company.  Inpatient Rehabilitation Care Coordinator:  Susie Cassette [phone] or [phone]  Information discussed with and copy given to patient by: Gretchen Short, 06/05/2021, 9:35 AM

## 2021-06-05 NOTE — Progress Notes (Addendum)
Patient BS dropped to 63 but he received 70 units with breakfast and 65 units with lunch. Will decrease lunch time dose to 60 units as tends to drop in evenings and decrease dose today to 50 units to allow to catch up.   Elevation BS this am from all boost, ensure and all other snacks  given with BS 50 last pm.

## 2021-06-05 NOTE — Progress Notes (Signed)
Hypoglycemic Event  CBG: 63  Treatment: 4 oz juice/soda  Symptoms: None  Follow-up CBG Time: 1800 CBG Result: 91  Possible Reasons for Event: Unknown  Comments/MD notified:  Delle Reining, PA    Merrilyn Puma, RN

## 2021-06-06 LAB — GLUCOSE, CAPILLARY
Glucose-Capillary: 266 mg/dL — ABNORMAL HIGH (ref 70–99)
Glucose-Capillary: 266 mg/dL — ABNORMAL HIGH (ref 70–99)
Glucose-Capillary: 94 mg/dL (ref 70–99)
Glucose-Capillary: 71 mg/dL (ref 70–99)

## 2021-06-06 NOTE — Progress Notes (Signed)
PROGRESS NOTE   Subjective/Complaints:  Pt reports only had a drop in CBG yesterday afternoon; not last night.    Tailbone still sore, but no other pain.  Ate 100% meal.  Rinaldo Cloud changed his insulin to 65 units BID and 60 units at lunch.    ROS:  Pt denies SOB, abd pain, CP, N/V/C/D, and vision changes   Objective:   No results found. Recent Labs    06/05/21 0633  WBC 7.0  HGB 13.8  HCT 44.5  PLT 342   Recent Labs    06/05/21 0633  NA 136  K 4.0  CL 104  CO2 24  GLUCOSE 244*  BUN 13  CREATININE 1.00  CALCIUM 10.3    Intake/Output Summary (Last 24 hours) at 06/06/2021 0844 Last data filed at 06/06/2021 0740 Gross per 24 hour  Intake 720 ml  Output --  Net 720 ml        Physical Exam: Vital Signs Blood pressure 130/78, pulse 74, temperature 98.1 F (36.7 C), temperature source Oral, resp. rate 14, height 6\' 7"  (2.007 m), weight (!) 156 kg, SpO2 96 %.    General: awake, alert, appropriate, sitting up in bedside chair; NAD HENT: conjugate gaze; oropharynx moist CV: regular rate; no JVD Pulmonary: CTA B/L; no W/R/R- good air movement GI: soft, NT, ND, (+)BS Psychiatric: appropriate but flat affect Neurological: alert Neuro:  Alert and oriented x 3. Normal insight and awareness. Intact Memory. Normal language and speech. Cranial nerve exam unremarkable. UE 5/5 . LE: 4-/5 prox to 4/5 distally. Stocking glove sensory loss to ankles bilaterally. DTR's 1+ Musculoskeletal: normal AROM and PROM. No back pain even with SLR's    Assessment/Plan: 1. Functional deficits which require 3+ hours per day of interdisciplinary therapy in a comprehensive inpatient rehab setting. Physiatrist is providing close team supervision and 24 hour management of active medical problems listed below. Physiatrist and rehab team continue to assess barriers to discharge/monitor patient progress toward functional and medical  goals  Care Tool:  Bathing    Body parts bathed by patient: Right arm, Left arm, Chest, Abdomen, Left upper leg, Right lower leg, Left lower leg, Face, Front perineal area, Right upper leg, Buttocks   Body parts bathed by helper: Buttocks     Bathing assist Assist Level: Supervision/Verbal cueing     Upper Body Dressing/Undressing Upper body dressing   What is the patient wearing?: Pull over shirt    Upper body assist Assist Level: Set up assist    Lower Body Dressing/Undressing Lower body dressing      What is the patient wearing?: Pants, Underwear/pull up     Lower body assist Assist for lower body dressing: Supervision/Verbal cueing     Toileting Toileting    Toileting assist Assist for toileting: Supervision/Verbal cueing Assistive Device Comment: urinal   Transfers Chair/bed transfer  Transfers assist     Chair/bed transfer assist level: Contact Guard/Touching assist     Locomotion Ambulation   Ambulation assist      Assist level: Contact Guard/Touching assist Assistive device: Walker-rolling Max distance: 200'   Walk 10 feet activity   Assist     Assist level: Contact Guard/Touching  assist Assistive device: Walker-rolling   Walk 50 feet activity   Assist Walk 50 feet with 2 turns activity did not occur: Safety/medical concerns  Assist level: Contact Guard/Touching assist Assistive device: Walker-rolling    Walk 150 feet activity   Assist Walk 150 feet activity did not occur: Safety/medical concerns  Assist level: Contact Guard/Touching assist Assistive device: Walker-rolling    Walk 10 feet on uneven surface  activity   Assist Walk 10 feet on uneven surfaces activity did not occur: Safety/medical concerns         Wheelchair     Assist Is the patient using a wheelchair?: Yes Type of Wheelchair: Manual    Wheelchair assist level: Supervision/Verbal cueing Max wheelchair distance: 150    Wheelchair 50 feet  with 2 turns activity    Assist        Assist Level: Supervision/Verbal cueing   Wheelchair 150 feet activity     Assist      Assist Level: Supervision/Verbal cueing   Blood pressure 130/78, pulse 74, temperature 98.1 F (36.7 C), temperature source Oral, resp. rate 14, height 6\' 7"  (2.007 m), weight (!) 156 kg, SpO2 96 %.  Medical Problem List and Plan: 1. Functional deficits secondary to diabetic peripheral neuropathy and severe lumbar spinal stenosis.             -patient may shower             -ELOS/Goals: 1 week             12/20- con't CIR_ PT and OT- team conference today to determine length of stay.  2.  Impaired mobility, ambulating 20 feet: continue Lovenox.  3. Pain Management: N/A 10. Bipolar disorder w/ anxiety: Continue Lamictal,  Vaylar and Lithium  --Followed by Dr. 1/21 (Psychiatry in [place]) -pt's mood appropriate thus far 12/20- bipolar doing well- slightly flat- con't regimen 5. Neuropsych: This patient is capable of making decisions on his own behalf. 6. Skin/Wound Care: Routine pressure relief measures. 7. Fluids/Electrolytes/Nutrition: Monitor I/O- 8. T2DM: Hgb A1C-11.3 and poorly controlled. Per records out of ozempic X 4 weeks due to national back order.              --Per 12/07 endocrine visit--> on Humulin R-500 95 units am/75 units lunch/85 units supper and was to transition to Mounjaro 5 mg/day. Continue to hold metformin             --monitor BS ac/hs   --SSI has not been used as patient appears brittle.   --blood sugars in 70's prior to admit. Humulin decreased to 80  units TID per recommendation by DM coordinator.   CBG (last 3)  Recent Labs    06/05/21 1800 06/05/21 2103 06/06/21 0653  GLUCAP 91 125* 266*   12/19- CBGs down to 50 this AM- decrease insulin to 65 units TID with meals 12/20- got 50 units last night, but on 65 units BID and 60 units with lunch. Will monitor today.  9. HTN: Monitor BP TID. Continue Norvasc  daily  12/19- BP controlled- con't regimen 11. OSA: Resume CPAP 12.Chronic insomnia: Managed by Hydralazine HS prn?  12/20- sleeping well per pt- con't regimen 13.  H/o emphysema: Has on going use of E-ciggs 14. Gastroparesis: Intermittent N/V   *addendum: kub reviewed and only mild/moderate stool, normal pattern -will increase  senna-s BID, dulc supp PRN  12/19- LBM this AM- denies constipation today.   LOS: 5 days A FACE TO FACE EVALUATION WAS PERFORMED  Skylynn Burkley 06/06/2021, 8:44 AM

## 2021-06-06 NOTE — Progress Notes (Signed)
Occupational Therapy Session Note  Patient Details  Name: Jerry Wheeler MRN: [id number] Date of Birth: [birth year]  Today's Date: 06/06/2021 OT Individual Time: 1430-1530 OT Individual Time Calculation (min): 60 min    Short Term Goals: Week 1:  OT Short Term Goal 1 (Week 1): STG=LTG   Skilled Therapeutic Interventions/Progress Updates:    Pt sitting EOB, reporting he would like to wait to shower until tomorrow, and requesting to work on strength and balance.  Pt ambulated using RW with close supervision approximately 150 feet to large gym.  Pt participated in dynamic standing balance activities without and with RW reaching outside BOS in multidirection and planes and multimodal cues needed to shift weight anteriorly as well as widen foot stance for increase BOS.  Pt needed min assist intermittently to correct posterior lean during standing balance without AD.  Pt requiring frequent seated rest breaks during dynamic balance training reporting BLE fatigue.  Pt had one LOB requiring mod assist to slow descent into seated position on EOM.  Ambulated back to room using RW with close supervision.  Reviewed HEP BUE strengthening with heavy resistive band and pt required mod intermittent cues to correct positioning and sequencing of movement.  Returned to supine with mod I. Call bell in reach, bed alarm on.  Therapy Documentation Precautions:  Precautions Precautions: Fall Restrictions Weight Bearing Restrictions: No    Therapy/Group: Individual Therapy  Amie Critchley 06/06/2021, 3:31 PM

## 2021-06-06 NOTE — Progress Notes (Signed)
Patient ID: Jerry Wheeler, male   DOB: 02-Oct-1975, 45 y.o.   MRN: [id number]  SW made several efforts to meet with pt to provide updates from team conference, but pt not in room.  SW spoke with [name] to provide updates from team conference, and d/c date 12/23. SW informed on d/c recs being a RW and Outpatient PT/OT. Preferred outpatient location is [facility].   SW ordered RW with [employer] via parachute.   *SW spoke with pt discussing above. Pt encouraged to follow-up with [employer] to make payment for DME.   Cecile Sheerer, MSW, LCSWA Office: [phone] Cell: [phone] Fax: [phone]

## 2021-06-06 NOTE — Progress Notes (Addendum)
Physical Therapy Session Note  Patient Details  Name: Jerry Wheeler MRN: [id number] Date of Birth: 11/22/1975  Today's Date: 06/06/2021 PT Individual Time: 0900-1000; 1300-1345 PT Individual Time Calculation (min): 60 min and 45 min  Short Term Goals: Week 1:  PT Short Term Goal 1 (Week 1): STG=LTG due to ELOS  Skilled Therapeutic Interventions/Progress Updates:    Session 1: Pt received seated in recliner in room, agreeable to PT session. No complaints of pain. Sit to stand with close Supervision to RW during session. Ambulation up to 150 ft during session with RW and close Supervision for balance. Sit to/from supine on real bed in therapy apartment at mod I level with increased time needed to complete transfer safely. Sit to stand with CGA from low, pliable surface of couch to RW. Pt reports he has no low furniture at home or at his brother's house. Ascend/descend 8 x 6" stairs with 1 handrail and min A simulating home environment, x 3 reps. Static standing balance with no UE support and min to mod A performing ball toss against rebounder, focus on use of ankle and hip strategy to prevent posterior LOB. Pt able to perform up to 5 repetitions before onset of LOB and needing to sit on mat table to safety. Sidesteps L/R 2 x 10 ft with RW and CGA for balance with focus on hip strengthening and LE clearance. Forwards/backwards ambulation with RW 4 x 10 ft with min A for balance to prevent posterior LOB. Pt left seated in recliner in room with needs in reach at end of session.  Session 2: Pt received seated in recliner in room, agreeable to PT session. No complaints of pain, does have some RLE quad and glute muscle soreness from activity. Sit to stand with Supervision to RW during session. Ambulation x 500 ft, 2 x 300 ft with RW at Supervision level. Pt continues to exhibit improved endurance with gait. Standing squats with RW and CGA for balance 3 x 10 reps for LE strengthening. Standing alt L/R  cone taps 3 x 10 reps with RW and CGA for balance, decreased control of RLE as compared to LLE noted. Pt left seated in recliner in room with needs in reach at end of session.  Therapy Documentation Precautions:  Precautions Precautions: Fall Restrictions Weight Bearing Restrictions: No     Therapy/Group: Individual Therapy   Peter Congo, PT, DPT, CSRS  06/06/2021, 12:22 PM

## 2021-06-06 NOTE — Patient Care Conference (Signed)
Inpatient RehabilitationTeam Conference and Plan of Care Update Date: 06/06/2021   Time: 11:02 AM     Patient Name: Jerry Wheeler      Medical Record Number: [id number]  Date of Birth: 1976-05-06 Sex: Male         Room/Bed: 4M09C/4M09C-01 Payor Info: Payor: MEDICARE / Plan: MEDICARE PART A AND B / Product Type: *No Product type* /    Admit Date/Time:  06/01/2021  3:24 PM  Primary Diagnosis:  Lumbar disc disorder with myelopathy  Hospital Problems: Principal Problem:   Lumbar disc disorder with myelopathy    Expected Discharge Date: Expected Discharge Date: 06/09/21  Team Members Present: Physician leading conference: Dr. Genice Rouge Social Worker Present: Cecile Sheerer, LCSWA Nurse Present: Kennyth Arnold, RN PT Present: Peter Congo, PT OT Present: Earleen Newport, OT PPS Coordinator present : Fae Pippin, SLP     Current Status/Progress Goal Weekly Team Focus  Bowel/Bladder   Pt is continent of bowel and bladder. Last BM 06/05/2021  continue toileting  remain continent   Swallow/Nutrition/ Hydration             ADL's   shower level bathing Supervision, Supervision/CGA all ADL transfers, has occasional posterior LOB, shaking/tremors?  Mod I  standing balance/tolerance, UB strength, global endurance, conditioning, ADL retraining   Mobility   Independent bed mobility, close Supervision to CGA sit to stand and transfers, gait close Supervision to CGA with RW, min A stairs  mod I transfers and gait, Supervision stairs  balance, endurance, strengthening, stairs   Communication             Safety/Cognition/ Behavioral Observations            Pain   Pt denies any pain  remain free of pain  assess pain q 4hr and PRN   Skin   cdi  no new breakdown  assess skin q shift and prn     Discharge Planning:  Pt to d/c to home with his [name] who works from home. Pt will need to be intermittent support at time of dischare.   Team Discussion: History of  bipolar disorder. Blood sugars have been up and down, A1c 10.3. titrating insulin down due to poor intake. Reports muscle soreness. Discharging home with brother. Contact guard to supervision with ambulation currently. Supervision to and from bathroom.  Patient on target to meet rehab goals: yes, mod I overall.  *See Care Plan and progress notes for long and short-term goals.   Revisions to Treatment Plan:  Adjusting medications and finalizing discharge plans.  Teaching Needs: Family education, medication management, safety awareness, transfer/gait training, etc.  Current Barriers to Discharge: Decreased caregiver support, Home enviroment access/layout, New diabetic, Lack of/limited family support, Weight, Weight bearing restrictions, Medication compliance, and Nutritional means  Possible Resolutions to Barriers: Family education Follow-up PT/OT Order appropriate DME Diabetes education Insulin management     Medical Summary Current Status: muscle soreness from working out; continent B/B; skin good; CBGs labile- low due to eating less BMI 38; lumbar myelopathy-  Barriers to Discharge: Decreased family/caregiver support;Home enviroment access/layout;Medical stability;Medication compliance;Nutrition means;Weight;New diabetic  Barriers to Discharge Comments: ADLs supervision- goals mod I; going home wiht brother- Possible Resolutions to [employer] Focus: A1c 11.3- will titrate insulin and titrate up when goes back- d/c Friday 12/23   Continued Need for Acute Rehabilitation Level of Care: The patient requires daily medical management by a physician with specialized training in physical medicine and rehabilitation for the following reasons: Direction of a  multidisciplinary physical rehabilitation program to maximize functional independence : Yes Medical management of patient stability for increased activity during participation in an intensive rehabilitation regime.: Yes Analysis of  laboratory values and/or radiology reports with any subsequent need for medication adjustment and/or medical intervention. : Yes   I attest that I was present, lead the team conference, and concur with the assessment and plan of the team.   Tennis Must 06/06/2021, 3:35 PM

## 2021-06-06 NOTE — Progress Notes (Signed)
Patient ID: Jerry Wheeler, male   DOB: 12-19-1975, 45 y.o.   MRN: [id number]  SW met with pt in room to review discharge including outpatient PT/OT at [facility]. He confirms he spoke with [employer] about delivery of item.   *SW faxed outpatient PT/OT to [facility] (p:[phone]/[phone]).  Loralee Pacas, MSW, [place] Office: [phone] Cell: [phone] Fax: [phone]

## 2021-06-06 NOTE — Progress Notes (Signed)
Occupational Therapy Session Note  Patient Details  Name: Jerry Wheeler MRN: [id number] Date of Birth: [birth year]  Today's Date: 06/06/2021 OT Individual Time: 0370-9643 OT Individual Time Calculation (min): 39 min    Short Term Goals: Week 1:  OT Short Term Goal 1 (Week 1): STG=LTG   Skilled Therapeutic Interventions/Progress Updates:    Pt greeted at time of session sitting up in recliner agreeable to OT session, made aware of schedule change for today. No pain reported. RN present at beginning of session for med pass including insulin. Pt ambulating recliner > bathroom Supervision with RW and completing all toileting tasks same manner. Pt educated that in case RW does not fit through bathroom door at home, can side step and demonstrated. Pt ambulating bathroom > sink > wheelchair with Supervision and performed oral hygiene in same manner standing. After brief rest break, pt ambulated room <> ortho gym with RW and Supervision/CGA with wheelchair follow but did not need a rest break. Focused on standing balance with mirror for feedback to promote symmetry with 4# dowel for the following: 2x10-15 reps bicep curl, chest press, overhead press first round seated and second round in standing with 2LOB posteriorly with pt attempting to correct but eventually needing to sit, difficulty controlling. Pt did have 1x able to recover balance with cues for anterior weight shift. Back in room set up in recliner call bell in reach all needs met.   Therapy Documentation Precautions:  Precautions Precautions: Fall Restrictions Weight Bearing Restrictions: No     Therapy/Group: Individual Therapy  Viona Gilmore 06/06/2021, 7:17 AM

## 2021-06-06 NOTE — Progress Notes (Signed)
PT stated he could place self on cpap when ready for bed.

## 2021-06-07 LAB — GLUCOSE, CAPILLARY
Glucose-Capillary: 99 mg/dL (ref 70–99)
Glucose-Capillary: 198 mg/dL — ABNORMAL HIGH (ref 70–99)
Glucose-Capillary: 119 mg/dL — ABNORMAL HIGH (ref 70–99)
Glucose-Capillary: 50 mg/dL — ABNORMAL LOW (ref 70–99)
Glucose-Capillary: 141 mg/dL — ABNORMAL HIGH (ref 70–99)

## 2021-06-07 NOTE — Progress Notes (Signed)
PROGRESS NOTE   Subjective/Complaints:  Pt reports mild low CBGs yesterday- Bgs 71-99 since dinner- was elevated yesterday AM to compensate, it appears- will monitor No pain- doing well- getting shower  ROS:  Pt denies SOB, abd pain, CP, N/V/C/D, and vision changes    Objective:   No results found. Recent Labs    06/05/21 0633  WBC 7.0  HGB 13.8  HCT 44.5  PLT 342   Recent Labs    06/05/21 0633  NA 136  K 4.0  CL 104  CO2 24  GLUCOSE 244*  BUN 13  CREATININE 1.00  CALCIUM 10.3    Intake/Output Summary (Last 24 hours) at 06/07/2021 0820 Last data filed at 06/06/2021 1800 Gross per 24 hour  Intake 480 ml  Output --  Net 480 ml        Physical Exam: Vital Signs Blood pressure 136/87, pulse 76, temperature 97.7 F (36.5 C), temperature source Oral, resp. rate 18, height 6\' 7"  (2.007 m), weight (!) 156 kg, SpO2 98 %.    General: awake, alert, appropriate, in shower- sitting with OTA; BMI 38;  NAD HENT: conjugate gaze; oropharynx moist CV: regular rate; no JVD Pulmonary: CTA B/L; no W/R/R- good air movement GI: soft, NT, ND, (+)BS Psychiatric: appropriate; flat affect Neurological: Ox3  Neuro:  Alert and oriented x 3. Normal insight and awareness. Intact Memory. Normal language and speech. Cranial nerve exam unremarkable. UE 5/5 . LE: 4-/5 prox to 4/5 distally. Stocking glove sensory loss to ankles bilaterally. DTR's 1+ Musculoskeletal: normal AROM and PROM. No back pain even with SLR's    Assessment/Plan: 1. Functional deficits which require 3+ hours per day of interdisciplinary therapy in a comprehensive inpatient rehab setting. Physiatrist is providing close team supervision and 24 hour management of active medical problems listed below. Physiatrist and rehab team continue to assess barriers to discharge/monitor patient progress toward functional and medical goals  Care Tool:  Bathing     Body parts bathed by patient: Right arm, Left arm, Chest, Abdomen, Left upper leg, Right lower leg, Left lower leg, Face, Front perineal area, Right upper leg, Buttocks   Body parts bathed by helper: Buttocks     Bathing assist Assist Level: Supervision/Verbal cueing     Upper Body Dressing/Undressing Upper body dressing   What is the patient wearing?: Pull over shirt    Upper body assist Assist Level: Independent    Lower Body Dressing/Undressing Lower body dressing      What is the patient wearing?: Pants, Underwear/pull up     Lower body assist Assist for lower body dressing: Supervision/Verbal cueing     Toileting Toileting    Toileting assist Assist for toileting: Supervision/Verbal cueing Assistive Device Comment: urinal   Transfers Chair/bed transfer  Transfers assist     Chair/bed transfer assist level: Contact Guard/Touching assist     Locomotion Ambulation   Ambulation assist      Assist level: Supervision/Verbal cueing Assistive device: Walker-rolling Max distance: 150'   Walk 10 feet activity   Assist     Assist level: Supervision/Verbal cueing Assistive device: Walker-rolling   Walk 50 feet activity   Assist Walk 50 feet with  2 turns activity did not occur: Safety/medical concerns  Assist level: Supervision/Verbal cueing Assistive device: Walker-rolling    Walk 150 feet activity   Assist Walk 150 feet activity did not occur: Safety/medical concerns  Assist level: Supervision/Verbal cueing Assistive device: Walker-rolling    Walk 10 feet on uneven surface  activity   Assist Walk 10 feet on uneven surfaces activity did not occur: Safety/medical concerns         Wheelchair     Assist Is the patient using a wheelchair?: Yes Type of Wheelchair: Manual    Wheelchair assist level: Supervision/Verbal cueing Max wheelchair distance: 150    Wheelchair 50 feet with 2 turns activity    Assist         Assist Level: Supervision/Verbal cueing   Wheelchair 150 feet activity     Assist      Assist Level: Supervision/Verbal cueing   Blood pressure 136/87, pulse 76, temperature 97.7 F (36.5 C), temperature source Oral, resp. rate 18, height 6\' 7"  (2.007 m), weight (!) 156 kg, SpO2 98 %.  Medical Problem List and Plan: 1. Functional deficits secondary to diabetic peripheral neuropathy and severe lumbar spinal stenosis.             -patient may shower             -ELOS/Goals: 1 week             12/21- d/c date 12/23- con't CIR_ PT and OT 2.  Impaired mobility, ambulating 20 feet: continue Lovenox.  3. Pain Management: N/A 10. Bipolar disorder w/ anxiety: Continue Lamictal,  Vaylar and Lithium  --Followed by Dr. 1/24 (Psychiatry in [place]) -pt's mood appropriate thus far 12/20- bipolar doing well- slightly flat- con't regimen 5. Neuropsych: This patient is capable of making decisions on his own behalf. 6. Skin/Wound Care: Routine pressure relief measures. 7. Fluids/Electrolytes/Nutrition: Monitor I/O- 8. T2DM: Hgb A1C-11.3 and poorly controlled. Per records out of ozempic X 4 weeks due to national back order.              --Per 12/07 endocrine visit--> on Humulin R-500 95 units am/75 units lunch/85 units supper and was to transition to Mounjaro 5 mg/day. Continue to hold metformin             --monitor BS ac/hs   --SSI has not been used as patient appears brittle.   --blood sugars in 70's prior to admit. Humulin decreased to 80  units TID per recommendation by DM coordinator.   CBG (last 3)  Recent Labs    06/06/21 1651 06/06/21 2106 06/07/21 0616  GLUCAP 94 71 99   12/19- CBGs down to 50 this AM- decrease insulin to 65 units TID with meals 12/20- got 50 units last night, but on 65 units BID and 60 units with lunch. Will monitor today. 12/21- CBGs 71-99 since last evening- will give 1 more day before change Insulin.   9. HTN: Monitor BP TID. Continue Norvasc  daily  12/21- BP controlled- con't regimen 11. OSA: Resume CPAP 12.Chronic insomnia: Managed by Hydralazine HS prn?  12/20- sleeping well per pt- con't regimen 13.  H/o emphysema: Has on going use of E-ciggs 14. Gastroparesis: Intermittent N/V   *addendum: kub reviewed and only mild/moderate stool, normal pattern -will increase  senna-s BID, dulc supp PRN  12/19- LBM this AM- denies constipation today.   LOS: 6 days A FACE TO FACE EVALUATION WAS PERFORMED  Nykolas Bacallao 06/07/2021, 8:20 AM

## 2021-06-07 NOTE — Progress Notes (Signed)
Occupational Therapy Session Note  Patient Details  Name: Jasen Hartstein MRN: [id number] Date of Birth: 12-21-1975  Today's Date: 06/07/2021 OT Individual Time: 0700-0810 OT Individual Time Calculation (min): 70 min    Short Term Goals: Week 1:  OT Short Term Goal 1 (Week 1): STG=LTG  Skilled Therapeutic Interventions/Progress Updates:    Pt resting in recliner upon arrival. Initial OT intervention with focus on BADLs. Pt amb with RW to bathroom and used toilet prior to transfer to shower. After completing shower, pt amb with RW to EOB to complete dressing. Pt completed all tasks at supervision level. No LOB noted. Pt amb with RW to sink and stood at sink to brush teeth. Following rest break, pt amb with RW to gym. Standing activity tossing ball at rebounder. Several LOB noted during activity. 3x10 with rest breaks. Pt verbalized fear of falling. Pt also commented that his legs still feel weak. Pt amb with RW to ortho gym for BITS activities. Pt completed 3 tasks in standing using LUE for support on RW. No LOB noted. Pt returned to room and remained seated in recliner. Seat alarm activated. All needs within reach.   Therapy Documentation Precautions:  Precautions Precautions: Fall Restrictions Weight Bearing Restrictions: No Pain:  Pt denies pain this morning   Therapy/Group: Individual Therapy  Rich Brave 06/07/2021, 8:11 AM

## 2021-06-07 NOTE — Progress Notes (Signed)
Physical Therapy Session Note  Patient Details  Name: Jerry Wheeler MRN: [id number] Date of Birth: [birth year]  Today's Date: 06/07/2021 PT Individual Time: 1306-1420 PT Individual Time Calculation (min): 74 min   Short Term Goals: Week 1:  PT Short Term Goal 1 (Week 1): STG=LTG due to ELOS  Skilled Therapeutic Interventions/Progress Updates: Pt presented in recliner agreeable to therapy. Pt denies pain but does indicate some soreness in BLE and fatigue. Rest breaks provided as needed throughout session. Session focused on higher level balance activities with emphasis on adressing posterior lean. Pt set up with maxi sky and participated in ambulation in [place] without AD x 83f with modA. Pt required cues to increase BOS and improve erect posture. Pt also performed mini steps to target forward and laterally requiring HHA when stepping to L. Pt attempted toe taps to 2in step [facility] able to perform with minA with RLE however significant lean and near LOB when performed with LLE with increased dyspnea noted. Once Maxi sky activities completed pt ambulated to w/c and transferred to day room for energy conservation. Pt participated in Wii bowling for standing tolerance and dynamic balance. Pt was able to complete x 3 frames before requesting to sit due to fatigue. Pt was able to complete game with seated rests as needed (3/frames). Pt transported back to room for time management and performed ambulatory transfer to bed. Pt doffed shoes mod I and performed sit to supine mod I. Pt left in bed at end of session with bed alarm on, call bell within reach and needs met.      Therapy Documentation Precautions:  Precautions Precautions: Fall Restrictions Weight Bearing Restrictions: No    Therapy/Group: Individual Therapy  Jerry Wheeler 06/07/2021, 4:19 PM

## 2021-06-07 NOTE — Progress Notes (Signed)
Physical Therapy Session Note  Patient Details  Name: Jerry Wheeler MRN: [id number] Date of Birth: Aug 30, 1975  Today's Date: 06/07/2021 PT Individual Time: 1000-1100 PT Individual Time Calculation (min): 60 min   Short Term Goals: Week 1:  PT Short Term Goal 1 (Week 1): STG=LTG due to ELOS  Skilled Therapeutic Interventions/Progress Updates:    Pt received seated in recliner in room, agreeable to PT session. No complaints of pain, does report BLE feel "weak" this AM. Pt requesting to use the bathroom. Sit to stand with Supervision and RW during session. Ambulatory transfer to toilet with RW and Supervision. Toilet transfer with Supervision and RW, independent for clothing management and pericare. Ambulation up to 150 ft with RW and Supervision during session. Pt received RW he will d/c home with, adjusted RW for height and adjusted back leg pieces for increased smoothness of movement during gait. Ascend/descend 4 x 6" stairs with 2 handrail and CGA for balance, x 3 reps with seated rest break between each round of stair navigation. Sit to stand 2 x 10 reps with min A while holding volleyball with BUE to prevent UE support with transfer, focus on use of ankle and hip strategy to find balance in standing position. Pt occasionally braces BLE against mat table for support in standing. Pt returned to recliner at end of session, needs in reach. Provided HEP handout for patient at end of session, see below for details.  Access Code: [email or web address] URL: [email or web address] Date: 06/07/2021 Prepared by: Peter Congo  Exercises Mini Squat with Counter Support - 1 x daily - 7 x weekly - 3 sets - 10 reps Side Stepping with Counter Support - 1 x daily - 7 x weekly - 3 sets - 10 reps Forward Step Up with Counter Support - 1 x daily - 7 x weekly - 3 sets - 10 reps Lateral Step Up with Counter Support - 1 x daily - 7 x weekly - 3 sets - 10 reps Standing Toe Taps - 1 x daily - 7 x weekly - 3  sets - 10 reps   Therapy Documentation Precautions:  Precautions Precautions: Fall Restrictions Weight Bearing Restrictions: No     Therapy/Group: Individual Therapy   Peter Congo, PT, DPT, CSRS  06/07/2021, 12:15 PM

## 2021-06-07 NOTE — Progress Notes (Signed)
Physical Therapy Discharge Summary  Patient Details  Name: Kiel Cockerell MRN: [id number] Date of Birth: 1976-03-16  Today's Date: 06/08/2021 PT Individual Time: 1100-1155 PT Individual Time Calculation (min): 55 min    Patient has met 9 of 9 long term goals due to improved activity tolerance, improved balance, improved postural control, increased strength, and ability to compensate for deficits.  Patient to discharge at an ambulatory level Modified Independent.   Patient's care partner is independent to provide the necessary physical assistance at discharge. Pt only requires Supervision level assist for stair navigation and is safe to instruct family on how to assist him with this upon d/c home.  Reasons goals not met: N/A all goals met  Recommendation:  Patient will benefit from ongoing skilled PT services in outpatient setting to continue to advance safe functional mobility, address ongoing impairments in endurance, strength, balance, safety, and minimize fall risk.  Equipment: RW  Reasons for discharge: treatment goals met and discharge from hospital  Patient/family agrees with progress made and goals achieved: Yes  PT Discharge Precautions/Restrictions Precautions Precautions: Fall Restrictions Weight Bearing Restrictions: No Vital Signs Therapy Vitals Temp: 98.7 F (37.1 C) Pulse Rate: 83 Resp: 16 BP: (!) 147/78 Patient Position (if appropriate): Lying Oxygen Therapy SpO2: 98 % O2 Device: Room Air Pain   Pain Interference Pain Interference Pain Effect on Sleep: 1. Rarely or not at all Pain Interference with Therapy Activities: 1. Rarely or not at all Pain Interference with Day-to-Day Activities: 1. Rarely or not at all Vision/Perception  Vision - History Ability to See in Adequate Light: 0 Adequate Perception Perception: Within Functional Limits  Cognition Overall Cognitive Status: Within Functional Limits for tasks assessed Arousal/Alertness:  Awake/alert Orientation Level: Oriented X4 Sensation Sensation Light Touch: Impaired Detail Light Touch Impaired Details: Impaired LLE;Impaired RLE Hot/Cold: Appears Intact Motor  Motor Motor: Other (comment) Motor - Skilled Clinical Observations: mild tremor  Mobility Bed Mobility Bed Mobility: Supine to Sit;Sitting - Scoot to Edge of Bed Supine to Sit: Independent Sitting - Scoot to [employer] of Bed: Independent Transfers Transfers: Stand to Sit;Sit to Stand Sit to Stand: Independent with assistive device Stand to Sit: Independent with assistive device Stand Pivot Transfers: Independent with assistive device Transfer (Assistive device): Rolling walker Locomotion  Gait Ambulation: Yes Gait Assistance: Independent with assistive device Assistive device: Rolling walker Gait Gait: Yes Gait Pattern: Impaired Gait Pattern: Lateral hip instability Gait velocity: decreased Stairs / Additional Locomotion Stairs: Yes Stairs Assistance: Contact Guard/Touching assist Stair Management Technique: One rail Right;One rail Left Number of Stairs: 8 Height of Stairs: 6 Wheelchair Mobility Wheelchair Mobility: No  Trunk/Postural Assessment  Cervical Assessment Cervical Assessment: Within Functional Limits Thoracic Assessment Thoracic Assessment: Exceptions to [facility] Lumbar Assessment Lumbar Assessment: Within Functional Limits Postural Control Postural Control: Deficits on evaluation Trunk Control: Delayed  Balance Balance Balance Assessed: Yes Standardized Balance Assessment Standardized Balance Assessment: Berg Balance Test Berg Balance Test Sit to Stand: Able to stand  independently using hands Standing Unsupported: Unable to stand 30 seconds unassisted Sitting with Back Unsupported but Feet Supported on Floor or Stool: Able to sit safely and securely 2 minutes Stand to Sit: Controls descent by using hands Transfers: Able to transfer safely, definite need of hands Standing  Unsupported with Eyes Closed: Able to stand 3 seconds Standing Ubsupported with Feet Together: Able to place feet together independently and stand for 1 minute with supervision From Standing, Reach Forward with Outstretched Arm: Can reach forward >5 cm safely (2") From Standing Position, Pick  up Object from Floor: Unable to try/needs assist to keep balance From Standing Position, Turn to Look Behind Over each Shoulder: Turn sideways only but maintains balance Turn 360 Degrees: Needs close supervision or verbal cueing Standing Unsupported, Alternately Place Feet on Step/Stool: Able to complete >2 steps/needs minimal assist Standing Unsupported, One Foot in Front: Needs help to step but can hold 15 seconds Standing on One Leg: Unable to try or needs assist to prevent fall Total Score: 25 Static Sitting Balance Static Sitting - Balance Support: Feet supported Static Sitting - Level of Assistance: 7: Independent Dynamic Sitting Balance Dynamic Sitting - Balance Support: During functional activity Dynamic Sitting - Level of Assistance: 6: Modified independent (Device/Increase time) Static Standing Balance Static Standing - Balance Support: Bilateral upper extremity supported Static Standing - Level of Assistance: 6: Modified independent (Device/Increase time) Dynamic Standing Balance Dynamic Standing - Balance Support: Bilateral upper extremity supported;During functional activity Dynamic Standing - Level of Assistance: 5: Stand by assistance Extremity Assessment  RUE Assessment RUE Assessment: Within Functional Limits Passive Range of Motion (PROM) Comments: WFL Active Range of Motion (AROM) Comments: WFL General Strength Comments: MMT grossly 5/5 LUE Assessment LUE Assessment: Within Functional Limits Passive Range of Motion (PROM) Comments: WFL Active Range of Motion (AROM) Comments: WFL General Strength Comments: MMT grossly 5/5 RLE Assessment RLE Assessment: Exceptions to  [facility] General Strength Comments: DF 4-/5 LLE Assessment LLE Assessment: Exceptions to [facility] General Strength Comments: DF 3/5    Rosita DeChalus 06/08/2021, 4:17 PM

## 2021-06-07 NOTE — Plan of Care (Signed)
°  Problem: RH Wheelchair Mobility Goal: LTG Patient will propel w/c in controlled environment (PT) Description: LTG: Patient will propel wheelchair in controlled environment, # of feet with assist (PT) Flowsheets (Taken 06/07/2021 1230) LTG: Pt will propel w/c in controlled environ  assist needed:: (d/c goal due to progress) -- Note: D/c goal due to progress

## 2021-06-07 NOTE — Progress Notes (Signed)
Inpatient Rehabilitation Discharge Medication Review by a Pharmacist  A complete drug regimen review was completed for this patient to identify any potential clinically significant medication issues.  High Risk Drug Classes Is patient taking? Indication by Medication  Antipsychotic Yes Lithium, Vraylar, Lamictal for bipolar disorder, depression  Anticoagulant No   Antibiotic No   Opioid No   Antiplatelet No   Hypoglycemics/insulin Yes U-500 insulin  Vasoactive Medication Yes Norvasc for HTN  Chemotherapy No   Other No      Type of Medication Issue Identified Description of Issue Recommendation(s)  Drug Interaction(s) (clinically significant)     Duplicate Therapy     Allergy     No Medication Administration End Date     Incorrect Dose     Additional Drug Therapy Needed     Significant med changes from prior encounter (inform family/care partners about these prior to discharge).    Other       Clinically significant medication issues were identified that warrant physician communication and completion of prescribed/recommended actions by midnight of the next day:  No  Pharmacist comments: Resume Metformin, Mounjaro if appropriate for DM  Time spent performing this drug regimen review (minutes):  20 minutes   Elwin Sleight 06/07/2021 9:33 AM

## 2021-06-07 NOTE — Progress Notes (Signed)
Inpatient Rehabilitation Care Coordinator Discharge Note   Patient Details  Name: Taylan Marez MRN: [id number] Date of Birth: [birth year]   Discharge location: D/c to home with his brother Kalman Jewels of Stay: 8 DAYS  Discharge activity level: Mod I  Home/community participation: Limited  Patient response MV:VKPQAE Literacy - How often do you need to have someone help you when you read instructions, pamphlets, or other written material from your doctor or pharmacy?: Never  Patient response SL:PNPYYF Isolation - How often do you feel lonely or isolated from those around you?: Often  Services provided included: MD, RD, PT, OT, RN, CM, TR, Pharmacy, Neuropsych, SW  Financial Services:  Financial Services Utilized: Medicare    Choices offered to/list presented to: Yes  Follow-up services arranged:  Outpatient, DME    Outpatient Servicies: [facility] for PT/OT DME : [facility] for RW    Patient response to transportation need: Is the patient able to respond to transportation needs?: Yes In the past 12 months, has lack of transportation kept you from medical appointments or from getting medications?: No In the past 12 months, has lack of transportation kept you from meetings, work, or from getting things needed for daily living?: No  Comments (or additional information):  Patient/Family verbalized understanding of follow-up arrangements:  Yes  Individual responsible for coordination of the follow-up plan: contact pt  Confirmed correct DME delivered: Gretchen Short 06/07/2021    Gretchen Short

## 2021-06-07 NOTE — Progress Notes (Signed)
Hypoglycemic Event  CBG: 50   Treatment: 8 oz juice/soda  Symptoms: Shaky, [place],  Follow-up CBG: Time:141 CBG Result:2159  Possible Reasons for Event: Unknown  Comments/MD notified:Pt states that  he felt fine but "kinda feels different now that he knows". Pt request that maybe a snack at night or more food with his dinner tray since he seems to drop lower at night. Pt was given boost breeze (34g of sugar), 2 packets of graham crackers and half a portion of vanilla ice cream.   Melvenia Beam

## 2021-06-08 LAB — GLUCOSE, CAPILLARY
Glucose-Capillary: 163 mg/dL — ABNORMAL HIGH (ref 70–99)
Glucose-Capillary: 178 mg/dL — ABNORMAL HIGH (ref 70–99)
Glucose-Capillary: 66 mg/dL — ABNORMAL LOW (ref 70–99)
Glucose-Capillary: 145 mg/dL — ABNORMAL HIGH (ref 70–99)
Glucose-Capillary: 76 mg/dL (ref 70–99)

## 2021-06-08 MED ORDER — ACETAMINOPHEN 325 MG PO TABS
325.0000 mg | ORAL_TABLET | ORAL | Status: AC | PRN
Start: 2021-06-08 — End: ?

## 2021-06-08 MED ORDER — SENNOSIDES-DOCUSATE SODIUM 8.6-50 MG PO TABS
2.0000 | ORAL_TABLET | Freq: Two times a day (BID) | ORAL | 0 refills | Status: AC
  Filled 2021-06-08: qty 120, 30d supply, fill #0

## 2021-06-08 MED ORDER — HUMULIN R U-500 KWIKPEN 500 UNIT/ML ~~LOC~~ SOPN
60.0000 [IU] | PEN_INJECTOR | Freq: Three times a day (TID) | SUBCUTANEOUS | Status: AC
Start: 2021-06-08 — End: ?

## 2021-06-08 MED ORDER — INSULIN REGULAR HUMAN (CONC) 500 UNIT/ML ~~LOC~~ SOPN
60.0000 [IU] | PEN_INJECTOR | Freq: Three times a day (TID) | SUBCUTANEOUS | Status: DC
  Administered 2021-06-08 – 2021-06-09 (×3): 60 [IU] via SUBCUTANEOUS
  Filled 2021-06-08: qty 3

## 2021-06-08 MED ORDER — PROSOURCE PLUS PO LIQD: 30.0000 mL | Freq: Two times a day (BID) | ORAL | Status: AC

## 2021-06-08 NOTE — Progress Notes (Signed)
Physical Therapy Session Note  Patient Details  Name: Kaceton Vieau MRN: [id number] Date of Birth: 1976/06/15  Today's Date: 06/08/2021 PT Individual Time: 9449-6759 and 1100-1155 PT Individual Time Calculation (min): 55 min and 55 min  Short Term Goals: Week 1:  PT Short Term Goal 1 (Week 1): STG=LTG due to ELOS  Skilled Therapeutic Interventions/Progress Updates: Pt presented in recliner agreeable to therapy. Pt denies pain but states some increased tightness in hamstrings. Pt ambulated to rehab gym with RW and distant supervision to mod I. Pt then participated in ascending/descending stairs x 8 with 1 rail with pt unable perform additional 4 due to fatigue. PTA demonstrated and had pt perform seated hamstring stretch and standing gastroc stretch 20-30 sec x 3 bilaterally. Pt then participated in ambulation on compliant surface with CGA. Pt then ambulated to ADL apt mod I and performed bed mobility on standard bed independently. Pt returned to rehab gym and initiated Berg balance assessment. Due to rest breaks pt unable to complete Berg but will complete next session. Pt ambulated back to room with RW and mod I. Upon discussion with COTA and primary PT, pt upgraded to mod I in room. PTA explained process and pt verbalized understanding. PTA notified NT and nsg. Pt left in recliner at end of session with call bell within reach and needs met.   Tx2: Pt presented in recliner agreeable to therapy. Pt denies pain at rest. Pt ambulated to rehab gym mod I with RW. Pt completed Berg balance assessment with an improved score of 25/56 compared to 9/56 on assessment. PTA discussed with pt importance of energy conservation with pt then performing bouts of ambulation to [employer]. Pt was able to ambulate bouts of ~300-442f until needing seated rest. Pt did indicate that when he felt fatigued felt increased lateral sway (not very noticeable during ambulation). Pt ambulated back to room from N[employer] Corporationrequiring x 1 standing rest and x 1 seated rest. Upon return to room pt left in recliner with call bell within reach and needs met.      Therapy Documentation Precautions:  Precautions Precautions: Fall Restrictions Weight Bearing Restrictions: No General:   Vital Signs: Therapy Vitals Temp: 98.7 F (37.1 C) Pulse Rate: 83 Resp: 16 BP: (!) 147/78 Patient Position (if appropriate): Lying Oxygen Therapy SpO2: 98 % O2 Device: Room Air Pain:   Mobility:   Locomotion :    Trunk/Postural Assessment :    Balance:   Exercises:   Other Treatments:      Therapy/Group: Individual Therapy  Richerd Grime 06/08/2021, 3:59 PM

## 2021-06-08 NOTE — Progress Notes (Signed)
Occupational Therapy Session Note  Patient Details  Name: Jerry Wheeler MRN: [id number] Date of Birth: 05/24/1976  Today's Date: 06/08/2021 OT Individual Time: 2641-5830 OT Individual Time Calculation (min): 74 min    Short Term Goals: Week 1:  OT Short Term Goal 1 (Week 1): STG=LTG  Skilled Therapeutic Interventions/Progress Updates:    Pt resting in recliner upon arrival. OT intervention with focus on bahting at shower level, dressing with sit<>stand from EOB, grooming standing at sink, functional amb with RW, kitchen/home safety, and standing balance to increase independence with BADLs/IADLs and prepare for discharge home tomorrow. Amb in room with RW, bathing at shower level, and dressing with mod I/I. Grooming at sink with mod I. All transfers and toileting with mod I. Amb to gym with RW for standing activity at card board. Mod I with unilateral UE support. Pt requires CGA when BUE removed from RW. Amb to kitchen and discussed cooking safety and transporting of items in kitchen. Pt issued walker bag. Pt stated he has a chair he can place in kitchen to use when preparing meals. Recommended pt discuss further with OP therapists. Pt pleased with progress and ready for discharge home tomorrow. Provided handout of TTB equipment. Pt remained seated in recliner with all needs within reach.   Therapy Documentation Precautions:  Precautions Precautions: Fall Restrictions Weight Bearing Restrictions: No Pain:  Pt denies pain this morning   Therapy/Group: Individual Therapy  Rich Brave 06/08/2021, 8:51 AM

## 2021-06-08 NOTE — Progress Notes (Signed)
Occupational Therapy Discharge Summary  Patient Details  Name: Jerry Wheeler MRN: [id number] Date of Birth: 02-23-1976  Patient has met 9 of 9 long term goals due to improved activity tolerance, improved balance, postural control, ability to compensate for deficits, improved awareness, and improved coordination.  Pt made excellent progress with BADLs and functional transfers during this admission. Pt is mod I for all BADLs and transfers using RW. Pt discharging to brother's home initially before transitioning to his own home. Family education not completed since pt at mod I. Pt education completed. Pt pleased with progress and ready for discharge. Patient to discharge at overall Mod I level.  Patient's care partner is independent to provide the necessary physical assistance at discharge PRN.      Recommendation:  Patient will benefit from ongoing skilled OT services in outpatient setting to continue to advance functional skills in the area of iADL and Reduce care partner burden.  Equipment: No equipment provided  Reasons for discharge: treatment goals met and discharge from hospital  Patient/family agrees with progress made and goals achieved: Yes  OT Discharge Vision Baseline Vision/History: 1 Wears glasses Patient Visual Report: No change from baseline Vision Assessment?: No apparent visual deficits Perception  Perception: Within Functional Limits Praxis Praxis: Intact Cognition Overall Cognitive Status: Within Functional Limits for tasks assessed Arousal/Alertness: Awake/alert Orientation Level: Oriented X4 Year: 2022 Month: December Day of Week: Correct Attention: Sustained Sustained Attention: Appears intact Memory: Appears intact Immediate Memory Recall: Sock;Blue;Bed Memory Recall Sock: Without Cue Memory Recall Blue: Without Cue Memory Recall Bed: Without Cue Awareness: Appears intact Problem Solving: Appears intact Sensation Sensation Light Touch: Impaired  Detail Peripheral sensation comments: baseline peripheral neuropathy Light Touch Impaired Details: Impaired LLE;Impaired RLE Hot/Cold: Appears Intact Proprioception: Impaired by gross assessment Stereognosis: Not tested Coordination Gross Motor Movements are Fluid and Coordinated: No Fine Motor Movements are Fluid and Coordinated: No Coordination and Movement Description: mild dysmetria due to sensory deficits. mild baseline tremor Finger Nose Finger Test: Impaired 2/2 chronic BUE tremors. Motor  Motor Motor: Other (comment) Motor - Skilled Clinical Observations: mild tremor Trunk/Postural Assessment  Cervical Assessment Cervical Assessment: Within Functional Limits Thoracic Assessment Thoracic Assessment: Exceptions to [facility] (large body habitus) Lumbar Assessment Lumbar Assessment: Within Functional Limits Postural Control Trunk Control: Delayed  Balance Static Sitting Balance Static Sitting - Balance Support: Feet supported Static Sitting - Level of Assistance: 7: Independent Dynamic Sitting Balance Dynamic Sitting - Balance Support: During functional activity Dynamic Sitting - Level of Assistance: 6: Modified independent (Device/Increase time) Extremity/Trunk Assessment RUE Assessment RUE Assessment: Within Functional Limits Passive Range of Motion (PROM) Comments: WFL Active Range of Motion (AROM) Comments: WFL General Strength Comments: MMT grossly 5/5 LUE Assessment LUE Assessment: Within Functional Limits Passive Range of Motion (PROM) Comments: WFL Active Range of Motion (AROM) Comments: WFL General Strength Comments: MMT grossly 5/5   Leroy Libman 06/08/2021, 7:04 AM

## 2021-06-08 NOTE — Discharge Instructions (Addendum)
Inpatient Rehab Discharge Instructions  Quinterious Walraven Discharge date and time: 06/08/21   Activities/Precautions/ Functional Status: Activity: no lifting, driving, or strenuous exercise for till cleared by MD Diet: diabetic diet Wound Care: none needed   Functional status:  ___ No restrictions     ___ Walk up steps independently __X_ 24/7 supervision/assistance   ___ Walk up steps with assistance ___ Intermittent supervision/assistance  ___ Bathe/dress independently _X__ Walk with walker     ___ Bathe/dress with assistance ___ Walk Independently    ___ Shower independently ___ Walk with assistance    ___ Shower with assistance _X__ No alcohol     ___ Return to work/school ________  Special Instructions: Monitor blood sugars before meals and at bedtime.  Need to eat consistent meals--use supplement or protein bars if you are busy working.    My questions have been answered and I understand these instructions. I will adhere to these goals and the provided educational materials after my discharge from the hospital.  Patient/Caregiver Signature _______________________________ Date __________  Clinician Signature _______________________________________ Date __________  Please bring this form and your medication list with you to all your follow-up doctor's appointments.

## 2021-06-08 NOTE — Discharge Summary (Signed)
Physician Discharge Summary  °Patient ID: °Jerry Wheeler °MRN: [id number] °DOB/AGE: 47/09/1975 47 y.o. ° °Admit date: 06/01/2021 °Discharge date: 06/08/2021 ° °Discharge Diagnoses:  °Principal Problem: °  Lumbar disc disorder with myelopathy °Active Problems: °  Bipolar disorder (HCC) °  DM type 2 with diabetic peripheral neuropathy (HCC) °  Obesity (BMI 35.0-39.9 without comorbidity) ° ° °Discharged Condition: good ° °Significant Diagnostic Studies: °DG Abd 1 View ° °Result Date: 06/02/2021 °CLINICAL DATA:  Obstipation. EXAM: ABDOMEN - 1 VIEW COMPARISON:  None. FINDINGS: Nonobstructive bowel gas pattern. Mild-to-moderate amount of stool in the colon. Stool and gas in the rectum. No small bowel dilatation. IMPRESSION: Normal bowel gas pattern. Mild-to-moderate retained stool in the colon. Electronically Signed   By: Charles  Clark M.D.   On: 06/02/2021 15:13  ° ° °Labs:  °Basic Metabolic Panel: °BMP Latest Ref Rng & Units 06/05/2021 06/02/2021 06/01/2021  °Glucose 70 - 99 mg/dL 244(H) 74 81  °BUN 6 - 20 mg/dL 13 13 15  °Creatinine 0.61 - 1.24 mg/dL 1.00 1.08 0.93  °Sodium 135 - 145 mmol/L 136 137 138  °Potassium 3.5 - 5.1 mmol/L 4.0 4.1 3.8  °Chloride 98 - 111 mmol/L 104 107 107  °CO2 22 - 32 mmol/L 24 23 24  °Calcium 8.9 - 10.3 mg/dL 10.3 10.1 9.8  °  ° ° °CBC: °CBC Latest Ref Rng & Units 06/05/2021 06/02/2021 06/01/2021  °WBC 4.0 - 10.5 K/uL 7.0 9.6 8.6  °Hemoglobin 13.0 - 17.0 g/dL 13.8 13.5 12.7(L)  °Hematocrit 39.0 - 52.0 % 44.5 43.1 39.6  °Platelets 150 - 400 K/uL 342 268 268  °  ° ° °CBG: °Recent Labs  °Lab 06/08/21 °1204 06/08/21 °1646 06/08/21 °1747 06/08/21 °2121 06/09/21 °0631  °GLUCAP 178* 66* 145* 76 242*  ° ° °Brief HPI:   Jerry Wheeler is a 45 y.o. male with history of poorly controlled T2DM with peripheral neuropathy, bipolar disorder, OSA who was admitted to [facility] on 05/26/2021 with worsening of BLE weakness and numbness x3 weeks progressing to inability to walk for a day.  MRI spine done  revealing severe canal stenosis L3/L4 and moderate canal stenosis L2/L3.  LP done and protein was elevated due to 4003 red cells and no pleocytosis.  Hyperreflexia felt to be due to diabetes and neurosurgery consultation was recommended per neurology.  Dr. Cook evaluated patient and recommended aggressive management of DM as no compression signs noted.  Patient also reported not being interested in surgery at this time and plans are for follow-up on outpatient basis.  PT/OT initiated and patient was noted to be limited by weakness with sensory deficits, balance deficits and fatigue affecting ADLs and mobility.  CIR was recommended due to functional decline. ° ° °Hospital Course: Jerry Wheeler was admitted to rehab 06/01/2021 for inpatient therapies to consist of PT and OT at least three hours five days a week. Past admission physiatrist, therapy team and rehab RN have worked together to provide customized collaborative inpatient rehab.  Blood pressures were monitored on 3 times daily basis and were noted to be reasonably controlled.  Check of BMET showed lytes and renal status to be stable. CBC showed H/H to be stable.  CPAP was resumed due to history of OSA.  Mood has been stable and patient advised to follow-up with psychiatry past discharge ° °His p.o. intake has been good without any GI distress. KUB done showing mild to moderate stool burden and senna S was added and titrated to twice daily to help manage constipation.    His diabetes has been monitored with ac/hs CBG checks and SSI was use prn for tighter BS control.  He reported not having filled his Monjaro PTA therefore this as well as metformin was held during his stay.  His blood sugars continue to be variable and with increase in activity, he started having hypoglycemic episodes with blood sugars down to 50s and 60s occasionally. Insulin was decreased to 60 units TID ac and he was advised to monitor blood sugars frequently and follow-up with  endocrinology for further titration after discharge.  He has made steady goals during his rehab stay and is modified independent at discharge.  He will continue to receive outpatient PT and OT at [facility] outpatient rehab after discharge. ° ° °Rehab course: During patient's stay in rehab team conferences were held to monitor patient's progress, set goals and discuss barriers to discharge. At admission, patient required min assist with mobility and with ADL tasks. He has had improvement in activity tolerance, balance, postural control as well as ability to compensate for deficits.  He is able to complete ADL tasks at modified independent level.  He is modified independent for transfers and to ambulate 300 to 400 feet with rolling walker.  Family education was completed with brother. °  °Disposition: home ° °Diet: Diabetic diet ° °Special Instructions: °Resume use of libre and monitor BS at least ac/hs. °2.Need to follow up with psychiatry after discharge.  ° ° °Discharge Instructions   ° ° Ambulatory referral to Occupational Therapy   Complete by: As directed °  ° Eval and treat  ° °  ° °Allergies as of 06/09/2021   °No Known Allergies °  ° °  °Medication List  °  ° °STOP taking these medications   ° °metFORMIN 1000 MG tablet °Commonly known as: GLUCOPHAGE °  °Mounjaro 5 MG/0.5ML Pen °Generic drug: tirzepatide °  ° °  ° °TAKE these medications   ° °(feeding supplement) PROSource Plus liquid °Take 30 mLs by mouth 2 (two) times daily between meals. °  °Accu-Chek Aviva Plus test strip °Generic drug: glucose blood °U TID °  °Accu-Chek Aviva Plus w/Device Kit °U UTD D TO CHECK BS °  °acetaminophen 325 MG tablet °Commonly known as: TYLENOL °Take 1-2 tablets (325-650 mg total) by mouth every 4 (four) hours as needed for mild pain. °  °amLODipine 5 MG tablet °Commonly known as: NORVASC °Take 5 mg by mouth daily. °  °aspirin EC 81 MG tablet °Take by mouth. °  °atorvastatin 40 MG tablet °Commonly known as: LIPITOR °TAKE 1 TABLET  BY MOUTH EVERY DAY °  °blood glucose meter kit and supplies Kit °Use daily to check blood sugars °  °fenofibrate 160 MG tablet °TAKE 1 TABLET BY MOUTH EVERY DAY °  °HumuLIN R U-500 KwikPen 500 UNIT/ML KwikPen °Generic drug: insulin regular human CONCENTRATED °Inject 60 Units into the skin 3 (three) times daily with meals. 95 units 1200 with first meal °75 units 1800 with lunch °85 units 2400 with dinner °What changed:  °how much to take °how to take this °  °Insulin Syringe-Needle U-100 31G X 5/16" 1 ML Misc °USE AS DIRECTED 3 TIMES DAILY °  °lamoTRIgine 150 MG tablet °Commonly known as: LAMICTAL °Take 150 mg by mouth 2 (two) times daily. °  °lithium carbonate 300 MG CR tablet °Commonly known as: LITHOBID °Take 1 tablet (300 mg total) by mouth 2 (two) times daily. °  °Senexon-S 8.6-50 MG tablet °Generic drug: senna-docusate °Take 2 tablets by mouth 2 (  two) times daily. °  °vitamin B-12 1000 MCG tablet °Commonly known as: CYANOCOBALAMIN °Take 1,000 mcg by mouth daily. °  °Vraylar 3 MG capsule °Generic drug: cariprazine °Take 3 mg by mouth daily. °  ° °  ° ° Follow-up Information   ° ° Lovorn, Megan, MD Follow up.   °Specialty: Physical Medicine and Rehabilitation °Why: As needed °Contact information: °[address] °[address] °[place] [address] °[phone] ° ° °  °  ° ° Linthavong, Kanhka, MD. Call.   °Specialty: Family Medicine °Why: for post hospital follow up °Contact information: °[address] °[facility] °[place] [address] °[phone] ° ° °  °  ° ° Su, Hansen, MD. Call.   °Specialty: Psychiatry °Why: for follow up appointment °Contact information: °[address] °[place] [address] °[phone] ° ° °  °  ° ° Cook, Steven, MD. Call.   °Specialty: Neurosurgery °Why: for follow up on back/weakness °Contact information: °[address] °[place] [address] °[phone] ° ° °  °  ° °  °  ° °  ° ° °Signed: ° S  °06/15/2021, 12:14 AM °  °

## 2021-06-08 NOTE — Progress Notes (Signed)
PROGRESS NOTE   Subjective/Complaints:  Pt reports had a low BG last night- otherwise, ready for d/c tomorrow- working with OT to get dressed.  No pain.   ROS:  Pt denies SOB, abd pain, CP, N/V/C/D, and vision changes   Objective:   No results found. No results for input(s): WBC, HGB, HCT, PLT in the last 72 hours.  No results for input(s): NA, K, CL, CO2, GLUCOSE, BUN, CREATININE, CALCIUM in the last 72 hours.   Intake/Output Summary (Last 24 hours) at 06/08/2021 0913 Last data filed at 06/08/2021 0833 Gross per 24 hour  Intake 840 ml  Output --  Net 840 ml        Physical Exam: Vital Signs Blood pressure 127/75, pulse 71, temperature 97.9 F (36.6 C), resp. rate 18, height 6\' 7"  (2.007 m), weight (!) 156 kg, SpO2 98 %.     General: awake, alert, appropriate, sitting EOB getting dressed with OTA;  NAD HENT: conjugate gaze; oropharynx moist CV: regular rate; no JVD Pulmonary: CTA B/L; no W/R/R- good air movement GI: soft, NT, ND, (+)BS Psychiatric: appropriate- flat affect Neurological: Ox3-  Neuro:  Alert and oriented x 3. Normal insight and awareness. Intact Memory. Normal language and speech. Cranial nerve exam unremarkable. UE 5/5 . LE: 4-/5 prox to 4/5 distally. Stocking glove sensory loss to ankles bilaterally. DTR's 1+ Musculoskeletal: normal AROM and PROM. No back pain even with SLR's    Assessment/Plan: 1. Functional deficits which require 3+ hours per day of interdisciplinary therapy in a comprehensive inpatient rehab setting. Physiatrist is providing close team supervision and 24 hour management of active medical problems listed below. Physiatrist and rehab team continue to assess barriers to discharge/monitor patient progress toward functional and medical goals  Care Tool:  Bathing    Body parts bathed by patient: Right arm, Left arm, Chest, Abdomen, Left upper leg, Right lower leg, Left  lower leg, Face, Front perineal area, Right upper leg, Buttocks   Body parts bathed by helper: Buttocks     Bathing assist Assist Level: Independent with assistive device     Upper Body Dressing/Undressing Upper body dressing   What is the patient wearing?: Pull over shirt    Upper body assist Assist Level: Independent    Lower Body Dressing/Undressing Lower body dressing      What is the patient wearing?: Pants, Underwear/pull up     Lower body assist Assist for lower body dressing: Independent with assitive device     Toileting Toileting    Toileting assist Assist for toileting: Independent with assistive device Assistive Device Comment: urinal   Transfers Chair/bed transfer  Transfers assist     Chair/bed transfer assist level: Supervision/Verbal cueing     Locomotion Ambulation   Ambulation assist      Assist level: Supervision/Verbal cueing Assistive device: Walker-rolling Max distance: 150'   Walk 10 feet activity   Assist     Assist level: Supervision/Verbal cueing Assistive device: Walker-rolling   Walk 50 feet activity   Assist Walk 50 feet with 2 turns activity did not occur: Safety/medical concerns  Assist level: Supervision/Verbal cueing Assistive device: Walker-rolling    Walk 150 feet activity  Assist Walk 150 feet activity did not occur: Safety/medical concerns  Assist level: Supervision/Verbal cueing Assistive device: Walker-rolling    Walk 10 feet on uneven surface  activity   Assist Walk 10 feet on uneven surfaces activity did not occur: Safety/medical concerns         Wheelchair     Assist Is the patient using a wheelchair?: Yes Type of Wheelchair: Manual    Wheelchair assist level: Supervision/Verbal cueing Max wheelchair distance: 150    Wheelchair 50 feet with 2 turns activity    Assist        Assist Level: Supervision/Verbal cueing   Wheelchair 150 feet activity     Assist       Assist Level: Supervision/Verbal cueing   Blood pressure 127/75, pulse 71, temperature 97.9 F (36.6 C), resp. rate 18, height 6\' 7"  (2.007 m), weight (!) 156 kg, SpO2 98 %.  Medical Problem List and Plan: 1. Functional deficits secondary to diabetic peripheral neuropathy and severe lumbar spinal stenosis.             -patient may shower             -ELOS/Goals: 1 week             12/21- d/c date 12/23- con't CIR_ PT and OT  12/22- finishing therapy today- d/c tomorrow- Con't CIR 2.  Impaired mobility, ambulating 20 feet: continue Lovenox.  3. Pain Management: N/A 10. Bipolar disorder w/ anxiety: Continue Lamictal,  Vaylar and Lithium  --Followed by Dr. 1/23 (Psychiatry in [place]) -pt's mood appropriate thus far 12/20- bipolar doing well- slightly flat- con't regimen 5. Neuropsych: This patient is capable of making decisions on his own behalf. 6. Skin/Wound Care: Routine pressure relief measures. 7. Fluids/Electrolytes/Nutrition: Monitor I/O- 8. T2DM: Hgb A1C-11.3 and poorly controlled. Per records out of ozempic X 4 weeks due to national back order.              --Per 12/07 endocrine visit--> on Humulin R-500 95 units am/75 units lunch/85 units supper and was to transition to Mounjaro 5 mg/day. Continue to hold metformin             --monitor BS ac/hs   --SSI has not been used as patient appears brittle.   --blood sugars in 70's prior to admit. Humulin decreased to 80  units TID per recommendation by DM coordinator.   CBG (last 3)  Recent Labs    06/07/21 2109 06/07/21 2159 06/08/21 0614  GLUCAP 50* 141* 163*   12/19- CBGs down to 50 this AM- decrease insulin to 65 units TID with meals 12/20- got 50 units last night, but on 65 units BID and 60 units with lunch. Will monitor today. 12/21- CBGs 71-99 since last evening- will give 1 more day before change Insulin.  12/22- will decrease Insulin to 60 units with meals TID- educated pt that he was requiring a LOT more insulin at  home than here- 1/3 difference minimum-and needs ot watch that when goes home- was on 90 units TID at home.   9. HTN: Monitor BP TID. Continue Norvasc daily  12/22- BP controlled- con't regimen 11. OSA: Resume CPAP 12.Chronic insomnia: Managed by Hydralazine HS prn?  12/20- sleeping well per pt- con't regimen 13.  H/o emphysema: Has on going use of E-ciggs 14. Gastroparesis: Intermittent N/V   *addendum: kub reviewed and only mild/moderate stool, normal pattern -will increase  senna-s BID, dulc supp PRN  12/19- LBM this AM- denies constipation today.  LOS: 7 days A FACE TO FACE EVALUATION WAS PERFORMED  Mercedez Boule 06/08/2021, 9:13 AM

## 2021-06-08 NOTE — Progress Notes (Signed)
3 [employer] packages arrived for pt. RN delivered to pt in room.

## 2021-06-09 ENCOUNTER — Other Ambulatory Visit (HOSPITAL_COMMUNITY): Payer: Self-pay

## 2021-06-09 LAB — GLUCOSE, CAPILLARY: Glucose-Capillary: 242 mg/dL — ABNORMAL HIGH (ref 70–99)

## 2021-06-09 NOTE — Progress Notes (Signed)
Patient discharged to home with brother. Patient stated that they understood all discharge instructions. Cletis [employer], LPN

## 2021-06-21 ENCOUNTER — Inpatient Hospital Stay: Payer: Medicare Other | Admitting: Physical Medicine and Rehabilitation

## 2021-06-26 ENCOUNTER — Encounter: Payer: Medicare Other | Admitting: Occupational Therapy

## 2021-06-26 ENCOUNTER — Ambulatory Visit: Payer: Medicare Other | Admitting: Physical Therapy

## 2021-06-28 ENCOUNTER — Ambulatory Visit: Payer: Medicare Other

## 2021-07-05 ENCOUNTER — Ambulatory Visit: Payer: Medicare Other

## 2021-07-05 ENCOUNTER — Encounter: Payer: Medicare Other | Admitting: Occupational Therapy

## 2021-07-10 ENCOUNTER — Ambulatory Visit: Payer: Medicare Other

## 2021-07-10 ENCOUNTER — Encounter: Payer: Medicare Other | Admitting: Occupational Therapy

## 2021-07-13 ENCOUNTER — Ambulatory Visit: Payer: Medicare Other | Attending: Physical Medicine and Rehabilitation | Admitting: Physical Therapy

## 2021-07-13 ENCOUNTER — Other Ambulatory Visit: Payer: Self-pay

## 2021-07-13 ENCOUNTER — Ambulatory Visit: Payer: Medicare Other

## 2021-07-13 ENCOUNTER — Encounter: Payer: Self-pay | Admitting: Physical Therapy

## 2021-07-13 DIAGNOSIS — R262 Difficulty in walking, not elsewhere classified: Secondary | ICD-10-CM | POA: Insufficient documentation

## 2021-07-13 DIAGNOSIS — R278 Other lack of coordination: Secondary | ICD-10-CM | POA: Diagnosis present

## 2021-07-13 DIAGNOSIS — M6281 Muscle weakness (generalized): Secondary | ICD-10-CM

## 2021-07-13 DIAGNOSIS — R2681 Unsteadiness on feet: Secondary | ICD-10-CM | POA: Diagnosis present

## 2021-07-13 DIAGNOSIS — M5106 Intervertebral disc disorders with myelopathy, lumbar region: Secondary | ICD-10-CM | POA: Diagnosis present

## 2021-07-13 NOTE — Therapy (Signed)
[facility] MAIN [facility] SERVICES [address] [place], [place], [address] Phone: [phone]   Fax:  [phone]  Physical Therapy Evaluation  Patient Details  Name: Jerry Wheeler MRN: [id number] Date of Birth: 01/14/1976 Referring Provider (PT): Delle Reining PA/ Burnadette Pop PCP   Encounter Date: 07/13/2021   PT End of Session - 07/13/21 1542     Visit Number 1    Number of Visits 17    Date for PT Re-Evaluation 09/07/21    Authorization Type medicare    PT Start Time 1432    PT Stop Time 1532    PT Time Calculation (min) 60 min    Equipment Utilized During Treatment Gait belt    Activity Tolerance Patient limited by fatigue    Behavior During Therapy WFL for tasks assessed/performed             Past Medical History:  Diagnosis Date   Anxiety disorder    Bipolar disorder (HCC)    Diabetes mellitus (HCC)    Diabetic peripheral neuropathy associated with type 2 diabetes mellitus (HCC)    Emphysema lung (HCC)    hospitalization 1999   High blood pressure    Major depressive disorder in partial remission (HCC)    Morbid obesity (HCC)    Osteomyelitis of great toe of left foot (HCC)    Sleep apnea    Tobacco use disorder, continuous    Ulcer of right heel (HCC)    followed by podiatry--fully healed 09/2020    Past Surgical History:  Procedure Laterality Date   DG GREAT TOE LEFT FOOT  2010   s/p I and D for osteo    There were no vitals filed for this visit.    Subjective Assessment - 07/13/21 1439     Subjective "I never had much pain. I still get stiff and my back will get a little sore with prolonged sitting." He reports feeling like he is still weak; "I have been mostly doing just daily activities. I can walk a short distance without my walker, but I use the walker most of the time."    Pertinent History 46 yo Male admitted to the hospital 05/26/2021 - 06/01/2021 at [facility] with lower extremity weakness secondary to  peripheral neuropathy and severe spinal stenosis seen on MRI on 05/26/2021. He was discharged to [facility] rehab 06/01/2021 - 06/09/2021. He was discharged to his brother's home. Prior to weakness he was in a 2 story apartment with stairs to enter. Therefore he is currently living at his brothers home which is 2 story but living is on the main floor; he does have 4 steps to enter home with B railings; He reports navigating those steps well. He is currenlty ambulating with RW, able to take a few steps without walker. Reports falling on 12/9 prior to admittance but otherwise no new falls. He denies any significant numbness/tingling; He does have neuropathy in the feet from diabetes but he doesn't feel that it has worsened. He does have an appointment with Dr. Adriana Simas (neurosurgeon); He is not sure that they will recommend surgery due to uncontrolled diabetes and concern that surgery may not improve condition;    How long can you sit comfortably? several hours    How long can you stand comfortably? Unsure- able to stand at least 5 min but hasn't tested it.    How long can you walk comfortably? about 500 feet with fatigue, does use RW    Diagnostic tests MRI  on 05/26/21 IMPRESSION:  1. Multilevel lumbar spondylosis, as detailed above.  2. Severe canal stenosis at L3-L4.  3. Moderate canal stenosis at L2-L3.  4. Mild-to-moderate canal stenosis with moderate-severe left  foraminal stenosis at L4-L5.    Patient Stated Goals "get back into my apartment- negotiate steps, walk without walker"    Currently in Pain? Yes    Pain Score 4     Pain Location Hip    Pain Orientation Right    Pain Descriptors / Indicators Aching    Pain Type Acute pain    Pain Onset Today    Aggravating Factors  likely sitting in the chairs    Pain Relieving Factors movement/repositioning    Multiple Pain Sites No                [facility] PT Assessment - 07/13/21 1452       Assessment   Medical Diagnosis Weakness secondary lumbar  stenosis    Referring Provider (PT) Delle Reining PA/ Burnadette Pop PCP    Onset Date/Surgical Date 05/26/21    Hand Dominance Right    Next MD Visit --   See Dr. Burnadette Pop in 3-6 months; Will also see Dr. Adriana Simas   Prior Therapy Inpatient rehab:12/15-12/23; no outpatient rehab for this condition;      Precautions   Precautions Fall      Restrictions   Weight Bearing Restrictions No      Balance Screen   Has the patient fallen in the past 6 months Yes    How many times? 1    Has the patient had a decrease in activity level because of a fear of falling?  No    Is the patient reluctant to leave their home because of a fear of falling?  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, Psychiatric nurse   Overall Cognitive Status Within Functional Limits for tasks assessed      Observation/Other Assessments   Observations does exhibit increased edema in RLE, lower leg (been going on for last several year)      Sensation   [facility] Monofilament Scale Loss of Protective   loss of protective in BLE foot/ankle   Additional Comments able to feel deep pressure;      Coordination   Gross Motor Movements are Fluid and Coordinated Yes    Fine Motor Movements are Fluid and Coordinated Yes    Heel Shin Test accurate bilaterally      Posture/Postural Control    Posture Comments WFL      ROM / Strength   AROM / PROM / Strength AROM;Strength      AROM   Overall AROM Comments BUE and BLE are [facility]  Strength   Strength Assessment Site Hip;Knee;Ankle    Right/Left Hip Right;Left    Right Hip Flexion 4-/5    Right Hip ABduction 3+/5    Right Hip ADduction 3+/5    Left Hip Flexion 4+/5    Left Hip ABduction 3+/5    Left Hip ADduction 4/5    Right/Left Knee Right;Left    Right Knee Flexion 5/5    Right Knee Extension 5/5    Left Knee Flexion 5/5    Left Knee Extension 5/5    Right/Left Ankle Right;Left    Right Ankle Dorsiflexion 2-/5    Left Ankle Dorsiflexion 2-/5      Palpation   Palpation comment reports no tenderness to palpation      Transfers   Comments requires 2 HHA to push up on chair for transfers, likely limited due to height at 6'7"      Ambulation/Gait   Gait Comments Pt ambulates with RW, normal base of support, decreased heel strike with increased foot drag, increased toe out/hip ER to compensate for toe drag, increased lean on RW with increased distance; slightly slower gait speed, decreased hip flexion during swing;      6 Minute Walk- Baseline   6 Minute Walk- Baseline yes    BP (mmHg) 134/79    HR (bpm) 98    02 Sat (%RA) 100 %      6 Minute walk- Post Test   6 Minute Walk Post Test yes    BP (mmHg) 146/81    HR (bpm) 120    02 Sat (%RA) 100 %    Modified Borg Scale for Dyspnea 4- somewhat severe      6 minute walk test results    Aerobic Endurance Distance Walked 600    Endurance additional comments with RW, increased shortness of breath, self stopped at 5.5 min      High Level Balance   High Level Balance Comments able to stand for short time unsupported, requires close supervision; dynamic balance is fair requiring RW and close supervision                        Objective measurements completed on examination: See above findings.   Will address HEP next session;               PT Education - 07/13/21 1541     Education Details recommendation    Person(s) Educated Patient    Methods Explanation    Comprehension Verbalized understanding              PT Short Term Goals - 07/13/21 1548       PT SHORT TERM GOAL #1   Title Patient will be adherent to HEP at least 3x a week to improve functional strength and balance for better safety at home.    Time 4    Period Weeks    Status New    Target Date 08/10/21      PT SHORT TERM GOAL #2   Title Patient will demonstrate improvement in foot/ankle strength by being able to complete BLE heel raise with minimal UE support for better gait safety and mobility;    Time 4    Period Weeks    Status New    Target Date 08/10/21               PT Long Term Goals - 07/13/21 1550       PT LONG TERM  GOAL #1   Title Patient will increase BLE gross strength to 4+/5  especially in hip as to improve functional strength for independent gait, increased standing tolerance and increased ADL ability.    Time 8    Period Weeks    Status New    Target Date 09/07/21      PT LONG TERM GOAL #2   Title Patient will increase six minute walk test distance to >800 for progression toward community ambulator and improve gait ability    Time 8    Period Weeks    Status New    Target Date 09/07/21      PT LONG TERM GOAL #3   Title Patient will demonstrate an improved Berg Balance Score of > 42/56 as to demonstrate improved balance with ADLs such as sitting/standing and transfer balance and reduced fall risk.    Time 8    Period Weeks    Status New    Target Date 09/07/21      PT LONG TERM GOAL #4   Title Patient will reduce timed up and go to <14 seconds to reduce fall risk and demonstrate improved transfer/gait ability.    Time 8    Period Weeks    Status New    Target Date 09/07/21                    Plan - 07/13/21 1543     Clinical Impression Statement 46 yo Male admitted to  hospital on 05/26/21 with new onset weakness in BLE as a result of spinal stenosis. Patient reports minimal back pain and reports biggest limitation is fatigue and weakness. He did receieve 1-2 weeks of inpatient rehab and has since been discharged home. Patient is currently ambulating mod I with RW. He ambulates with slower gait speed, decreased step length and increased foot drag/foot slap. he often ambulates with hip ER and toe out to compensate for foot drop. he does exhibit significant weakness in foot/ankle. He also exhibits mild weakness in BLE hip. Patient does exhibits numbness in BLE feet and loss of protective sensation in BLE feet and ankle. PT is unsure how many deficits are related to diabetic peripheral neuropathy and spinal stenosis. Patient reports having some drop foot prior to stenosis but didn't feel it was as severe. Patient would benefit from skilled PT Intervention to address weakness and immobility. PMH Significant: Obesity, Anxiety/depression, bipolar disorder, Type 2 diabetes with neuropathy, insomnia, HTN, Lumbar stenosis    Personal Factors and Comorbidities Comorbidity 3+;Fitness;Time since onset of injury/illness/exacerbation    Comorbidities PMH Significant: Obesity, Anxiety/depression, bipolar disorder, Type 2 diabetes with neuropathy, insomnia, HTN, Lumbar stenosis    Examination-Activity Limitations Caring for Others;Carry;Locomotion Level;Squat;Stairs;Stand;Transfers    Examination-Participation Restrictions Cleaning;Community Activity;Driving;Occupation;Shop;Volunteer;Yard Work    Conservation officer, historic buildingstability/Clinical Decision Making Stable/Uncomplicated    OptometristClinical Decision Making Low    Rehab Potential Fair    PT Frequency 2x / week    PT Duration 8 weeks    PT Treatment/Interventions ADLs/Self Care Home Management;Cryotherapy;Moist Heat;Gait training;Stair training;Functional mobility training;Therapeutic activities;Therapeutic exercise;Balance training;Neuromuscular  re-education;Patient/family education;Orthotic Fit/Training;Manual techniques    PT Next Visit Plan formal balance assessment, initiate HEP    PT Home Exercise Plan will address next session    Recommended Other Services currently receiving OT services    Consulted and Agree with Plan of Care Patient             Patient will benefit from skilled therapeutic intervention in order to improve the following  deficits and impairments:  Abnormal gait, Decreased balance, Decreased endurance, Decreased mobility, Difficulty walking, Impaired sensation, Obesity, Increased edema, Decreased activity tolerance, Decreased safety awareness, Decreased strength  Visit Diagnosis: Muscle weakness (generalized)  Unsteadiness on feet  Difficulty in walking, not elsewhere classified     Problem List Patient Active Problem List   Diagnosis Date Noted   Lumbar disc disorder with myelopathy 06/01/2021   Unsteady gait 05/27/2021   Fall at home, initial encounter 05/27/2021   Lower extremity weakness 05/26/2021   Obesity (BMI 35.0-39.9 without comorbidity) 05/26/2021   Anxiety 09/12/2017   Bipolar disorder (HCC) 09/12/2017   Insulin dependent diabetes mellitus 09/12/2017   Primary insomnia 09/12/2017   Pure hypercholesterolemia 09/12/2017   Recurrent major depressive disorder, in partial remission (HCC) 09/12/2017   Tobacco dependence 09/12/2017   Vitamin D deficiency 09/12/2017   DM type 2 with diabetic peripheral neuropathy (HCC) 04/06/2016   Hypercalcemia 09/13/2015   Hypogonadism in male 09/13/2015   Non morbid obesity due to excess calories 09/13/2015    Sharron Petruska, PT, DPT 07/13/2021, 3:52 PM   [facility] [address] [place], [place], [id number] Phone: [phone]   Fax:  [phone]  Name: Jerry Wheeler MRN: [id number] Date of Birth: Apr 11, 1976

## 2021-07-14 NOTE — Therapy (Signed)
[facility] MAIN [facility] [address] [place], [place], [address] Phone: [phone]   Fax:  [phone]  Occupational Therapy Evaluation  Patient Details  Name: Jerry Wheeler MRN: [id number] Date of Birth: September 24, 1975 Referring Provider (OT): Delle ReiningPamela Love, [place]   Encounter Date: 07/13/2021   OT End of Session - 07/14/21 0835     Visit Number 1    Number of Visits 12    Date for OT Re-Evaluation 08/23/21    OT Start Time 1300    OT Stop Time 1400    OT Time Calculation (min) 60 min    Equipment Utilized During Treatment RW    Activity Tolerance Patient tolerated treatment well    Behavior [facility] for tasks assessed/performed             Past Medical History:  Diagnosis Date   Anxiety disorder    Bipolar disorder (HCC)    Diabetes mellitus (HCC)    Diabetic peripheral neuropathy associated with type 2 diabetes mellitus (HCC)    Emphysema lung (HCC)    hospitalization 1999   High blood pressure    Major depressive disorder in partial remission (HCC)    Morbid obesity (HCC)    Osteomyelitis of great toe of left foot (HCC)    Sleep apnea    Tobacco use disorder, continuous    Ulcer of right heel (HCC)    followed by podiatry--fully healed 09/2020    Past Surgical History:  Procedure Laterality Date   DG GREAT TOE LEFT FOOT  2010   s/p I and D for osteo    There were no vitals filed for this visit.   Subjective Assessment - 07/14/21 0811     Subjective  "My barriers for getting back to my own apartment are being able to safely be in the kitchen and be able to get up my stairs."    Pertinent History poorly controlled T2DM with peripheral neuropathy, bipolar disorder, OSA who [facility] with worsening of BLE weakness and  numbness for 3 weeks progressing to inability to walk x one day. Neurology consulted and MRI spine done revealing severe canal stenosis L3/L4, moderate canal stenosis  L2/L3 and mild to moderate canal stenosis with  moderate to severe left foraminal stenosis at L4/L5.    Limitations Muscle weakness, mild BUE tremors, impaired balance and gait, hx of falling, neuropathy in BLEs    Patient Stated Goals "I want to improve my balance so I can safely be in my kitchen to cook."    Currently in Pain? Yes    Pain Score 4     Pain Location Hip    Pain Orientation Right    Pain Descriptors / Indicators Aching    Pain Type Acute pain    Pain Onset Today    Pain Frequency Intermittent    Aggravating Factors  likely sitting in chairs    Pain Relieving Factors movement/repositioning    Effect of Pain on Daily Activities discomfort with prolonged positioning    Multiple Pain Sites No               [facility] OT Assessment - 07/14/21 0001       Assessment   Medical Diagnosis Weakness secondary lumbar stenosis    Referring Provider (OT) Delle ReiningPamela Love, PA    Onset Date/Surgical Date 05/26/21    Hand Dominance Right    Next MD Visit Dr. Burnadette PopLinthavong in 3-6 mo  Prior Therapy inpatient rehab at [facility] ~1 week      Precautions   Precautions Fall      Restrictions   Weight Bearing Restrictions No      Balance Screen   Has the patient fallen in the past 6 months Yes    How many times? 1    Has the patient had a decrease in activity level because of a fear of falling?  No    Is the patient reluctant to leave their home because of a fear of falling?  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, video editing      ADL   Eating/Feeding Independent    Grooming Modified independent   limited standing tolerance, and 1 hand needed on support surface in standing   Upper Body Bathing Modified independent   tub bench   Lower Body Bathing  Modified independent   tub bench   Upper Body Dressing Increased time   RW to obtain clothing from closet or Manufacturing systems engineer Dressing Increased time   RW to obtain clothing from closet or Gaffer independent   standard height but pushes up from sink countertop   Designer, jewellery independent   transfer tub bench   Transfers/Ambulation Related to ADL's Using RW for all ADLs and functional mobility    ADL comments Pt's goal is to perform ADLs/IADLs wtihout need for walker      IADL   Prior Level of Function Light Housekeeping indep    Light Housekeeping All laundry must be done by others    Prior Level of Function Meal Prep indep without AD    Meal Prep Able to complete simple cold meal and snack prep;Able to complete simple warm meal prep   not yet able to use stove top d/t requiring 2 hands on walker in standing   Medication Management Is responsible for taking medication in correct dosages at correct time      Mobility   Mobility Status Comments Pt was indep with mobility prior to hospital stay, but balance was becoming progressively worse      Vision - History   Baseline Vision Wears glasses all the time      Cognition   Overall Cognitive Status Within Functional Limits for tasks assessed      Observation/Other Assessments   Focus on Therapeutic Outcomes (FOTO)  51    Standing Functional Reach Test 9" without AD, 13" with 1 hand on walker for support      Posture/Postural Control   Posture Comments WFL      Sensation   Semmes Weinstein Monofilament Scale Loss of Protective   Per PT eval   Additional Comments able to feel deep pressure;   per PT eval     Coordination   Gross Motor Movements are Fluid and Coordinated Yes    Fine Motor Movements are Fluid and Coordinated No    Coordination and Movement Description hx of bilat hand tremors    Right 9 Hole Peg Test 38 sec    Left 9 Hole Peg Test 46 sec    Tremors bilat hands       AROM  Overall AROM Comments BUEs WFL      Strength   Overall Strength Comments BUEs 5/5      Hand Function   Right Hand Grip (lbs) 66    Right Hand Lateral Pinch 13 lbs    Left Hand Grip (lbs) 68    Left Hand Lateral Pinch 15 lbs            Occupational Therapy Evaluation: Pt is a 46 y/o male post hospitalization and inpatient rehab stay d/t progressive weakness related to uncontrolled T2DM and lumbar stenosis, fall with progressive worsening of balance and functional mobility.  Prior to hospitalization, pt was living a lone in a 2 story apartment and did not require AD.  Pt was on disability for hx of depression.  Pt is now requiring use of RW and staying with his brother until pt can return home.  Barriers to returning home are stairs to enter apartment building, and stairs to reach 2nd floor of apartment where bedroom and bathroom are, as well as manage IADLs, specifically meal preparation and laundry.  Pt has hx of BUE tremors, currently presents with weak grip, but otherwise BUEs are 5/5, impaired standing tolerance, impaired standing balance, all of which are limiting pt's indep with ADLs.  Pt will benefit from skilled OT to address deficits noted above; pt in agreement with goals and OT poc.    Therapeutic Exercise: Participated in dynamic sitting EOB with forward, lateral R/L outside BOS, reach to feet, posterior lean without UE support, no LOB.  Challenged dynamic standing balance with functional reaching forward outside BOS with and without RW.  See note for details.  Pt is cautious and limited without UE support, requires widened BOS and close supv without hand on walker for support.     OT Education - 07/14/21 0835     Education Details Role of OT, goals, poc    Person(s) Educated Patient    Methods Explanation;Verbal cues    Comprehension Verbalized understanding;Verbal cues required              OT Short Term Goals - 07/14/21 [id number]       OT SHORT TERM GOAL #1    Title Pt will verbalize and demonstrate 3 fall prevention strategies to reduce fall risk with ADLs/IADLs.    Baseline Eval: not yet initiated    Time 3    Period Weeks    Status New    Target Date 08/02/21      OT SHORT TERM GOAL #2   Title Pt will verbalize 3 compensatory strategies to manage BUE tremors during ADL completion.    Baseline Eval: education needed    Time 3    Period Weeks    Status New    Target Date 08/02/21               OT Long Term Goals - 07/14/21 0841       OT LONG TERM GOAL #1   Title Pt will increase FOTO score to 60 or better to indicate increased functional performance.    Baseline Eval: FOTO 51.    Time 6    Period Weeks    Status New    Target Date 08/23/21      OT LONG TERM GOAL #2   Title Pt will increase functional reach to >10" without UE support to decrease fall risk with functional reaching during daily tasks.    Baseline Eval: Functional reach 9" without AD, 13" with 1 hand on  support surface.    Time 6    Period Weeks    Status New    Target Date 08/25/21      OT LONG TERM GOAL #3   Title Pt will increase 360 degree turn test score to WNL (4 secs or less) to indicate improved dynamic standing/decreased fall risk within small spaces for kitchen mobility.    Baseline Eval: 360 turn test score 7 sec with RW    Time 6    Period Weeks    Status New    Target Date 08/23/21      OT LONG TERM GOAL #4   Title Pt will be modified indep to perform hot meal prep at stove top.    Baseline Eval: Dep for hot meal prep, using microwave only and preparing cold meals d/t limited ability to release hands from walker.    Time 6    Period Weeks    Status New    Target Date 08/23/21      OT LONG TERM GOAL #5   Title Pt will manage laundry tasks with modified indep.    Baseline Eval: max A; brother currently managing pt's laundry    Time 6    Period Weeks    Status New    Target Date 08/23/21      Long Term Additional Goals   Additional  Long Term Goals Yes      OT LONG TERM GOAL #6   Title Pt will increase bilat grip strength by 10 or more lbs to improve ability to hold and carry ADL supplies.    Baseline Eval: R grip strength 66 lbs, L 68 (~20 below normal range for pt's age and gender)    Time 26    Period Weeks    Status New    Target Date 08/23/21                Plan - 07/14/21 0837     Clinical Impression Statement Pt is a 46 y/o male post hospitalization and inpatient rehab stay d/t progressive weakness related to uncontrolled T2DM and lumbar stenosis, fall with progressive worsening of balance and functional mobility.  Prior to hospitalization, pt was living a lone in a 2 story apartment and did not require AD.  Pt was on disability for hx of depression.  Pt is now requiring use of RW and staying with his brother until pt can return home.  Barriers to returning home are stairs to enter apartment building, and stairs to reach 2nd floor of apartment where bedroom and bathroom are, as well as manage IADLs, specifically meal preparation and laundry.  Pt has hx of BUE tremors, currently presents with weak grip, but otherwise BUEs are 5/5, impaired standing tolerance, impaired standing balance, all of which are limiting pt's indep with ADLs.  Pt will benefit from skilled OT to address deficits noted above; pt in agreement with goals and OT poc.    OT Occupational Profile and History Problem Focused Assessment - Including review of records relating to presenting problem    Occupational performance deficits (Please refer to evaluation for details): ADL's;IADL's    Body Structure / Function / Physical Skills ADL;Coordination;Endurance;GMC;UE functional use;Balance;Sensation;Body mechanics;Decreased knowledge of use of DME;IADL;Pain;Dexterity;FMC;Strength;Gait;Mobility    Rehab Potential Good    Clinical Decision Making Limited treatment options, no task modification necessary    Comorbidities Affecting Occupational  Performance: May have comorbidities impacting occupational performance    Modification or Assistance to Complete Evaluation  No  modification of tasks or assist necessary to complete eval    OT Frequency 2x / week    OT Duration 6 weeks    OT Treatment/Interventions Self-care/ADL training;Therapeutic exercise;DME and/or AE instruction;Functional Mobility Training;Balance training;Energy conservation;Therapeutic activities;Patient/family education    Consulted and Agree with Plan of Care Patient             Patient will benefit from skilled therapeutic intervention in order to improve the following deficits and impairments:   Body Structure / Function / Physical Skills: ADL, Coordination, Endurance, GMC, UE functional use, Balance, Sensation, Body mechanics, Decreased knowledge of use of DME, IADL, Pain, Dexterity, FMC, Strength, Gait, Mobility       Visit Diagnosis: Muscle weakness (generalized)  Other lack of coordination  Lumbar disc disorder with myelopathy    Problem List Patient Active Problem List   Diagnosis Date Noted   Lumbar disc disorder with myelopathy 06/01/2021   Unsteady gait 05/27/2021   Fall at home, initial encounter 05/27/2021   Lower extremity weakness 05/26/2021   Obesity (BMI 35.0-39.9 without comorbidity) 05/26/2021   Anxiety 09/12/2017   Bipolar disorder (HCC) 09/12/2017   Insulin dependent diabetes mellitus 09/12/2017   Primary insomnia 09/12/2017   Pure hypercholesterolemia 09/12/2017   Recurrent major depressive disorder, in partial remission (HCC) 09/12/2017   Tobacco dependence 09/12/2017   Vitamin D deficiency 09/12/2017   DM type 2 with diabetic peripheral neuropathy (HCC) 04/06/2016   Hypercalcemia 09/13/2015   Hypogonadism in male 09/13/2015   Non morbid obesity due to excess calories 09/13/2015   Danelle Earthly, MS, OTR/L  Otis Dials, OT 07/14/2021, 9:04 AM  [facility] MAIN [place]  SERVICES [address] [place], [place], [address] Phone: [phone]   Fax:  [phone]  Name: Jerry Wheeler MRN: [id number] Date of Birth: 08-10-1975

## 2021-07-17 ENCOUNTER — Ambulatory Visit: Payer: Medicare Other | Admitting: Physical Therapy

## 2021-07-17 ENCOUNTER — Encounter: Payer: Self-pay | Admitting: Physical Therapy

## 2021-07-17 ENCOUNTER — Ambulatory Visit: Payer: Medicare Other

## 2021-07-17 ENCOUNTER — Other Ambulatory Visit: Payer: Self-pay

## 2021-07-17 DIAGNOSIS — R262 Difficulty in walking, not elsewhere classified: Secondary | ICD-10-CM

## 2021-07-17 DIAGNOSIS — M5106 Intervertebral disc disorders with myelopathy, lumbar region: Secondary | ICD-10-CM

## 2021-07-17 DIAGNOSIS — M6281 Muscle weakness (generalized): Secondary | ICD-10-CM

## 2021-07-17 DIAGNOSIS — R2681 Unsteadiness on feet: Secondary | ICD-10-CM

## 2021-07-17 NOTE — Patient Instructions (Signed)
Access Code: [email or web address] URL: [email or web address] Date: 07/17/2021 Prepared by: Zettie Pho  Exercises Mini Squat with Counter Support - 1 x daily - 7 x weekly - 1 sets - 10 reps Standing Hip Abduction with Resistance at Ankles and Counter Support - 1 x daily - 7 x weekly - 1 sets - 10 reps Standing Hip Extension with Resistance at Ankles and Counter Support - 1 x daily - 7 x weekly - 1 sets - 10 reps Seated Heel Toe Raises - 1 x daily - 7 x weekly - 1 sets - 10 reps Seated Heel Raise - 2-3 x daily - 7 x weekly - 1 sets - 10 reps

## 2021-07-17 NOTE — Therapy (Signed)
[place] [facility] MAIN [facility] SERVICES [address] [place], [place], [address] Phone: [phone]   Fax:  [phone]  Physical Therapy Treatment  Patient Details  Name: Jerry Wheeler MRN: [id number] Date of Birth: Sep 02, 1975 Referring Provider (PT): Delle Reining PA/ Burnadette Pop PCP   Encounter Date: 07/17/2021   PT End of Session - 07/17/21 0934     Visit Number 2    Number of Visits 17    Date for PT Re-Evaluation 09/07/21    Authorization Type medicare    PT Start Time 0932    PT Stop Time 1015    PT Time Calculation (min) 43 min    Equipment Utilized During Treatment Gait belt    Activity Tolerance Patient limited by fatigue    Behavior During Therapy WFL for tasks assessed/performed             Past Medical History:  Diagnosis Date   Anxiety disorder    Bipolar disorder (HCC)    Diabetes mellitus (HCC)    Diabetic peripheral neuropathy associated with type 2 diabetes mellitus (HCC)    Emphysema lung (HCC)    hospitalization 1999   High blood pressure    Major depressive disorder in partial remission (HCC)    Morbid obesity (HCC)    Osteomyelitis of great toe of left foot (HCC)    Sleep apnea    Tobacco use disorder, continuous    Ulcer of right heel (HCC)    followed by podiatry--fully healed 09/2020    Past Surgical History:  Procedure Laterality Date   DG GREAT TOE LEFT FOOT  2010   s/p I and D for osteo    There were no vitals filed for this visit.   Subjective Assessment - 07/17/21 0934     Subjective Patient reports doing well. No pain; no new falls;    Pertinent History 46 yo Male admitted to the hospital 05/26/2021 - 06/01/2021 at [facility] with lower extremity weakness secondary to peripheral neuropathy and severe spinal stenosis seen on MRI on 05/26/2021. He was discharged to [facility] rehab 06/01/2021 - 06/09/2021. He was discharged to his brother's home. Prior to weakness he was in a 2 story apartment with stairs  to enter. Therefore he is currently living at his brothers home which is 2 story but living is on the main floor; he does have 4 steps to enter home with B railings; He reports navigating those steps well. He is currenlty ambulating with RW, able to take a few steps without walker. Reports falling on 12/9 prior to admittance but otherwise no new falls. He denies any significant numbness/tingling; He does have neuropathy in the feet from diabetes but he doesn't feel that it has worsened. He does have an appointment with Dr. Adriana Simas (neurosurgeon); He is not sure that they will recommend surgery due to uncontrolled diabetes and concern that surgery may not improve condition;    How long can you sit comfortably? several hours    How long can you stand comfortably? Unsure- able to stand at least 5 min but hasn't tested it.    How long can you walk comfortably? about 500 feet with fatigue, does use RW    Diagnostic tests MRI on 05/26/21 IMPRESSION:  1. Multilevel lumbar spondylosis, as detailed above.  2. Severe canal stenosis at L3-L4.  3. Moderate canal stenosis at L2-L3.  4. Mild-to-moderate canal stenosis with moderate-severe left  foraminal stenosis at L4-L5.    Patient Stated Goals "get back  into my apartment- negotiate steps, walk without walker"    Currently in Pain? No/denies    Pain Onset Today                [facility] PT Assessment - 07/17/21 0001       Standardized Balance Assessment   Standardized Balance Assessment Berg Balance Test      Berg Balance Test   Sit to Stand Able to stand  independently using hands    Standing Unsupported Able to stand 30 seconds unsupported    Sitting with Back Unsupported but Feet Supported on Floor or Stool Able to sit safely and securely 2 minutes    Stand to Sit Controls descent by using hands    Transfers Able to transfer safely, definite need of hands    Standing Unsupported with Eyes Closed Able to stand 3 seconds    Standing Unsupported with Feet  Together Able to place feet together independently but unable to hold for 30 seconds    From Standing, Reach Forward with Outstretched Arm Can reach forward >12 cm safely (5")    From Standing Position, Pick up Object from Floor Unable to pick up and needs supervision    From Standing Position, Turn to Look Behind Over each Shoulder Needs assist to keep from losing balance and falling    Turn 360 Degrees Needs assistance while turning    Standing Unsupported, Alternately Place Feet on Step/Stool Needs assistance to keep from falling or unable to try    Standing Unsupported, One Foot in [employer] Loses balance while stepping or standing    Standing on One Leg Tries to lift leg/unable to hold 3 seconds but remains standing independently    Total Score 24    Berg comment: <36/56 indicates 100% risk for falls             TREATMENT: Warm up on Nustep BUE/BLE level 2 x4 min (Unbilled)  Instructed patient in balance assessment to address fall risk; See above;  Standing with red tband around BLE: Hip abduction x10 reps each LE with RW for assistance with mod VCS to keep foot oriented forward to isolate hip abduction Hip extension x10 reps each LE with RW for assistance with cues to keep knee straight to isolate posterior hip for strengthening  Seated red tband around toes: -alternate DF x10 reps each LE, partial ROM throughout  Seated without band: -heel raises x10 reps;  Standing with RW supported: Mini squat 2 sec hold x10 reps with mod VCs for proper positioning  Provided written HEP for better adherence; Patient tolerated exercise well, denies any increase in back pain;   Instructed patient in advanced exercise: Standing on 1/2 bolster (flat side up) -Feet apart, heel/toe rock with mod VCs to keep knees straight, patient had moderate difficulty being unable to lift heel despite cues -feet apart, neutral position, unsupported 10 sec hold with intermittent rail assist, moderate  difficulty reported;  Patient instructed in stepping over orange hurdle, forward/backward x5 reps, side/side x5 reps with BUE rail assist, required cues to increase step length for better foot clearance. Occasionally stepped around obstacle to compensate for foot drop;  Patient reports moderate difficulty with exercise with increased fatigue. No increase in pain reported;                            PT Education - 07/17/21 0934     Education Details HEP/exercise technique    Person(s)  Educated Patient    Methods Explanation;Verbal cues    Comprehension Verbalized understanding;Returned demonstration;Verbal cues required;Need further instruction              PT Short Term Goals - 07/13/21 1548       PT SHORT TERM GOAL #1   Title Patient will be adherent to HEP at least 3x a week to improve functional strength and balance for better safety at home.    Time 4    Period Weeks    Status New    Target Date 08/10/21      PT SHORT TERM GOAL #2   Title Patient will demonstrate improvement in foot/ankle strength by being able to complete BLE heel raise with minimal UE support for better gait safety and mobility;    Time 4    Period Weeks    Status New    Target Date 08/10/21               PT Long Term Goals - 07/13/21 1550       PT LONG TERM GOAL #1   Title Patient will increase BLE gross strength to 4+/5  especially in hip as to improve functional strength for independent gait, increased standing tolerance and increased ADL ability.    Time 8    Period Weeks    Status New    Target Date 09/07/21      PT LONG TERM GOAL #2   Title Patient will increase six minute walk test distance to >800 for progression toward community ambulator and improve gait ability    Time 8    Period Weeks    Status New    Target Date 09/07/21      PT LONG TERM GOAL #3   Title Patient will demonstrate an improved Berg Balance Score of > 42/56 as to demonstrate improved  balance with ADLs such as sitting/standing and transfer balance and reduced fall risk.    Time 8    Period Weeks    Status New    Target Date 09/07/21      PT LONG TERM GOAL #4   Title Patient will reduce timed up and go to <14 seconds to reduce fall risk and demonstrate improved transfer/gait ability.    Time 8    Period Weeks    Status New    Target Date 09/07/21                   Plan - 07/17/21 1013     Clinical Impression Statement Patient motivated and participated well within session. He does test as a high risk for falls as evidenced with Berg Balance Assessment. Patient instructed in advanced HEP for LE strengthening. He does require min VCs for proper exercise technique/positioning. Patient reports moderate difficulty with exercise and exhibits minimal ankle ROM. Unsure if this is new onset weakness from lumbar impingement or if this is related to long standing neuropathy. Will continue to monitor. He required CGA to min A throughout with intermittent rail assist. He would benefit from additional skilled PT Intervention to improve strength, balance and mobility;    Personal Factors and Comorbidities Comorbidity 3+;Fitness;Time since onset of injury/illness/exacerbation    Comorbidities PMH Significant: Obesity, Anxiety/depression, bipolar disorder, Type 2 diabetes with neuropathy, insomnia, HTN, Lumbar stenosis    Examination-Activity Limitations Caring for Others;Carry;Locomotion Level;Squat;Stairs;Stand;Transfers    Examination-Participation Restrictions Cleaning;Community Activity;Driving;Occupation;Shop;Volunteer;Yard Work    Stability/Clinical Decision Making Stable/Uncomplicated    Rehab Potential Fair    PT  Frequency 2x / week    PT Duration 8 weeks    PT Treatment/Interventions ADLs/Self Care Home Management;Cryotherapy;Moist Heat;Gait training;Stair training;Functional mobility training;Therapeutic activities;Therapeutic exercise;Balance training;Neuromuscular  re-education;Patient/family education;Orthotic Fit/Training;Manual techniques    PT Next Visit Plan Continue with strength/balance    PT Home Exercise Plan Initiated;    Consulted and Agree with Plan of Care Patient             Patient will benefit from skilled therapeutic intervention in order to improve the following deficits and impairments:  Abnormal gait, Decreased balance, Decreased endurance, Decreased mobility, Difficulty walking, Impaired sensation, Obesity, Increased edema, Decreased activity tolerance, Decreased safety awareness, Decreased strength  Visit Diagnosis: Muscle weakness (generalized)  Unsteadiness on feet  Difficulty in walking, not elsewhere classified     Problem List Patient Active Problem List   Diagnosis Date Noted   Lumbar disc disorder with myelopathy 06/01/2021   Unsteady gait 05/27/2021   Fall at home, initial encounter 05/27/2021   Lower extremity weakness 05/26/2021   Obesity (BMI 35.0-39.9 without comorbidity) 05/26/2021   Anxiety 09/12/2017   Bipolar disorder (HCC) 09/12/2017   Insulin dependent diabetes mellitus 09/12/2017   Primary insomnia 09/12/2017   Pure hypercholesterolemia 09/12/2017   Recurrent major depressive disorder, in partial remission (HCC) 09/12/2017   Tobacco dependence 09/12/2017   Vitamin D deficiency 09/12/2017   DM type 2 with diabetic peripheral neuropathy (HCC) 04/06/2016   Hypercalcemia 09/13/2015   Hypogonadism in male 09/13/2015   Non morbid obesity due to excess calories 09/13/2015    Rayssa Atha, PT, DPT 07/17/2021, 10:18 AM  [place] [facility] [address] , [place], [address] Phone: [phone]   Fax:  [phone]  Name: Jerry Wheeler MRN: [id number] Date of Birth: 07-16-1975

## 2021-07-17 NOTE — Therapy (Signed)
[place] [facility] MAIN [facility] [address] [place], [place], [address] Phone: [phone]   Fax:  [phone]  Occupational Therapy Treatment  Patient Details  Name: Jerry Wheeler MRN: [id number] Date of Birth: 11-29-1975 Referring Provider (OT): Delle ReiningPamela Love, [place]   Encounter Date: 07/17/2021   OT End of Session - 07/17/21 0844     Visit Number 2    Number of Visits 12    Date for OT Re-Evaluation 08/23/21    OT Start Time 0830    OT Stop Time 0915    OT Time Calculation (min) 45 min    Equipment Utilized During Treatment RW    Activity Tolerance Patient tolerated treatment well    Behavior During Therapy [facility] for tasks assessed/performed             Past Medical History:  Diagnosis Date   Anxiety disorder    Bipolar disorder (HCC)    Diabetes mellitus (HCC)    Diabetic peripheral neuropathy associated with type 2 diabetes mellitus (HCC)    Emphysema lung (HCC)    hospitalization 1999   High blood pressure    Major depressive disorder in partial remission (HCC)    Morbid obesity (HCC)    Osteomyelitis of great toe of left foot (HCC)    Sleep apnea    Tobacco use disorder, continuous    Ulcer of right heel (HCC)    followed by podiatry--fully healed 09/2020    Past Surgical History:  Procedure Laterality Date   DG GREAT TOE LEFT FOOT  2010   s/p I and D for osteo    There were no vitals filed for this visit.   Subjective Assessment - 07/17/21 0842     Subjective  "No pain today."    Pertinent History poorly controlled T2DM with peripheral neuropathy, bipolar disorder, OSA who [facility] with worsening of BLE weakness and  numbness for 3 weeks progressing to inability to walk x one day. Neurology consulted and MRI spine done revealing severe canal stenosis L3/L4, moderate canal stenosis L2/L3 and mild to moderate canal stenosis with  moderate to severe left foraminal stenosis at L4/L5.     Limitations Muscle weakness, mild BUE tremors, impaired balance and gait, hx of falling, neuropathy in BLEs    Patient Stated Goals "I want to improve my balance so I can safely be in my kitchen to cook."    Currently in Pain? No/denies    Pain Score 0-No pain    Pain Onset Today            Occupational Therapy Treatment: Therapeutic Exercise: Facilitated hand strengthening with use of hand gripper set at 23.4# to remove jumbo pegs from pegboard x3 trials each hand.  Pt completed 1 trial for each hand standing to challenge dynamic standing balance with sitting rest break between set up of pegs and between R/L sides.  Completed remaining 2 sets in sitting to allow LE rest.  In standing pt required intermittent support of the opposite arm on table to maintain balance.  Intermittent cues for squeezing buttocks to avoid hip flexion in standing.  Practiced functional reaching with use of RW to retrieve cones placed at various surface levels around the room.  Pt verbalizes forward and lower level reaching are more challenging, requiring min guard for stability.   Neuro re-ed: Challenged dynamic standing balance with use of Saebo tower, progressively increasing challenge with each set.  Pt started in standing reaching  within BOS for rings, then worked forward reach slightly outside BOS.  Transitioned to lateral reaching just outside BOS.  Completed all 3 positions with a set for each arm.  Pt requiring CGA throughout with intermittent support of 1 hand on RW to maintain or correct balance while weight shifting.  Pt required seated rest breaks after each tower placement and each arm set.    Response to Treatment: Pt tolerated tx well with frequent rest breaks.  OT issued green theraputty and instructed in gross grasping and pinching exercises, encouraging pt to use 5-10 min, 1-2x per day to increase hand strength for self care.  Good return demo.  Challenged dynamic standing balance this day, with pt  requiring intermittent min guard and intermittent 1 hand on support surface while reaching within BOS, and consistent min guard-min A when reaching slightly outside BOS in standing.  Pt verbalizes forward, lateral, and lower level reaching are more challenging than over the shoulder level reaching, requiring min guard for stability. Pt is working towards improved stability to enable return to meal prep/kitchen mobility.  Currently brother is managing larger meals.  Pt will continue to benefit from skilled OT to improve dynamic standing balance and grip strength for improved performance with daily tasks.    OT Education - 07/17/21 0843     Education Details Standing posture    Person(s) Educated Patient    Methods Explanation;Verbal cues;Demonstration;Tactile cues    Comprehension Verbalized understanding;Verbal cues required;Returned demonstration;Need further instruction              OT Short Term Goals - 07/14/21 2951       OT SHORT TERM GOAL #1   Title Pt will verbalize and demonstrate 3 fall prevention strategies to reduce fall risk with ADLs/IADLs.    Baseline Eval: not yet initiated    Time 3    Period Weeks    Status New    Target Date 08/02/21      OT SHORT TERM GOAL #2   Title Pt will verbalize 3 compensatory strategies to manage BUE tremors during ADL completion.    Baseline Eval: education needed    Time 3    Period Weeks    Status New    Target Date 08/02/21               OT Long Term Goals - 07/14/21 0841       OT LONG TERM GOAL #1   Title Pt will increase FOTO score to 60 or better to indicate increased functional performance.    Baseline Eval: FOTO 51.    Time 6    Period Weeks    Status New    Target Date 08/23/21      OT LONG TERM GOAL #2   Title Pt will increase functional reach to >10" without UE support to decrease fall risk with functional reaching during daily tasks.    Baseline Eval: Functional reach 9" without AD, 13" with 1 hand on  support surface.    Time 6    Period Weeks    Status New    Target Date 08/25/21      OT LONG TERM GOAL #3   Title Pt will increase 360 degree turn test score to WNL (4 secs or less) to indicate improved dynamic standing/decreased fall risk within small spaces for kitchen mobility.    Baseline Eval: 360 turn test score 7 sec with RW    Time 6    Period Weeks  Status New    Target Date 08/23/21      OT LONG TERM GOAL #4   Title Pt will be modified indep to perform hot meal prep at stove top.    Baseline Eval: Dep for hot meal prep, using microwave only and preparing cold meals d/t limited ability to release hands from walker.    Time 6    Period Weeks    Status New    Target Date 08/23/21      OT LONG TERM GOAL #5   Title Pt will manage laundry tasks with modified indep.    Baseline Eval: max A; brother currently managing pt's laundry    Time 6    Period Weeks    Status New    Target Date 08/23/21      Long Term Additional Goals   Additional Long Term Goals Yes      OT LONG TERM GOAL #6   Title Pt will increase bilat grip strength by 10 or more lbs to improve ability to hold and carry ADL supplies.    Baseline Eval: R grip strength 66 lbs, L 68 (~20 below normal range for pt's age and gender)    Time 51    Period Weeks    Status New    Target Date 08/23/21              Plan - 07/17/21 1308     Clinical Impression Statement Pt tolerated tx well with frequent rest breaks.  OT issued green theraputty and instructed in gross grasping and pinching exercises, encouraging pt to use 5-10 min, 1-2x per day to increase hand strength for self care.  Good return demo.  Challenged dynamic standing balance this day, with pt requiring intermittent min guard and intermittent 1 hand on support surface while reaching within BOS, and consistent min guard-min A when reaching slightly outside BOS in standing.  Pt verbalizes forward, lateral, and lower level reaching are more challenging  than over the shoulder level reaching, requiring min guard for stability. Pt is working towards improved stability to enable return to meal prep/kitchen mobility.  Currently brother is managing larger meals.  Pt will continue to benefit from skilled OT to improve dynamic standing balance and grip strength for improved performance with daily tasks.    OT Occupational Profile and History Problem Focused Assessment - Including review of records relating to presenting problem    Occupational performance deficits (Please refer to evaluation for details): ADL's;IADL's    Body Structure / Function / Physical Skills ADL;Coordination;Endurance;GMC;UE functional use;Balance;Sensation;Body mechanics;Decreased knowledge of use of DME;IADL;Pain;Dexterity;FMC;Strength;Gait;Mobility    Rehab Potential Good    Clinical Decision Making Limited treatment options, no task modification necessary    Comorbidities Affecting Occupational Performance: May have comorbidities impacting occupational performance    Modification or Assistance to Complete Evaluation  No modification of tasks or assist necessary to complete eval    OT Frequency 2x / week    OT Duration 6 weeks    OT Treatment/Interventions Self-care/ADL training;Therapeutic exercise;DME and/or AE instruction;Functional Mobility Training;Balance training;Energy conservation;Therapeutic activities;Patient/family education    Consulted and Agree with Plan of Care Patient             Patient will benefit from skilled therapeutic intervention in order to improve the following deficits and impairments:   Body Structure / Function / Physical Skills: ADL, Coordination, Endurance, GMC, UE functional use, Balance, Sensation, Body mechanics, Decreased knowledge of use of DME, IADL, Pain, Dexterity, FMC, Strength, Gait,  Mobility       Visit Diagnosis: Muscle weakness (generalized)  Lumbar disc disorder with myelopathy    Problem List Patient Active Problem  List   Diagnosis Date Noted   Lumbar disc disorder with myelopathy 06/01/2021   Unsteady gait 05/27/2021   Fall at home, initial encounter 05/27/2021   Lower extremity weakness 05/26/2021   Obesity (BMI 35.0-39.9 without comorbidity) 05/26/2021   Anxiety 09/12/2017   Bipolar disorder (HCC) 09/12/2017   Insulin dependent diabetes mellitus 09/12/2017   Primary insomnia 09/12/2017   Pure hypercholesterolemia 09/12/2017   Recurrent major depressive disorder, in partial remission (HCC) 09/12/2017   Tobacco dependence 09/12/2017   Vitamin D deficiency 09/12/2017   DM type 2 with diabetic peripheral neuropathy (HCC) 04/06/2016   Hypercalcemia 09/13/2015   Hypogonadism in male 09/13/2015   Non morbid obesity due to excess calories 09/13/2015   Danelle EarthlyNancy Lillieanna Tuohy, MS, OTR/L  Otis DialsNancy K Adaijah Endres, OT 07/17/2021, 1:09 PM  [place] [facility] [address] [place], [place], [address] Phone: [phone]   Fax:  [phone]  Name: Jerry Wheeler MRN: [id number] Date of Birth: [birth year]

## 2021-07-20 ENCOUNTER — Ambulatory Visit: Payer: Medicare Other

## 2021-07-20 ENCOUNTER — Other Ambulatory Visit: Payer: Self-pay

## 2021-07-20 ENCOUNTER — Encounter: Payer: Self-pay | Admitting: Physical Therapy

## 2021-07-20 ENCOUNTER — Ambulatory Visit: Payer: Medicare Other | Attending: Physical Medicine and Rehabilitation | Admitting: Physical Therapy

## 2021-07-20 DIAGNOSIS — M5106 Intervertebral disc disorders with myelopathy, lumbar region: Secondary | ICD-10-CM | POA: Diagnosis present

## 2021-07-20 DIAGNOSIS — M6281 Muscle weakness (generalized): Secondary | ICD-10-CM | POA: Diagnosis not present

## 2021-07-20 DIAGNOSIS — R262 Difficulty in walking, not elsewhere classified: Secondary | ICD-10-CM | POA: Insufficient documentation

## 2021-07-20 DIAGNOSIS — R278 Other lack of coordination: Secondary | ICD-10-CM

## 2021-07-20 DIAGNOSIS — R2689 Other abnormalities of gait and mobility: Secondary | ICD-10-CM | POA: Diagnosis present

## 2021-07-20 DIAGNOSIS — R2681 Unsteadiness on feet: Secondary | ICD-10-CM | POA: Insufficient documentation

## 2021-07-20 NOTE — Therapy (Signed)
[facility] [address] [place], [place], [address] Phone: [phone]   Fax:  [phone]  Occupational Therapy Treatment  Patient Details  Name: Jerry Wheeler MRN: [id number] Date of Birth: 11-01-1975 Referring Provider (OT): Delle ReiningPamela Love, [place]   Encounter Date: 07/20/2021   OT End of Session - 07/20/21 1609     Visit Number 3    Number of Visits 12    Date for OT Re-Evaluation 08/23/21    OT Start Time 1515    OT Stop Time 1556    OT Time Calculation (min) 41 min    Equipment Utilized During Treatment RW    Activity Tolerance Patient tolerated treatment well    Behavior During Therapy WFL for tasks assessed/performed             Past Medical History:  Diagnosis Date   Anxiety disorder    Bipolar disorder (HCC)    Diabetes mellitus (HCC)    Diabetic peripheral neuropathy associated with type 2 diabetes mellitus (HCC)    Emphysema lung (HCC)    hospitalization 1999   High blood pressure    Major depressive disorder in partial remission (HCC)    Morbid obesity (HCC)    Osteomyelitis of great toe of left foot (HCC)    Sleep apnea    Tobacco use disorder, continuous    Ulcer of right heel (HCC)    followed by podiatry--fully healed 09/2020    Past Surgical History:  Procedure Laterality Date   DG GREAT TOE LEFT FOOT  2010   s/p I and D for osteo    There were no vitals filed for this visit.   Subjective Assessment - 07/20/21 1607     Subjective  "I've been busy this week."    Pertinent History poorly controlled T2DM with peripheral neuropathy, bipolar disorder, OSA who [facility] with worsening of BLE weakness and  numbness for 3 weeks progressing to inability to walk x one day. Neurology consulted and MRI spine done revealing severe canal stenosis L3/L4, moderate canal stenosis L2/L3 and mild to moderate canal stenosis with  moderate to severe left foraminal stenosis at L4/L5.     Limitations Muscle weakness, mild BUE tremors, impaired balance and gait, hx of falling, neuropathy in BLEs    Patient Stated Goals "I want to improve my balance so I can safely be in my kitchen to cook."    Currently in Pain? No/denies    Pain Score 0-No pain    Pain Onset Yesterday             Occupational Therapy Treatment: Therapeutic Exercise: Used hand gripper at moderate resistance to perform grip squeezes x5 sets 10 reps each hand, working to increase grip to hold and carry ADL supplies.  Facilitated pinch strengthening with use of therapy resistant clothespins to target lateral and 3 point pinch of R/L hands, all colors with each hand for each pinch type.    Neuro re-ed: Targeted dynamic standing balance standing at high table to reach 1" outside BOS forward and laterally R/L to place washers over vertical dowel, with goal to perform without UE support.     Response to Treatment: See Plan/clinical impression below.     OT Education - 07/20/21 1608     Education Details Standing posture with and without walker    Person(s) Educated Patient    Methods Explanation;Verbal cues;Demonstration;Tactile cues    Comprehension Verbalized understanding;Verbal cues required;Returned demonstration;Need  further instruction              OT Short Term Goals - 07/14/21 5621       OT SHORT TERM GOAL #1   Title Pt will verbalize and demonstrate 3 fall prevention strategies to reduce fall risk with ADLs/IADLs.    Baseline Eval: not yet initiated    Time 3    Period Weeks    Status New    Target Date 08/02/21      OT SHORT TERM GOAL #2   Title Pt will verbalize 3 compensatory strategies to manage BUE tremors during ADL completion.    Baseline Eval: education needed    Time 3    Period Weeks    Status New    Target Date 08/02/21               OT Long Term Goals - 07/14/21 0841       OT LONG TERM GOAL #1   Title Pt will increase FOTO score to 60 or better to  indicate increased functional performance.    Baseline Eval: FOTO 51.    Time 6    Period Weeks    Status New    Target Date 08/23/21      OT LONG TERM GOAL #2   Title Pt will increase functional reach to >10" without UE support to decrease fall risk with functional reaching during daily tasks.    Baseline Eval: Functional reach 9" without AD, 13" with 1 hand on support surface.    Time 6    Period Weeks    Status New    Target Date 08/25/21      OT LONG TERM GOAL #3   Title Pt will increase 360 degree turn test score to WNL (4 secs or less) to indicate improved dynamic standing/decreased fall risk within small spaces for kitchen mobility.    Baseline Eval: 360 turn test score 7 sec with RW    Time 6    Period Weeks    Status New    Target Date 08/23/21      OT LONG TERM GOAL #4   Title Pt will be modified indep to perform hot meal prep at stove top.    Baseline Eval: Dep for hot meal prep, using microwave only and preparing cold meals d/t limited ability to release hands from walker.    Time 6    Period Weeks    Status New    Target Date 08/23/21      OT LONG TERM GOAL #5   Title Pt will manage laundry tasks with modified indep.    Baseline Eval: max A; brother currently managing pt's laundry    Time 6    Period Weeks    Status New    Target Date 08/23/21      Long Term Additional Goals   Additional Long Term Goals Yes      OT LONG TERM GOAL #6   Title Pt will increase bilat grip strength by 10 or more lbs to improve ability to hold and carry ADL supplies.    Baseline Eval: R grip strength 66 lbs, L 68 (~20 below normal range for pt's age and gender)    Time 23    Period Weeks    Status New    Target Date 08/23/21              Plan - 07/20/21 1625     Clinical Impression Statement Pt reporting  significant LE fatigue today after PT visit.  Began with hand strengthening in sitting with good tolerance.  Transitioned to dynamic standing tasks with pt requiring  frequent sitting breaks and vc for squeezing buttocks and quads for increased stability to prevent buckling.  Pt required occasional unilateral support of hand on table top.  Reinforced good standing posture with shoulders back and buttocks tucked in, encouraging pt to limit pressure of hands on walker to the best of his ability.  Pt will continue to benefit from skilled OT to improve dynamic standing balance and grip strength for improved performance with daily tasks.    OT Occupational Profile and History Problem Focused Assessment - Including review of records relating to presenting problem    Occupational performance deficits (Please refer to evaluation for details): ADL's;IADL's    Body Structure / Function / Physical Skills ADL;Coordination;Endurance;GMC;UE functional use;Balance;Sensation;Body mechanics;Decreased knowledge of use of DME;IADL;Pain;Dexterity;FMC;Strength;Gait;Mobility    Rehab Potential Good    Clinical Decision Making Limited treatment options, no task modification necessary    Comorbidities Affecting Occupational Performance: May have comorbidities impacting occupational performance    Modification or Assistance to Complete Evaluation  No modification of tasks or assist necessary to complete eval    OT Frequency 2x / week    OT Duration 6 weeks    OT Treatment/Interventions Self-care/ADL training;Therapeutic exercise;DME and/or AE instruction;Functional Mobility Training;Balance training;Energy conservation;Therapeutic activities;Patient/family education    Consulted and Agree with Plan of Care Patient             Patient will benefit from skilled therapeutic intervention in order to improve the following deficits and impairments:   Body Structure / Function / Physical Skills: ADL, Coordination, Endurance, GMC, UE functional use, Balance, Sensation, Body mechanics, Decreased knowledge of use of DME, IADL, Pain, Dexterity, FMC, Strength, Gait, Mobility       Visit  Diagnosis: Muscle weakness (generalized)  Other lack of coordination    Problem List Patient Active Problem List   Diagnosis Date Noted   Lumbar disc disorder with myelopathy 06/01/2021   Unsteady gait 05/27/2021   Fall at home, initial encounter 05/27/2021   Lower extremity weakness 05/26/2021   Obesity (BMI 35.0-39.9 without comorbidity) 05/26/2021   Anxiety 09/12/2017   Bipolar disorder (HCC) 09/12/2017   Insulin dependent diabetes mellitus 09/12/2017   Primary insomnia 09/12/2017   Pure hypercholesterolemia 09/12/2017   Recurrent major depressive disorder, in partial remission (HCC) 09/12/2017   Tobacco dependence 09/12/2017   Vitamin D deficiency 09/12/2017   DM type 2 with diabetic peripheral neuropathy (HCC) 04/06/2016   Hypercalcemia 09/13/2015   Hypogonadism in male 09/13/2015   Non morbid obesity due to excess calories 09/13/2015   Danelle Earthly, MS, OTR/L  [employer] Dials, OT 07/20/2021, 4:25 PM  [facility] MAIN [facility] SERVICES [address] [place], [place], [address] Phone: [phone]   Fax:  [phone]  Name: Jerry Wheeler MRN: [id number] Date of Birth: 1975-12-09

## 2021-07-20 NOTE — Therapy (Signed)
[place] [facility] MAIN [facility] SERVICES [address] [place], [place], [address] Phone: [phone]   Fax:  [phone]  Physical Therapy Treatment  Patient Details  Name: Jerry Wheeler MRN: [id number] Date of Birth: 05/15/1976 Referring Provider (PT): Delle Reining PA/ Burnadette Pop PCP   Encounter Date: 07/20/2021   PT End of Session - 07/20/21 1443     Visit Number 3    Number of Visits 17    Date for PT Re-Evaluation 09/07/21    Authorization Type medicare    PT Start Time 1432    PT Stop Time 1515    PT Time Calculation (min) 43 min    Equipment Utilized During Treatment Gait belt    Activity Tolerance Patient limited by fatigue    Behavior During Therapy WFL for tasks assessed/performed             Past Medical History:  Diagnosis Date   Anxiety disorder    Bipolar disorder (HCC)    Diabetes mellitus (HCC)    Diabetic peripheral neuropathy associated with type 2 diabetes mellitus (HCC)    Emphysema lung (HCC)    hospitalization 1999   High blood pressure    Major depressive disorder in partial remission (HCC)    Morbid obesity (HCC)    Osteomyelitis of great toe of left foot (HCC)    Sleep apnea    Tobacco use disorder, continuous    Ulcer of right heel (HCC)    followed by podiatry--fully healed 09/2020    Past Surgical History:  Procedure Laterality Date   DG GREAT TOE LEFT FOOT  2010   s/p I and D for osteo    There were no vitals filed for this visit.   Subjective Assessment - 07/20/21 1438     Subjective Patient reports no pain currently but does have some soreness in posterior thigh; No new falls/stumbles;    Pertinent History 46 yo Male admitted to the hospital 05/26/2021 - 06/01/2021 at [facility] with lower extremity weakness secondary to peripheral neuropathy and severe spinal stenosis seen on MRI on 05/26/2021. He was discharged to [facility] rehab 06/01/2021 - 06/09/2021. He was discharged to his brother's home. Prior  to weakness he was in a 2 story apartment with stairs to enter. Therefore he is currently living at his brothers home which is 2 story but living is on the main floor; he does have 4 steps to enter home with B railings; He reports navigating those steps well. He is currenlty ambulating with RW, able to take a few steps without walker. Reports falling on 12/9 prior to admittance but otherwise no new falls. He denies any significant numbness/tingling; He does have neuropathy in the feet from diabetes but he doesn't feel that it has worsened. He does have an appointment with Dr. Adriana Simas (neurosurgeon); He is not sure that they will recommend surgery due to uncontrolled diabetes and concern that surgery may not improve condition;    How long can you sit comfortably? several hours    How long can you stand comfortably? Unsure- able to stand at least 5 min but hasn't tested it.    How long can you walk comfortably? about 500 feet with fatigue, does use RW    Diagnostic tests MRI on 05/26/21 IMPRESSION:  1. Multilevel lumbar spondylosis, as detailed above.  2. Severe canal stenosis at L3-L4.  3. Moderate canal stenosis at L2-L3.  4. Mild-to-moderate canal stenosis with moderate-severe left  foraminal stenosis at L4-L5.  Patient Stated Goals "get back into my apartment- negotiate steps, walk without walker"    Currently in Pain? Yes    Pain Score 3     Pain Location Leg    Pain Orientation Right;Left;Posterior    Pain Descriptors / Indicators Aching;Sore;Tightness    Pain Type Acute pain    Pain Onset Yesterday    Pain Frequency Intermittent    Pain Relieving Factors movement/stretch    Effect of Pain on Daily Activities decreased activity tolerance;                      TREATMENT: Warm up on cross trainer level 2-3 x4 min (Unbilled) Required min A to get on/off machine;   Seated hamstring curl red tband x10 reps each LE, moderate difficulty; Seated with green tband BUE low row x15 reps  with cues for erect posture Seated LAQ with ankle DF, 3# x10 reps each LE, 3 sec hold; patient reports minimal difficulty;  Sit<>Stand from elevated mat table x5 reps holding small ball in BUE to challenge LE weight bearing and increase challenge in LE;  Standing in parallel bars: -forward/backward step over 1/2 bolster x10 reps with 2-1 rail assist -side stepping over 1/2 bolster x10 reps with 2-1 rail assist Patient does fatigue quickly with increased shortness of breath, vitals assessed: HR 119, SpO2 99% Forward/backward walking in parallel bars: with intermittent finger tip assist on railing x4 laps with mod VCs to keep feet apart for better sequencing and gait safety;    Seated heel/toe raises x15 reps, partial ROM     Patient reports moderate difficulty with exercise with increased fatigue and shortness of breath, vitals are WNL. No increase in pain reported;   He reports less soreness after exercise. Reinforced importance of HEP adherence;                     PT Education - 07/20/21 1443     Education Details HEP/exercise technique    Person(s) Educated Patient    Methods Explanation;Verbal cues    Comprehension Verbalized understanding;Returned demonstration;Verbal cues required;Need further instruction              PT Short Term Goals - 07/13/21 1548       PT SHORT TERM GOAL #1   Title Patient will be adherent to HEP at least 3x a week to improve functional strength and balance for better safety at home.    Time 4    Period Weeks    Status New    Target Date 08/10/21      PT SHORT TERM GOAL #2   Title Patient will demonstrate improvement in foot/ankle strength by being able to complete BLE heel raise with minimal UE support for better gait safety and mobility;    Time 4    Period Weeks    Status New    Target Date 08/10/21               PT Long Term Goals - 07/13/21 1550       PT LONG TERM GOAL #1   Title Patient will increase BLE  gross strength to 4+/5  especially in hip as to improve functional strength for independent gait, increased standing tolerance and increased ADL ability.    Time 8    Period Weeks    Status New    Target Date 09/07/21      PT LONG TERM GOAL #2   Title Patient will  increase six minute walk test distance to >800 for progression toward community ambulator and improve gait ability    Time 8    Period Weeks    Status New    Target Date 09/07/21      PT LONG TERM GOAL #3   Title Patient will demonstrate an improved Berg Balance Score of > 42/56 as to demonstrate improved balance with ADLs such as sitting/standing and transfer balance and reduced fall risk.    Time 8    Period Weeks    Status New    Target Date 09/07/21      PT LONG TERM GOAL #4   Title Patient will reduce timed up and go to <14 seconds to reduce fall risk and demonstrate improved transfer/gait ability.    Time 8    Period Weeks    Status New    Target Date 09/07/21                   Plan - 07/20/21 1522     Clinical Impression Statement Patient motivated and participated well within session. he was instructed in advanced LE strengthening exercise. Patient does require min VCs for proper exercise technique and positioning. He does exhibit weakness in BUE/BLE which limits exercise tolerance. Patient does become short of breath with minimal exertion requiring short seated rest breaks. Vitals assessed, HR elevated but to be expected with exercise performed. Patient would benefit from additional skilled PT Intervention to improve strength, balance and mobility;    Personal Factors and Comorbidities Comorbidity 3+;Fitness;Time since onset of injury/illness/exacerbation    Comorbidities PMH Significant: Obesity, Anxiety/depression, bipolar disorder, Type 2 diabetes with neuropathy, insomnia, HTN, Lumbar stenosis    Examination-Activity Limitations Caring for Others;Carry;Locomotion Level;Squat;Stairs;Stand;Transfers     Examination-Participation Restrictions Cleaning;Community Activity;Driving;Occupation;Shop;Volunteer;Yard Work    Stability/Clinical Decision Making Stable/Uncomplicated    Rehab Potential Fair    PT Frequency 2x / week    PT Duration 8 weeks    PT Treatment/Interventions ADLs/Self Care Home Management;Cryotherapy;Moist Heat;Gait training;Stair training;Functional mobility training;Therapeutic activities;Therapeutic exercise;Balance training;Neuromuscular re-education;Patient/family education;Orthotic Fit/Training;Manual techniques    PT Next Visit Plan Continue with strength/balance    PT Home Exercise Plan Initiated;    Consulted and Agree with Plan of Care Patient             Patient will benefit from skilled therapeutic intervention in order to improve the following deficits and impairments:  Abnormal gait, Decreased balance, Decreased endurance, Decreased mobility, Difficulty walking, Impaired sensation, Obesity, Increased edema, Decreased activity tolerance, Decreased safety awareness, Decreased strength  Visit Diagnosis: Muscle weakness (generalized)  Unsteadiness on feet  Difficulty in walking, not elsewhere classified     Problem List Patient Active Problem List   Diagnosis Date Noted   Lumbar disc disorder with myelopathy 06/01/2021   Unsteady gait 05/27/2021   Fall at home, initial encounter 05/27/2021   Lower extremity weakness 05/26/2021   Obesity (BMI 35.0-39.9 without comorbidity) 05/26/2021   Anxiety 09/12/2017   Bipolar disorder (HCC) 09/12/2017   Insulin dependent diabetes mellitus 09/12/2017   Primary insomnia 09/12/2017   Pure hypercholesterolemia 09/12/2017   Recurrent major depressive disorder, in partial remission (HCC) 09/12/2017   Tobacco dependence 09/12/2017   Vitamin D deficiency 09/12/2017   DM type 2 with diabetic peripheral neuropathy (HCC) 04/06/2016   Hypercalcemia 09/13/2015   Hypogonadism in male 09/13/2015   Non morbid obesity due to  excess calories 09/13/2015    Jerry Wheeler, PT, DPT 07/20/2021, 3:30 PM  [facility] MA[facility] [address] [place], [place], [address] Phone: [phone]   Fax:  [phone]  Name: Jerry GamblerJonathan Wheeler MRN: [id number] Date of Birth: 1975-08-28

## 2021-07-25 ENCOUNTER — Encounter: Payer: Self-pay | Admitting: Occupational Therapy

## 2021-07-25 ENCOUNTER — Other Ambulatory Visit: Payer: Self-pay

## 2021-07-25 ENCOUNTER — Ambulatory Visit: Payer: Medicare Other | Admitting: Occupational Therapy

## 2021-07-25 ENCOUNTER — Ambulatory Visit: Payer: Medicare Other

## 2021-07-25 DIAGNOSIS — R2681 Unsteadiness on feet: Secondary | ICD-10-CM

## 2021-07-25 DIAGNOSIS — R262 Difficulty in walking, not elsewhere classified: Secondary | ICD-10-CM

## 2021-07-25 DIAGNOSIS — M6281 Muscle weakness (generalized): Secondary | ICD-10-CM

## 2021-07-25 NOTE — Therapy (Signed)
[place] [facility] MAIN [facility] SERVICES [address] [place], [place], [address] Phone: [phone]   Fax:  [phone]  Occupational Therapy Treatment  Patient Details  Name: Jerry Wheeler MRN: [id number] Date of Birth: 28-Sep-1975 Referring Provider (OT): Delle Reining, [place]   Encounter Date: 07/25/2021   OT End of Session - 07/25/21 1706     Visit Number 4    Number of Visits 12    Date for OT Re-Evaluation 08/23/21    OT Start Time 1430    OT Stop Time 1515    OT Time Calculation (min) 45 min    Activity Tolerance Patient tolerated treatment well    Behavior During Therapy [facility] for tasks assessed/performed             Past Medical History:  Diagnosis Date   Anxiety disorder    Bipolar disorder (HCC)    Diabetes mellitus (HCC)    Diabetic peripheral neuropathy associated with type 2 diabetes mellitus (HCC)    Emphysema lung (HCC)    hospitalization 1999   High blood pressure    Major depressive disorder in partial remission (HCC)    Morbid obesity (HCC)    Osteomyelitis of great toe of left foot (HCC)    Sleep apnea    Tobacco use disorder, continuous    Ulcer of right heel (HCC)    followed by podiatry--fully healed 09/2020    Past Surgical History:  Procedure Laterality Date   DG GREAT TOE LEFT FOOT  2010   s/p I and D for osteo    There were no vitals filed for this visit.   Subjective Assessment - 07/25/21 1703     Subjective  Pt. reports doing well today    Patient is accompanied by: Family member    Pertinent History poorly controlled T2DM with peripheral neuropathy, bipolar disorder, OSA who was admitted to [facility] on 05/26/21 with worsening of BLE weakness and  numbness for 3 weeks progressing to inability to walk x one day. Neurology consulted and MRI spine done revealing severe canal stenosis L3/L4, moderate canal stenosis L2/L3 and mild to moderate canal stenosis with  moderate to severe left foraminal stenosis at  L4/L5.    Patient Stated Goals "I want to improve my balance so I can safely be in my kitchen to cook."    Currently in Pain? No/denies            OT TREATMENT    Therapeutic Exercise:  Pt. performed right gross gripping with a gross grip strengthener while in standing. Pt. worked on sustaining grip while grasping pegs and reaching up, and  at various heights. The Gripper was set to 17.9 # of grip strength resistance. Pt. worked on RUE ROM with reaching using the shape tower to move the shapes through 4 vertical rungs of progressively increasing heights while in standing. Pt. Worked on BUE strengthening, and reciprocal motion using the UBE while seated for 8 min. with no resistance. Constant monitoring was provided.   Self-care:  Pt. worked on standing to fold linens. Pt. worked on folding half sized towels, and full sized towels. Pt. worked on maintaining the unsupported balance needed to use both hands to fold the linens. Pt. worked on worked on reaching up to [employer] in [employer], place a Mining engineer, and place items on higher shelves in the closet.  Pt. tolerated the treatment session well. Pt. Did require multiple rest breaks during standing tasks. Pt. presented  with episodes of unsteadiness, and retropulsion, however was able to self correct 100% of the time. Pt. Presented with increased unsteadiness when performing tasks requiring the use of his BUEs during folding tasks, and when attempting to place a shirt onto a hanger before placing the hanger into the closet.                      OT Education - 07/25/21 1705     Education Details Standing posture with and without walker    Person(s) Educated Patient    Methods Explanation;Verbal cues;Demonstration;Tactile cues    Comprehension Verbalized understanding;Verbal cues required;Returned demonstration;Need further instruction              OT Short Term Goals - 07/14/21 7253       OT SHORT  TERM GOAL #1   Title Pt will verbalize and demonstrate 3 fall prevention strategies to reduce fall risk with ADLs/IADLs.    Baseline Eval: not yet initiated    Time 3    Period Weeks    Status New    Target Date 08/02/21      OT SHORT TERM GOAL #2   Title Pt will verbalize 3 compensatory strategies to manage BUE tremors during ADL completion.    Baseline Eval: education needed    Time 3    Period Weeks    Status New    Target Date 08/02/21               OT Long Term Goals - 07/14/21 0841       OT LONG TERM GOAL #1   Title Pt will increase FOTO score to 60 or better to indicate increased functional performance.    Baseline Eval: FOTO 51.    Time 6    Period Weeks    Status New    Target Date 08/23/21      OT LONG TERM GOAL #2   Title Pt will increase functional reach to >10" without UE support to decrease fall risk with functional reaching during daily tasks.    Baseline Eval: Functional reach 9" without AD, 13" with 1 hand on support surface.    Time 6    Period Weeks    Status New    Target Date 08/25/21      OT LONG TERM GOAL #3   Title Pt will increase 360 degree turn test score to WNL (4 secs or less) to indicate improved dynamic standing/decreased fall risk within small spaces for kitchen mobility.    Baseline Eval: 360 turn test score 7 sec with RW    Time 6    Period Weeks    Status New    Target Date 08/23/21      OT LONG TERM GOAL #4   Title Pt will be modified indep to perform hot meal prep at stove top.    Baseline Eval: Dep for hot meal prep, using microwave only and preparing cold meals d/t limited ability to release hands from walker.    Time 6    Period Weeks    Status New    Target Date 08/23/21      OT LONG TERM GOAL #5   Title Pt will manage laundry tasks with modified indep.    Baseline Eval: max A; brother currently managing pt's laundry    Time 6    Period Weeks    Status New    Target Date 08/23/21      Long Term  Additional Goals    Additional Long Term Goals Yes      OT LONG TERM GOAL #6   Title Pt will increase bilat grip strength by 10 or more lbs to improve ability to hold and carry ADL supplies.    Baseline Eval: R grip strength 66 lbs, L 68 (~20 below normal range for pt's age and gender)    Time 65    Period Weeks    Status New    Target Date 08/23/21                   Plan - 07/25/21 1707     Clinical Impression Statement Pt. tolerated the treatment session well. Pt. Did require multiple rest breaks during standing tasks. Pt. presented with episodes of unsteadiness, and retropulsion, however was able to self correct 100% of the time. Pt. Presented with increased unsteadiness when performing tasks requiring the use of his BUEs during folding tasks, and when attempting to place a shirt onto a hanger before placing the hanger into the closet.     OT Occupational Profile and History Problem Focused Assessment - Including review of records relating to presenting problem    Occupational performance deficits (Please refer to evaluation for details): ADL's;IADL's    Body Structure / Function / Physical Skills ADL;Coordination;Endurance;GMC;UE functional use;Balance;Sensation;Body mechanics;Decreased knowledge of use of DME;IADL;Pain;Dexterity;FMC;Strength;Gait;Mobility    Rehab Potential Good    Clinical Decision Making Limited treatment options, no task modification necessary    Comorbidities Affecting Occupational Performance: May have comorbidities impacting occupational performance    Modification or Assistance to Complete Evaluation  No modification of tasks or assist necessary to complete eval    OT Frequency 2x / week    OT Duration 6 weeks    OT Treatment/Interventions Self-care/ADL training;Therapeutic exercise;DME and/or AE instruction;Functional Mobility Training;Balance training;Energy conservation;Therapeutic activities;Patient/family education    Consulted and Agree with Plan of Care Patient              Patient will benefit from skilled therapeutic intervention in order to improve the following deficits and impairments:   Body Structure / Function / Physical Skills: ADL, Coordination, Endurance, GMC, UE functional use, Balance, Sensation, Body mechanics, Decreased knowledge of use of DME, IADL, Pain, Dexterity, FMC, Strength, Gait, Mobility       Visit Diagnosis: Muscle weakness (generalized)    Problem List Patient Active Problem List   Diagnosis Date Noted   Lumbar disc disorder with myelopathy 06/01/2021   Unsteady gait 05/27/2021   Fall at home, initial encounter 05/27/2021   Lower extremity weakness 05/26/2021   Obesity (BMI 35.0-39.9 without comorbidity) 05/26/2021   Anxiety 09/12/2017   Bipolar disorder (HCC) 09/12/2017   Insulin dependent diabetes mellitus 09/12/2017   Primary insomnia 09/12/2017   Pure hypercholesterolemia 09/12/2017   Recurrent major depressive disorder, in partial remission (HCC) 09/12/2017   Tobacco dependence 09/12/2017   Vitamin D deficiency 09/12/2017   DM type 2 with diabetic peripheral neuropathy (HCC) 04/06/2016   Hypercalcemia 09/13/2015   Hypogonadism in male 09/13/2015   Non morbid obesity due to excess calories 09/13/2015   Olegario Messier, MS, OTR/L   Olegario Messier, OT 07/25/2021, 5:10 PM  [place] [facility] MAIN [facility] SERVICES [address] [place], [place], [address] Phone: [phone]   Fax:  [phone]  Name: Topher Buenaventura MRN: [id number] Date of Birth: 1975/11/07

## 2021-07-25 NOTE — Therapy (Signed)
[place] [facility] MAIN [facility] SERVICES [address] [place], [place], [address] Phone: [phone]   Fax:  [phone]  Physical Therapy Treatment  Patient Details  Name: Jerry Wheeler MRN: [id number] Date of Birth: 1975/09/11 Referring Provider (PT): Delle Reining PA/ Burnadette Pop PCP   Encounter Date: 07/25/2021   PT End of Session - 07/25/21 1503     Visit Number 4    Number of Visits 17    Date for PT Re-Evaluation 09/07/21    Authorization Type medicare    PT Start Time 1515    PT Stop Time 1559    PT Time Calculation (min) 44 min    Equipment Utilized During Treatment Gait belt    Activity Tolerance Patient limited by fatigue    Behavior During Therapy WFL for tasks assessed/performed             Past Medical History:  Diagnosis Date   Anxiety disorder    Bipolar disorder (HCC)    Diabetes mellitus (HCC)    Diabetic peripheral neuropathy associated with type 2 diabetes mellitus (HCC)    Emphysema lung (HCC)    hospitalization 1999   High blood pressure    Major depressive disorder in partial remission (HCC)    Morbid obesity (HCC)    Osteomyelitis of great toe of left foot (HCC)    Sleep apnea    Tobacco use disorder, continuous    Ulcer of right heel (HCC)    followed by podiatry--fully healed 09/2020    Past Surgical History:  Procedure Laterality Date   DG GREAT TOE LEFT FOOT  2010   s/p I and D for osteo    There were no vitals filed for this visit.   Subjective Assessment - 07/25/21 1503     Subjective Patient states he is fatigued after just completing his OT visit but states that's okay- "That's why I am here."    Pertinent History 46 yo Male admitted to the hospital 05/26/2021 - 06/01/2021 at [facility] with lower extremity weakness secondary to peripheral neuropathy and severe spinal stenosis seen on MRI on 05/26/2021. He was discharged to [facility] rehab 06/01/2021 - 06/09/2021. He was discharged to his brother's  home. Prior to weakness he was in a 2 story apartment with stairs to enter. Therefore he is currently living at his brothers home which is 2 story but living is on the main floor; he does have 4 steps to enter home with B railings; He reports navigating those steps well. He is currenlty ambulating with RW, able to take a few steps without walker. Reports falling on 12/9 prior to admittance but otherwise no new falls. He denies any significant numbness/tingling; He does have neuropathy in the feet from diabetes but he doesn't feel that it has worsened. He does have an appointment with Dr. Adriana Simas (neurosurgeon); He is not sure that they will recommend surgery due to uncontrolled diabetes and concern that surgery may not improve condition;    How long can you sit comfortably? several hours    How long can you stand comfortably? Unsure- able to stand at least 5 min but hasn't tested it.    How long can you walk comfortably? about 500 feet with fatigue, does use RW    Diagnostic tests MRI on 05/26/21 IMPRESSION:  1. Multilevel lumbar spondylosis, as detailed above.  2. Severe canal stenosis at L3-L4.  3. Moderate canal stenosis at L2-L3.  4. Mild-to-moderate canal stenosis with moderate-severe left  foraminal  stenosis at L4-L5.    Patient Stated Goals "get back into my apartment- negotiate steps, walk without walker"    Currently in Pain? No/denies             INTERVENTIONS:    Attempted Nustep: Seat level 14 LE only x 3 min- stopped due to popping noise on machine.   Modified Sit to stand= without UE support 26" x6 (VC not to use back of legs to push)  25" x3 24" x 3  Sidelye Hip ABD  2sets of 10 reps each leg Sidelye Hip ER (Clamshell) - 2 sets of 10 reps each leg  Supine Bridging x 10- VC for technique  Supine SLR x 10 BLE O2= 96% and HR= 107 bpm  Near tandem standing (Each foot on airex pad) hold 5-15 sec x multiple trials.   Step ups  with 1 rail assist x 6 reps each (stopped due to  fatigue)   Access Code: [email or web address] URL: [email or web address] Date: 07/25/2021 Prepared by: Maureen Ralphs  Exercises Supine Bridge - 1 x daily - 3 x weekly - 3 sets - 10 reps - 2 hold Clamshell - 1 x daily - 3 x weekly - 3 sets - 10 reps Supine Active Straight Leg Raise - 1 x daily - 3 x weekly - 3 sets - 10 reps Sidelying Hip Abduction - 1 x daily - 7 x weekly - 3 sets - 10 reps  Education provided throughout session via VC/TC and demonstration to facilitate movement at target joints and correct muscle activation for all testing and exercises performed.                       PT Education - 07/25/21 1503     Education Details Exercise technique    Person(s) Educated Patient    Methods Explanation;Demonstration;Tactile cues;Verbal cues    Comprehension Verbalized understanding;Returned demonstration;Verbal cues required;Tactile cues required;Need further instruction              PT Short Term Goals - 07/13/21 1548       PT SHORT TERM GOAL #1   Title Patient will be adherent to HEP at least 3x a week to improve functional strength and balance for better safety at home.    Time 4    Period Weeks    Status New    Target Date 08/10/21      PT SHORT TERM GOAL #2   Title Patient will demonstrate improvement in foot/ankle strength by being able to complete BLE heel raise with minimal UE support for better gait safety and mobility;    Time 4    Period Weeks    Status New    Target Date 08/10/21               PT Long Term Goals - 07/13/21 1550       PT LONG TERM GOAL #1   Title Patient will increase BLE gross strength to 4+/5  especially in hip as to improve functional strength for independent gait, increased standing tolerance and increased ADL ability.    Time 8    Period Weeks    Status New    Target Date 09/07/21      PT LONG TERM GOAL #2   Title Patient will increase six minute walk test distance to >800 for progression  toward community ambulator and improve gait ability    Time 8    Period Weeks    Status New  Target Date 09/07/21      PT LONG TERM GOAL #3   Title Patient will demonstrate an improved Berg Balance Score of > 42/56 as to demonstrate improved balance with ADLs such as sitting/standing and transfer balance and reduced fall risk.    Time 8    Period Weeks    Status New    Target Date 09/07/21      PT LONG TERM GOAL #4   Title Patient will reduce timed up and go to <14 seconds to reduce fall risk and demonstrate improved transfer/gait ability.    Time 8    Period Weeks    Status New    Target Date 09/07/21                   Plan - 07/25/21 1503     Clinical Impression Statement Patient presented today with good motivation for today's session only complaining of fatigue from just completing OT session. He was able to perform well with all prescribed exercises utilizing short rest breaks. He presents with quick fatigue with LE exercises and he will benefit from additional skilled PT Intervention to improve strength, balance and mobility    Personal Factors and Comorbidities Comorbidity 3+;Fitness;Time since onset of injury/illness/exacerbation    Comorbidities PMH Significant: Obesity, Anxiety/depression, bipolar disorder, Type 2 diabetes with neuropathy, insomnia, HTN, Lumbar stenosis    Examination-Activity Limitations Caring for Others;Carry;Locomotion Level;Squat;Stairs;Stand;Transfers    Examination-Participation Restrictions Cleaning;Community Activity;Driving;Occupation;Shop;Volunteer;Yard Work    Stability/Clinical Decision Making Stable/Uncomplicated    Rehab Potential Fair    PT Frequency 2x / week    PT Duration 8 weeks    PT Treatment/Interventions ADLs/Self Care Home Management;Cryotherapy;Moist Heat;Gait training;Stair training;Functional mobility training;Therapeutic activities;Therapeutic exercise;Balance training;Neuromuscular re-education;Patient/family  education;Orthotic Fit/Training;Manual techniques    PT Next Visit Plan Continue with strength/balance    PT Home Exercise Plan 07/26/2021=Access Code: [email or web address]    Consulted and Agree with Plan of Care Patient             Patient will benefit from skilled therapeutic intervention in order to improve the following deficits and impairments:  Abnormal gait, Decreased balance, Decreased endurance, Decreased mobility, Difficulty walking, Impaired sensation, Obesity, Increased edema, Decreased activity tolerance, Decreased safety awareness, Decreased strength  Visit Diagnosis: Muscle weakness (generalized)  Unsteadiness on feet  Difficulty in walking, not elsewhere classified     Problem List Patient Active Problem List   Diagnosis Date Noted   Lumbar disc disorder with myelopathy 06/01/2021   Unsteady gait 05/27/2021   Fall at home, initial encounter 05/27/2021   Lower extremity weakness 05/26/2021   Obesity (BMI 35.0-39.9 without comorbidity) 05/26/2021   Anxiety 09/12/2017   Bipolar disorder (HCC) 09/12/2017   Insulin dependent diabetes mellitus 09/12/2017   Primary insomnia 09/12/2017   Pure hypercholesterolemia 09/12/2017   Recurrent major depressive disorder, in partial remission (HCC) 09/12/2017   Tobacco dependence 09/12/2017   Vitamin D deficiency 09/12/2017   DM type 2 with diabetic peripheral neuropathy (HCC) 04/06/2016   Hypercalcemia 09/13/2015   Hypogonadism in male 09/13/2015   Non morbid obesity due to excess calories 09/13/2015    Lenda Kelp, PT 07/26/2021, 1:35 PM  [place] [facility] MAIN [facility] SERVICES [address] [place], [place], [address] Phone: [phone]   Fax:  [phone]  Name: Jerry Wheeler MRN: [id number] Date of Birth: 07-03-1975

## 2021-07-27 ENCOUNTER — Ambulatory Visit: Payer: Medicare Other

## 2021-07-31 ENCOUNTER — Other Ambulatory Visit: Payer: Self-pay

## 2021-07-31 ENCOUNTER — Ambulatory Visit: Payer: Medicare Other

## 2021-07-31 ENCOUNTER — Ambulatory Visit: Payer: Medicare Other | Admitting: Physical Therapy

## 2021-07-31 ENCOUNTER — Encounter: Payer: Self-pay | Admitting: Physical Therapy

## 2021-07-31 DIAGNOSIS — M6281 Muscle weakness (generalized): Secondary | ICD-10-CM

## 2021-07-31 DIAGNOSIS — R262 Difficulty in walking, not elsewhere classified: Secondary | ICD-10-CM

## 2021-07-31 DIAGNOSIS — R2681 Unsteadiness on feet: Secondary | ICD-10-CM

## 2021-07-31 DIAGNOSIS — R278 Other lack of coordination: Secondary | ICD-10-CM

## 2021-07-31 DIAGNOSIS — M5106 Intervertebral disc disorders with myelopathy, lumbar region: Secondary | ICD-10-CM

## 2021-07-31 NOTE — Therapy (Signed)
[facility] [address] [place], [place], [address] Phone: [phone]   Fax:  [phone]  Physical Therapy Treatment  Patient Details  Name: [facility] MRN: [id number] Date of Birth: [birth year] Referring Provider (PT): Delle Reining PA/ Burnadette Pop PCP   Encounter Date: 07/31/2021   PT End of Session - 07/31/21 0810     Visit Number 5    Number of Visits 17    Date for PT Re-Evaluation 09/07/21    Authorization Type medicare    PT Start Time 0804    PT Stop Time 0845    PT Time Calculation (min) 41 min    Equipment Utilized During Treatment Gait belt    Activity Tolerance Patient limited by fatigue    Behavior During Therapy WFL for tasks assessed/performed             Past Medical History:  Diagnosis Date   Anxiety disorder    Bipolar disorder (HCC)    Diabetes mellitus (HCC)    Diabetic peripheral neuropathy associated with type 2 diabetes mellitus (HCC)    Emphysema lung (HCC)    hospitalization 1999   High blood pressure    Major depressive disorder in partial remission (HCC)    Morbid obesity (HCC)    Osteomyelitis of great toe of left foot (HCC)    Sleep apnea    Tobacco use disorder, continuous    Ulcer of right heel (HCC)    followed by podiatry--fully healed 09/2020    Past Surgical History:  Procedure Laterality Date   DG GREAT TOE LEFT FOOT  2010   s/p I and D for osteo    There were no vitals filed for this visit.   Subjective Assessment - 07/31/21 0809     Subjective Patient reports continued fatigue. he states he didn't sleep well last night which does happen sometimes. Reports HEP is going well and he feels like he is able to do a little more activity. Denies any pain currently;    Pertinent History 46 yo Male [facility] with lower extremity weakness secondary to peripheral neuropathy and severe spinal stenosis seen on MRI on  05/26/2021. [facility] 06/09/2021. He was discharged to his brother's home. Prior to weakness he was in a 2 story apartment with stairs to enter. Therefore he is currently living at his brothers home which is 2 story but living is on the main floor; he does have 4 steps to enter home with B railings; He reports navigating those steps well. He is currenlty ambulating with RW, able to take a few steps without walker. Reports falling on 12/9 prior to admittance but otherwise no new falls. He denies any significant numbness/tingling; He does have neuropathy in the feet from diabetes but he doesn't feel that it has worsened. He does have an appointment with Dr. Adriana [facility] (neurosurgeon); He is not sure that they will recommend surgery due to uncontrolled diabetes and concern that surgery may not improve condition;    How long can you sit comfortably? several hours    How long can you stand comfortably? Unsure- able to stand at least 5 min but hasn't tested it.    How long can you walk comfortably? about 500 feet with fatigue, does use RW    Diagnostic tests MRI on 05/26/21 IMPRESSION:  1. Multilevel lumbar spondylosis, as detailed above.  2. Severe canal stenosis at  L3-L4.  3. Moderate canal stenosis at L2-L3.  4. Mild-to-moderate canal stenosis with moderate-severe left  foraminal stenosis at L4-L5.    Patient Stated Goals "get back into my apartment- negotiate steps, walk without walker"    Currently in Pain? No/denies                   TREATMENT: Warm up on cross trainer level 3 x4 min with cues to keep steps per minute >50 for cardiovascular conditioning;  Following exercise, SPo2 97%, HR 144bpm with increased shortness of breath reported;  Required min A to get on/off machine;   Seated hamstring curl red tband x15 reps each LE, moderate difficulty; Seated LAQ with ankle DF, 3# x12 reps each LE, 3 sec hold; patient reports minimal difficulty; Seated heel/toe  raises 3# x15 reps, partial ROM   Sit<>Stand from elevated mat table x5 reps holding small ball in BUE to challenge LE weight bearing and increase challenge in LE; Required CGA and min VCS for weight shift to reduce lean on mat table and utilize quad activation for better LE strengthening;  Standing on firm surface, feet apart, holding weighted ball (2#): Diagonal reach up/down tapping wall and chair to challenge upper trunk control, balance and overall conditioning with weight shift and reaching outside base of support. x2 reps, Required min A with significant difficulty having trouble coming back up into standing after bending forward; Modified with reaching with ball side/side in upright position to challenge balance and reaching outside base of support x5 reps each direction with targets to improve reach; Patient exhibits decreased ankle strategies relying heavily on hip strategies for balance and trunk control;    Standing in parallel bars: 3# ankle weight: -Hip flexion march x10 reps each LE with rail assist -side stepping x10 feet x2 laps each direction with BUE rail assist; Patient does fatigue quickly with increased shortness of breath, vitals assessed: HR 132, SpO2 98%  Finished with walking for 2 min, approximately 300 feet with RW, Required close supervision with heavy foot slap, wide base of support and cues to increase erect posture and increase step length for better gait safety;     Patient reports moderate difficulty with exercise with increased fatigue and shortness of breath, vitals are WNL. No increase in pain reported;  Reinforced importance of HEP adherence;    Educated patient in walking program to increase cardiovascular endurance at home.                            PT Education - 07/31/21 0810     Education Details exercise technique/HEP    Person(s) Educated Patient    Methods Explanation;Verbal cues    Comprehension Verbalized  understanding;Returned demonstration;Verbal cues required;Need further instruction              PT Short Term Goals - 07/13/21 1548       PT SHORT TERM GOAL #1   Title Patient will be adherent to HEP at least 3x a week to improve functional strength and balance for better safety at home.    Time 4    Period Weeks    Status New    Target Date 08/10/21      PT SHORT TERM GOAL #2   Title Patient will demonstrate improvement in foot/ankle strength by being able to complete BLE heel raise with minimal UE support for better gait safety and mobility;    Time 4  Period Weeks    Status New    Target Date 08/10/21               PT Long Term Goals - 07/13/21 1550       PT LONG TERM GOAL #1   Title Patient will increase BLE gross strength to 4+/5  especially in hip as to improve functional strength for independent gait, increased standing tolerance and increased ADL ability.    Time 8    Period Weeks    Status New    Target Date 09/07/21      PT LONG TERM GOAL #2   Title Patient will increase six minute walk test distance to >800 for progression toward community ambulator and improve gait ability    Time 8    Period Weeks    Status New    Target Date 09/07/21      PT LONG TERM GOAL #3   Title Patient will demonstrate an improved Berg Balance Score of > 42/56 as to demonstrate improved balance with ADLs such as sitting/standing and transfer balance and reduced fall risk.    Time 8    Period Weeks    Status New    Target Date 09/07/21      PT LONG TERM GOAL #4   Title Patient will reduce timed up and go to <14 seconds to reduce fall risk and demonstrate improved transfer/gait ability.    Time 8    Period Weeks    Status New    Target Date 09/07/21                   Plan - 07/31/21 8372     Clinical Impression Statement Patient motivated and participated well within session. he was instructed in advanced LE strengthening with increased repetition.  Patient does fatigue quickly requiring short seated rest breaks. He became significantly fatigued with cardiovascular conditioning warm up with elevated heart rate, exhibiting increased muscle fasiculations. Patient reports moderate difficulty with most exercise during today's session. He does require min VCs for proper exercise technique/positioning. He would benefit from additional skilled PT Intervention to improve strength, balance and gait safety;    Personal Factors and Comorbidities Comorbidity 3+;Fitness;Time since onset of injury/illness/exacerbation    Comorbidities PMH Significant: Obesity, Anxiety/depression, bipolar disorder, Type 2 diabetes with neuropathy, insomnia, HTN, Lumbar stenosis    Examination-Activity Limitations Caring for Others;Carry;Locomotion Level;Squat;Stairs;Stand;Transfers    Examination-Participation Restrictions Cleaning;Community Activity;Driving;Occupation;Shop;Volunteer;Yard Work    Stability/Clinical Decision Making Stable/Uncomplicated    Rehab Potential Fair    PT Frequency 2x / week    PT Duration 8 weeks    PT Treatment/Interventions ADLs/Self Care Home Management;Cryotherapy;Moist Heat;Gait training;Stair training;Functional mobility training;Therapeutic activities;Therapeutic exercise;Balance training;Neuromuscular re-education;Patient/family education;Orthotic Fit/Training;Manual techniques    PT Next Visit Plan Continue with strength/balance    PT Home Exercise Plan 07/26/2021=Access Code: [email or web address]    Consulted and Agree with Plan of Care Patient             Patient will benefit from skilled therapeutic intervention in order to improve the following deficits and impairments:  Abnormal gait, Decreased balance, Decreased endurance, Decreased mobility, Difficulty walking, Impaired sensation, Obesity, Increased edema, Decreased activity tolerance, Decreased safety awareness, Decreased strength  Visit Diagnosis: Muscle weakness  (generalized)  Unsteadiness on feet  Difficulty in walking, not elsewhere classified  Other lack of coordination     Problem List Patient Active Problem List   Diagnosis Date Noted   Lumbar disc disorder with myelopathy 06/01/2021  Unsteady gait 05/27/2021   Fall at home, initial encounter 05/27/2021   Lower extremity weakness 05/26/2021   Obesity (BMI 35.0-39.9 without comorbidity) 05/26/2021   Anxiety 09/12/2017   Bipolar disorder (HCC) 09/12/2017   Insulin dependent diabetes mellitus 09/12/2017   Primary insomnia 09/12/2017   Pure hypercholesterolemia 09/12/2017   Recurrent major depressive disorder, in partial remission (HCC) 09/12/2017   Tobacco dependence 09/12/2017   Vitamin D deficiency 09/12/2017   DM type 2 with diabetic peripheral neuropathy (HCC) 04/06/2016   Hypercalcemia 09/13/2015   Hypogonadism in male 09/13/2015   Non morbid obesity due to excess calories 09/13/2015    Charmine Bockrath, PT, DPT 07/31/2021, 8:30 AM  [facility] [address] [place], [place], [id number] Phone: [phone]   Fax:  [phone]  Name: Sherwood GamblerJonathan Tormey MRN: [id number] Date of Birth: Aug 06, 1975

## 2021-07-31 NOTE — Patient Instructions (Signed)
WALKING  Walking is a great form of exercise to increase your strength, endurance and overall fitness.  A walking program can help you start slowly and gradually build endurance as you go.  Everyone's ability is different, so each person's starting point will be different.  You do not have to follow them exactly.  The are just samples. You should simply find out what's right for you and stick to that program.   In the beginning, you'll start off walking 2-3 times a day for short distances.  As you get stronger, you'll be walking further at just 1-2 times per day.  A. You Can Walk For A Certain Length Of Time Each Day    Walk 5 minutes 3 times per day.  Increase 2 minutes every 2 days (3 times per day).  Work up to 25-30 minutes (1-2 times per day).   Example:   Day 1-2 2 minutes  2 times per day   Day 7-8 12 minutes 2-3 times per day   Day 13-14 25 minutes 1-2 times per day  B. You Can Walk For a Certain Distance Each Day     Distance can be substituted for time.    Example:   Do 5-10 laps in the hallway

## 2021-07-31 NOTE — Therapy (Signed)
[facility] MAIN [facility] SERVICES [address] [place], [place], [address] Phone: [phone]   Fax:  [phone]  Occupational Therapy Treatment  Patient Details  Name: [facility] MRN: [id number] Date of Birth: 01/05/1976 Referring Provider (OT): Delle Reining, [place]   Encounter Date: 07/31/2021   OT End of Session - 07/31/21 1048     Visit Number 5    Number of Visits 12    Date for OT Re-Evaluation 08/23/21    OT Start Time 0845    OT Stop Time 0930    OT Time Calculation (min) 45 min    Equipment Utilized During Treatment RW    Activity Tolerance Patient tolerated treatment well    Behavior During Therapy [facility] for tasks assessed/performed             Past Medical History:  Diagnosis Date   Anxiety disorder    Bipolar disorder (HCC)    Diabetes mellitus (HCC)    Diabetic peripheral neuropathy associated with type 2 diabetes mellitus (HCC)    Emphysema lung (HCC)    hospitalization 1999   High blood pressure    Major depressive disorder in partial remission (HCC)    Morbid obesity (HCC)    Osteomyelitis of great toe of left foot (HCC)    Sleep apnea    Tobacco use disorder, continuous    Ulcer of right heel (HCC)    followed by podiatry--fully healed 09/2020    Past Surgical History:  Procedure Laterality Date   DG GREAT TOE LEFT FOOT  2010   s/p I and D for osteo    There were no vitals filed for this visit.   Subjective Assessment - 07/31/21 0845     Subjective  "I'm still not feeling that great but I think it's mainly because of a lack of sleep."    Patient is accompanied by: Family member    Pertinent History poorly controlled T2DM with peripheral neuropathy, bipolar disorder, OSA who was admitted to [facility] on 05/26/21 with worsening of BLE weakness and  numbness for 3 weeks progressing to inability to walk x one day. Neurology consulted and MRI spine done revealing severe canal stenosis L3/L4, moderate canal  stenosis L2/L3 and mild to moderate canal stenosis with  moderate to severe left foraminal stenosis at L4/L5.    Limitations Muscle weakness, mild BUE tremors, impaired balance and gait, hx of falling, neuropathy in BLEs    Patient Stated Goals "I want to improve my balance so I can safely be in my kitchen to cook."    Currently in Pain? No/denies    Pain Score 0-No pain    Pain Onset Yesterday            Occupational Therapy Treatment: Therapeutic Exercise: Facilitated hand strengthening with use of hand gripper set at 17.9#  for R/L for 2 trials, then progressed to 23.4# R/L for a 3rd trial to remove jumbo pegs from pegboard.    Therapeutic Activity: Facilitated dynamic standing writing on white board and reaching for magnets.  Challenged reaching below waist, trunk rotation to reach behind back, lateral reaching with side stepping R/L in order to simulate turning and reaching required for meal prep activities.  Alternated R/LUEs with reaching, keeping unilateral support on walker throughout.  Min guard from OT throughout dynamic standing tasks.  Pt required seated rest break after each reaching task d/t LE fatigue.   Response to Treatment: See Plan/clinical impression below.    OT Education -  07/31/21 1048     Education Details Safe walker use and foot positioning when standing to reach and turn    Person(s) Educated Patient    Methods Explanation;Verbal cues;Demonstration;Tactile cues    Comprehension Verbalized understanding;Verbal cues required;Returned demonstration;Need further instruction              OT Short Term Goals - 07/14/21 1749       OT SHORT TERM GOAL #1   Title Pt will verbalize and demonstrate 3 fall prevention strategies to reduce fall risk with ADLs/IADLs.    Baseline Eval: not yet initiated    Time 3    Period Weeks    Status New    Target Date 08/02/21      OT SHORT TERM GOAL #2   Title Pt will verbalize 3 compensatory strategies to manage BUE  tremors during ADL completion.    Baseline Eval: education needed    Time 3    Period Weeks    Status New    Target Date 08/02/21               OT Long Term Goals - 07/14/21 0841       OT LONG TERM GOAL #1   Title Pt will increase FOTO score to 60 or better to indicate increased functional performance.    Baseline Eval: FOTO 51.    Time 6    Period Weeks    Status New    Target Date 08/23/21      OT LONG TERM GOAL #2   Title Pt will increase functional reach to >10" without UE support to decrease fall risk with functional reaching during daily tasks.    Baseline Eval: Functional reach 9" without AD, 13" with 1 hand on support surface.    Time 6    Period Weeks    Status New    Target Date 08/25/21      OT LONG TERM GOAL #3   Title Pt will increase 360 degree turn test score to WNL (4 secs or less) to indicate improved dynamic standing/decreased fall risk within small spaces for kitchen mobility.    Baseline Eval: 360 turn test score 7 sec with RW    Time 6    Period Weeks    Status New    Target Date 08/23/21      OT LONG TERM GOAL #4   Title Pt will be modified indep to perform hot meal prep at stove top.    Baseline Eval: Dep for hot meal prep, using microwave only and preparing cold meals d/t limited ability to release hands from walker.    Time 6    Period Weeks    Status New    Target Date 08/23/21      OT LONG TERM GOAL #5   Title Pt will manage laundry tasks with modified indep.    Baseline Eval: max A; brother currently managing pt's laundry    Time 6    Period Weeks    Status New    Target Date 08/23/21      Long Term Additional Goals   Additional Long Term Goals Yes      OT LONG TERM GOAL #6   Title Pt will increase bilat grip strength by 10 or more lbs to improve ability to hold and carry ADL supplies.    Baseline Eval: R grip strength 66 lbs, L 68 (~20 below normal range for pt's age and gender)    Time 6  Period Weeks    Status New     Target Date 08/23/21              Plan - 07/31/21 1058     Clinical Impression Statement Able to progress grip resistance today from 17.9# to 23.4# on hand gripper.  Pt continues to progress with dynamic standing challenges, though requiring min guard and frequent rest breaks.  Pt stands with wider BOS and reaches slow with caution.  Pt reports increased need for R unilateral support when reaching with L hand in any direction, less when L hand is on the walker and pt reaches with R.  Pt will continue to benefit from skilled OT to improve dynamic standing balance and grip strength for improved performance with daily tasks.    OT Occupational Profile and History Problem Focused Assessment - Including review of records relating to presenting problem    Occupational performance deficits (Please refer to evaluation for details): ADL's;IADL's    Body Structure / Function / Physical Skills ADL;Coordination;Endurance;GMC;UE functional use;Balance;Sensation;Body mechanics;Decreased knowledge of use of DME;IADL;Pain;Dexterity;FMC;Strength;Gait;Mobility    Rehab Potential Good    Clinical Decision Making Limited treatment options, no task modification necessary    Comorbidities Affecting Occupational Performance: May have comorbidities impacting occupational performance    Modification or Assistance to Complete Evaluation  No modification of tasks or assist necessary to complete eval    OT Frequency 2x / week    OT Duration 6 weeks    OT Treatment/Interventions Self-care/ADL training;Therapeutic exercise;DME and/or AE instruction;Functional Mobility Training;Balance training;Energy conservation;Therapeutic activities;Patient/family education    Consulted and Agree with Plan of Care Patient             Patient will benefit from skilled therapeutic intervention in order to improve the following deficits and impairments:   Body Structure / Function / Physical Skills: ADL, Coordination, Endurance,  GMC, UE functional use, Balance, Sensation, Body mechanics, Decreased knowledge of use of DME, IADL, Pain, Dexterity, FMC, Strength, Gait, Mobility       Visit Diagnosis: Muscle weakness (generalized)  Other lack of coordination  Lumbar disc disorder with myelopathy    Problem List Patient Active Problem List   Diagnosis Date Noted   Lumbar disc disorder with myelopathy 06/01/2021   Unsteady gait 05/27/2021   Fall at home, initial encounter 05/27/2021   Lower extremity weakness 05/26/2021   Obesity (BMI 35.0-39.9 without comorbidity) 05/26/2021   Anxiety 09/12/2017   Bipolar disorder (HCC) 09/12/2017   Insulin dependent diabetes mellitus 09/12/2017   Primary insomnia 09/12/2017   Pure hypercholesterolemia 09/12/2017   Recurrent major depressive disorder, in partial remission (HCC) 09/12/2017   Tobacco dependence 09/12/2017   Vitamin D deficiency 09/12/2017   DM type 2 with diabetic peripheral neuropathy (HCC) 04/06/2016   Hypercalcemia 09/13/2015   Hypogonadism in male 09/13/2015   Non morbid obesity due to excess calories 09/13/2015   Danelle Earthly, MS, OTR/L  [employer], OT 07/31/2021, 10:58 AM  [place] [facility] [facility] MAIN [facility] SERVICES [address] [place], [place], [address] Phone: [phone]   Fax:  [phone]  Name: [facility] MRN: [id number] Date of Birth: 09-Feb-1976

## 2021-08-02 ENCOUNTER — Ambulatory Visit: Payer: Medicare Other | Admitting: Physical Therapy

## 2021-08-02 ENCOUNTER — Other Ambulatory Visit: Payer: Self-pay

## 2021-08-02 ENCOUNTER — Ambulatory Visit: Payer: Medicare Other

## 2021-08-02 ENCOUNTER — Encounter: Payer: Self-pay | Admitting: Physical Therapy

## 2021-08-02 DIAGNOSIS — R2681 Unsteadiness on feet: Secondary | ICD-10-CM

## 2021-08-02 DIAGNOSIS — M6281 Muscle weakness (generalized): Secondary | ICD-10-CM

## 2021-08-02 DIAGNOSIS — R262 Difficulty in walking, not elsewhere classified: Secondary | ICD-10-CM

## 2021-08-02 DIAGNOSIS — M5106 Intervertebral disc disorders with myelopathy, lumbar region: Secondary | ICD-10-CM

## 2021-08-02 DIAGNOSIS — R278 Other lack of coordination: Secondary | ICD-10-CM

## 2021-08-02 NOTE — Therapy (Signed)
[facility] MAIN [facility] SERVICES [address] [place], [place], [address] Phone: [phone]   Fax:  [phone]  Occupational Therapy Treatment  Patient Details  Name: Jerry Wheeler MRN: [id number] Date of Birth: 11/30/1975 Referring Provider (OT): Delle Reining, [place]   Encounter Date: 08/02/2021   OT End of Session - 08/02/21 0946     Visit Number 6    Number of Visits 12    Date for OT Re-Evaluation 08/23/21    OT Start Time 0845    OT Stop Time 0928    OT Time Calculation (min) 43 min    Equipment Utilized During Treatment RW    Activity Tolerance Patient tolerated treatment well    Behavior During Therapy [facility] for tasks assessed/performed             Past Medical History:  Diagnosis Date   Anxiety disorder    Bipolar disorder (HCC)    Diabetes mellitus (HCC)    Diabetic peripheral neuropathy associated with type 2 diabetes mellitus (HCC)    Emphysema lung (HCC)    hospitalization 1999   High blood pressure    Major depressive disorder in partial remission (HCC)    Morbid obesity (HCC)    Osteomyelitis of great toe of left foot (HCC)    Sleep apnea    Tobacco use disorder, continuous    Ulcer of right heel (HCC)    followed by podiatry--fully healed 09/2020    Past Surgical History:  Procedure Laterality Date   DG GREAT TOE LEFT FOOT  2010   s/p I and D for osteo    There were no vitals filed for this visit.   Subjective Assessment - 08/02/21 0945     Subjective  "It was harder than I thought it would be." (pt referring to meal prep activity with OT during this session)    Patient is accompanied by: Family member    Pertinent History poorly controlled T2DM with peripheral neuropathy, bipolar disorder, OSA who was admitted to [facility] on 05/26/21 with worsening of BLE weakness and  numbness for 3 weeks progressing to inability to walk x one day. Neurology consulted and MRI spine done revealing severe canal stenosis L3/L4,  moderate canal stenosis L2/L3 and mild to moderate canal stenosis with  moderate to severe left foraminal stenosis at L4/L5.    Limitations Muscle weakness, mild BUE tremors, impaired balance and gait, hx of falling, neuropathy in BLEs    Patient Stated Goals "I want to improve my balance so I can safely be in my kitchen to cook."    Currently in Pain? No/denies    Pain Score 0-No pain    Pain Onset Yesterday            Occupational Therapy Treatment: Self Care: Pt participated in breakfast meal prep activity scrambling eggs and frying toast on stove top.  Pt utilized RW with intermittent vc for positioning to maximize stability and safety at countertop.  Pt utilized unilateral support throughout, with 2-3 brief periods of no UE support with wide BOS and min guard from OT.  OT reinforced EC throughout with cues for frequent sitting breaks, and performing partial meal prep in sitting as needed.  Pt stood at sink to rinse and load dishes into dishwasher, leaning forward into countertop resting on forearms.  Pt used countertop for sliding dishes across the room to ease item transport d/t using walker.  Pt reports he has not yet found a walker tray to  fit his walker.   Response to Treatment: See Plan/clinical impression below.    OT Education - 08/02/21 0946     Education Details EC with meal prep activities/safe walker positioning at countertop    Person(s) Educated Patient    Methods Explanation;Verbal cues;Demonstration;Tactile cues    Comprehension Verbalized understanding;Verbal cues required;Returned demonstration;Need further instruction              OT Short Term Goals - 07/14/21 4166       OT SHORT TERM GOAL #1   Title Pt will verbalize and demonstrate 3 fall prevention strategies to reduce fall risk with ADLs/IADLs.    Baseline Eval: not yet initiated    Time 3    Period Weeks    Status New    Target Date 08/02/21      OT SHORT TERM GOAL #2   Title Pt will verbalize  3 compensatory strategies to manage BUE tremors during ADL completion.    Baseline Eval: education needed    Time 3    Period Weeks    Status New    Target Date 08/02/21               OT Long Term Goals - 07/14/21 0841       OT LONG TERM GOAL #1   Title Pt will increase FOTO score to 60 or better to indicate increased functional performance.    Baseline Eval: FOTO 51.    Time 6    Period Weeks    Status New    Target Date 08/23/21      OT LONG TERM GOAL #2   Title Pt will increase functional reach to >10" without UE support to decrease fall risk with functional reaching during daily tasks.    Baseline Eval: Functional reach 9" without AD, 13" with 1 hand on support surface.    Time 6    Period Weeks    Status New    Target Date 08/25/21      OT LONG TERM GOAL #3   Title Pt will increase 360 degree turn test score to WNL (4 secs or less) to indicate improved dynamic standing/decreased fall risk within small spaces for kitchen mobility.    Baseline Eval: 360 turn test score 7 sec with RW    Time 6    Period Weeks    Status New    Target Date 08/23/21      OT LONG TERM GOAL #4   Title Pt will be modified indep to perform hot meal prep at stove top.    Baseline Eval: Dep for hot meal prep, using microwave only and preparing cold meals d/t limited ability to release hands from walker.    Time 6    Period Weeks    Status New    Target Date 08/23/21      OT LONG TERM GOAL #5   Title Pt will manage laundry tasks with modified indep.    Baseline Eval: max A; brother currently managing pt's laundry    Time 6    Period Weeks    Status New    Target Date 08/23/21      Long Term Additional Goals   Additional Long Term Goals Yes      OT LONG TERM GOAL #6   Title Pt will increase bilat grip strength by 10 or more lbs to improve ability to hold and carry ADL supplies.    Baseline Eval: R grip strength 66 lbs, L  68 (~20 below normal range for pt's age and gender)    Time  62    Period Weeks    Status New    Target Date 08/23/21               Plan - 08/02/21 1008     Clinical Impression Statement Pt reported meal prep activity to be harder than he thought, though pt had just finished working with PT so legs were fatigued to start.  Pt did present with mild-moderate dyspnea with standing episodes averaging ~2 min, requiring frequent sitting breaks.  Min cues for walker safety, EC, and item transport techniques.  Pt will continue to benefit from skilled OT to improve dynamic standing balance and grip strength for improved performance with daily tasks.    OT Occupational Profile and History Problem Focused Assessment - Including review of records relating to presenting problem    Occupational performance deficits (Please refer to evaluation for details): ADL's;IADL's    Body Structure / Function / Physical Skills ADL;Coordination;Endurance;GMC;UE functional use;Balance;Sensation;Body mechanics;Decreased knowledge of use of DME;IADL;Pain;Dexterity;FMC;Strength;Gait;Mobility    Rehab Potential Good    Clinical Decision Making Limited treatment options, no task modification necessary    Comorbidities Affecting Occupational Performance: May have comorbidities impacting occupational performance    Modification or Assistance to Complete Evaluation  No modification of tasks or assist necessary to complete eval    OT Frequency 2x / week    OT Duration 6 weeks    OT Treatment/Interventions Self-care/ADL training;Therapeutic exercise;DME and/or AE instruction;Functional Mobility Training;Balance training;Energy conservation;Therapeutic activities;Patient/family education    Consulted and Agree with Plan of Care Patient             Patient will benefit from skilled therapeutic intervention in order to improve the following deficits and impairments:   Body Structure / Function / Physical Skills: ADL, Coordination, Endurance, GMC, UE functional use, Balance,  Sensation, Body mechanics, Decreased knowledge of use of DME, IADL, Pain, Dexterity, FMC, Strength, Gait, Mobility       Visit Diagnosis: Muscle weakness (generalized)  Other lack of coordination  Lumbar disc disorder with myelopathy    Problem List Patient Active Problem List   Diagnosis Date Noted   Lumbar disc disorder with myelopathy 06/01/2021   Unsteady gait 05/27/2021   Fall at home, initial encounter 05/27/2021   Lower extremity weakness 05/26/2021   Obesity (BMI 35.0-39.9 without comorbidity) 05/26/2021   Anxiety 09/12/2017   Bipolar disorder (HCC) 09/12/2017   Insulin dependent diabetes mellitus 09/12/2017   Primary insomnia 09/12/2017   Pure hypercholesterolemia 09/12/2017   Recurrent major depressive disorder, in partial remission (HCC) 09/12/2017   Tobacco dependence 09/12/2017   Vitamin D deficiency 09/12/2017   DM type 2 with diabetic peripheral neuropathy (HCC) 04/06/2016   Hypercalcemia 09/13/2015   Hypogonadism in male 09/13/2015   Non morbid obesity due to excess calories 09/13/2015   Danelle Earthly, MS, OTR/L  Jerry Wheeler, OT 08/02/2021, 10:08 AM  [place] [facility] MAIN [facility] SERVICES [address] [place], [place], [address] Phone: [phone]   Fax:  [phone]  Name: Emily Forse MRN: [id number] Date of Birth: [birth year]

## 2021-08-02 NOTE — Therapy (Signed)
[place] [facility] MAIN [facility] SERVICES [address] [place], [place], [address] Phone: [phone]   Fax:  [phone]  Physical Therapy Treatment  Patient Details  Name: Jerry Wheeler MRN: [id number] Date of Birth: [birth year] Referring Provider (PT): Delle ReiningPamela Love PA/ Burnadette PopLinthavong PCP   Encounter Date: 08/02/2021   PT End of Session - 08/02/21 0807     Visit Number 6    Number of Visits 17    Date for PT Re-Evaluation 09/07/21    Authorization Type medicare    PT Start Time 0803    PT Stop Time 0845    PT Time Calculation (min) 42 min    Equipment Utilized During Treatment Gait belt    Activity Tolerance Patient limited by fatigue    Behavior During Therapy WFL for tasks assessed/performed             Past Medical History:  Diagnosis Date   Anxiety disorder    Bipolar disorder (HCC)    Diabetes mellitus (HCC)    Diabetic peripheral neuropathy associated with type 2 diabetes mellitus (HCC)    Emphysema lung (HCC)    hospitalization 1999   High blood pressure    Major depressive disorder in partial remission (HCC)    Morbid obesity (HCC)    Osteomyelitis of great toe of left foot (HCC)    Sleep apnea    Tobacco use disorder, continuous    Ulcer of right heel (HCC)    followed by podiatry--fully healed 09/2020    Past Surgical History:  Procedure Laterality Date   DG GREAT TOE LEFT FOOT  2010   s/p I and D for osteo    There were no vitals filed for this visit.   Subjective Assessment - 08/02/21 0806     Subjective Patient reports doing about the same. He denies any increase in back pain. reports numbness in feet is about the same. No new falls.    Pertinent History 46 yo Male admitted to the hospital 05/26/2021 - [facility] with lower extremity weakness secondary to peripheral neuropathy and severe spinal stenosis seen on MRI on 05/26/2021. [facility] 06/01/2021 - 06/09/2021. He was discharged  to his brother's home. Prior to weakness he was in a 2 story apartment with stairs to enter. Therefore he is currently living at his brothers home which is 2 story but living is on the main floor; he does have 4 steps to enter home with B railings; He reports navigating those steps well. He is currenlty ambulating with RW, able to take a few steps without walker. Reports falling on 12/9 prior to admittance but otherwise no new falls. He denies any significant numbness/tingling; He does have neuropathy in the feet from diabetes but he doesn't feel that it has worsened. He does have an appointment with Dr. Adriana Simasook (neurosurgeon); He is not sure that they will recommend surgery due to uncontrolled diabetes and concern that surgery may not improve condition;    How long can you sit comfortably? several hours    How long can you stand comfortably? Unsure- able to stand at least 5 min but hasn't tested it.    How long can you walk comfortably? about 500 feet with fatigue, does use RW    Diagnostic tests MRI on 05/26/21 IMPRESSION:  1. Multilevel lumbar spondylosis, as detailed above.  2. Severe canal stenosis at L3-L4.  3. Moderate canal stenosis at L2-L3.  4. Mild-to-moderate canal stenosis with  moderate-severe left  foraminal stenosis at L4-L5.    Patient Stated Goals "get back into my apartment- negotiate steps, walk without walker"    Currently in Pain? No/denies                     TREATMENT: Warm up on cross trainer level 3 x4 min with cues to keep steps per minute >60 for cardiovascular conditioning; Patient consistently able to keep speed >65;  Following exercise, SPo2 97%, HR 152 bpm with increased shortness of breath reported;  Required min A to get on/off machine;  Leg Press: (Plate extended for better positioning) BLE 85# 2x10 reps; Required min A for positioning including to improve foot placement and to slow down LE movement for better motor control;  Attempted heel raises at 55#  but  patient unable to maintain position and attempt heel raise due to neuropathy and weakness; Patient required min A to get on/off machine;     Standing next to support bar:  3# ankle weight: -Hip flexion march x10 reps each LE with rail assist -hip abduction x10 reps with cues to keep foot oriented forward and avoid lateral lean for better hip strengthening -hip extension x10 reps with cues to keep erect posture X2 sets each; Patient does require cues to reduce UE leaning for fingertip hold to challenge increased LE weight bearing; Patient does require short seated rest break after each set;  Patient does fatigue quickly with increased shortness of breath, vitals assessed: HR 140, SpO2 98%  Sitting: Heel raises 3# 2x15 reps;  Patient ascend/descend 6 in step with BUE rail assist x10 reps leading with each LE, reports moderate difficulty, CGA for safety, Vitals after SPo2 97%, HR 135bpm  Forward/backward step over orange hurdle with 1 rail assist x5 reps each direction Side stepping over orange hurdle with 1 rail assist x5 reps each direction; Reports moderate difficulty, reports some difficulty with just having 1 UE but states that it was a challenge in general trying to negotiate the obstacle.      Patient reports moderate difficulty with exercise with increased fatigue and shortness of breath, vitals are WNL. No increase in pain reported;  Reinforced importance of HEP adherence, advanced to 2 sets of 10 with all exercise;                             PT Education - 08/02/21 0806     Education Details exercise technique, HEP    Person(s) Educated Patient    Methods Explanation;Verbal cues    Comprehension Verbalized understanding;Returned demonstration;Verbal cues required;Need further instruction              PT Short Term Goals - 07/13/21 1548       PT SHORT TERM GOAL #1   Title Patient will be adherent to HEP at least 3x a week to improve functional  strength and balance for better safety at home.    Time 4    Period Weeks    Status New    Target Date 08/10/21      PT SHORT TERM GOAL #2   Title Patient will demonstrate improvement in foot/ankle strength by being able to complete BLE heel raise with minimal UE support for better gait safety and mobility;    Time 4    Period Weeks    Status New    Target Date 08/10/21  PT Long Term Goals - 07/13/21 1550       PT LONG TERM GOAL #1   Title Patient will increase BLE gross strength to 4+/5  especially in hip as to improve functional strength for independent gait, increased standing tolerance and increased ADL ability.    Time 8    Period Weeks    Status New    Target Date 09/07/21      PT LONG TERM GOAL #2   Title Patient will increase six minute walk test distance to >800 for progression toward community ambulator and improve gait ability    Time 8    Period Weeks    Status New    Target Date 09/07/21      PT LONG TERM GOAL #3   Title Patient will demonstrate an improved Berg Balance Score of > 42/56 as to demonstrate improved balance with ADLs such as sitting/standing and transfer balance and reduced fall risk.    Time 8    Period Weeks    Status New    Target Date 09/07/21      PT LONG TERM GOAL #4   Title Patient will reduce timed up and go to <14 seconds to reduce fall risk and demonstrate improved transfer/gait ability.    Time 8    Period Weeks    Status New    Target Date 09/07/21                   Plan - 08/02/21 0843     Clinical Impression Statement Patient motivated and participated well within session. he was instructed in advanced LE strengthening exercise. He does require min A to get on/off equipment. Patient also requires min VCS for proper positioning/exercise technique. Progressed exercise with increased repetition/resistance. Patient also instructed to reduce UE assist to finger tip hold and/or 1 HHA. Patient reports  increased challenge with increased LE weight bearing. he denies any increase in back pain. Vitals assessed and WNL with elevated HR due to cardiovascular challenge. He would benefit from additional skilled PT Intervention to improve strength, balance and mobility;    Personal Factors and Comorbidities Comorbidity 3+;Fitness;Time since onset of injury/illness/exacerbation    Comorbidities PMH Significant: Obesity, Anxiety/depression, bipolar disorder, Type 2 diabetes with neuropathy, insomnia, HTN, Lumbar stenosis    Examination-Activity Limitations Caring for Others;Carry;Locomotion Level;Squat;Stairs;Stand;Transfers    Examination-Participation Restrictions Cleaning;Community Activity;Driving;Occupation;Shop;Volunteer;Yard Work    Stability/Clinical Decision Making Stable/Uncomplicated    Rehab Potential Fair    PT Frequency 2x / week    PT Duration 8 weeks    PT Treatment/Interventions ADLs/Self Care Home Management;Cryotherapy;Moist Heat;Gait training;Stair training;Functional mobility training;Therapeutic activities;Therapeutic exercise;Balance training;Neuromuscular re-education;Patient/family education;Orthotic Fit/Training;Manual techniques    PT Next Visit Plan Continue with strength/balance    PT Home Exercise Plan 07/26/2021=Access Code: [email or web address]    Consulted and Agree with Plan of Care Patient             Patient will benefit from skilled therapeutic intervention in order to improve the following deficits and impairments:  Abnormal gait, Decreased balance, Decreased endurance, Decreased mobility, Difficulty walking, Impaired sensation, Obesity, Increased edema, Decreased activity tolerance, Decreased safety awareness, Decreased strength  Visit Diagnosis: Muscle weakness (generalized)  Other lack of coordination  Unsteadiness on feet  Difficulty in walking, not elsewhere classified     Problem List Patient Active Problem List   Diagnosis Date Noted   Lumbar disc disorder  with myelopathy 06/01/2021   Unsteady gait 05/27/2021   Fall at home, initial encounter 05/27/2021  Lower extremity weakness 05/26/2021   Obesity (BMI 35.0-39.9 without comorbidity) 05/26/2021   Anxiety 09/12/2017   Bipolar disorder (HCC) 09/12/2017   Insulin dependent diabetes mellitus 09/12/2017   Primary insomnia 09/12/2017   Pure hypercholesterolemia 09/12/2017   Recurrent major depressive disorder, in partial remission (HCC) 09/12/2017   Tobacco dependence 09/12/2017   Vitamin D deficiency 09/12/2017   DM type 2 with diabetic peripheral neuropathy (HCC) 04/06/2016   Hypercalcemia 09/13/2015   Hypogonadism in male 09/13/2015   Non morbid obesity due to excess calories 09/13/2015    Shyniece Scripter, PT, DPT 08/02/2021, 10:33 AM  [place] [facility] MAIN [facility] SERVICES [address] [place], [place], [address] Phone: [phone]   Fax:  [phone]  Name: Jerry Wheeler MRN: [id number] Date of Birth: 1975/11/28

## 2021-08-07 ENCOUNTER — Other Ambulatory Visit: Payer: Self-pay

## 2021-08-07 ENCOUNTER — Encounter: Payer: Self-pay | Admitting: Physical Therapy

## 2021-08-07 ENCOUNTER — Ambulatory Visit: Payer: Medicare Other | Admitting: Physical Therapy

## 2021-08-07 ENCOUNTER — Ambulatory Visit: Payer: Medicare Other

## 2021-08-07 DIAGNOSIS — M5106 Intervertebral disc disorders with myelopathy, lumbar region: Secondary | ICD-10-CM

## 2021-08-07 DIAGNOSIS — M6281 Muscle weakness (generalized): Secondary | ICD-10-CM | POA: Diagnosis not present

## 2021-08-07 DIAGNOSIS — R2681 Unsteadiness on feet: Secondary | ICD-10-CM

## 2021-08-07 DIAGNOSIS — R278 Other lack of coordination: Secondary | ICD-10-CM

## 2021-08-07 DIAGNOSIS — R262 Difficulty in walking, not elsewhere classified: Secondary | ICD-10-CM

## 2021-08-07 NOTE — Therapy (Signed)
[place] [facility] MAIN [facility] [address] [place], [place], [address] Phone: [phone]   Fax:  [phone]  Occupational Therapy Treatment  Patient Details  Name: Jerry Wheeler MRN: [id number] Date of Birth: 08-31-1975 Referring Provider (OT): Delle ReiningPamela Love, [place]   Encounter Date: 08/07/2021   OT End of Session - 08/07/21 0851     Visit Number 7    Number of Visits 12    Date for OT Re-Evaluation 08/23/21    OT Start Time 0845    OT Stop Time 0930    OT Time Calculation (min) 45 min    Equipment Utilized During Treatment RW    Activity Tolerance Patient tolerated treatment well    Behavior [facility] Therapy [facility] for tasks assessed/performed             Past Medical History:  Diagnosis Date   Anxiety disorder    Bipolar disorder (HCC)    Diabetes mellitus (HCC)    Diabetic peripheral neuropathy associated with type 2 diabetes mellitus (HCC)    Emphysema lung (HCC)    hospitalization 1999   High blood pressure    Major depressive disorder in partial remission (HCC)    Morbid obesity (HCC)    Osteomyelitis of great toe of left foot (HCC)    Sleep apnea    Tobacco use disorder, continuous    Ulcer of right heel (HCC)    followed by podiatry--fully healed 09/2020    Past Surgical History:  Procedure Laterality Date   DG GREAT TOE LEFT FOOT  2010   s/p I and D for osteo    There were no vitals filed for this visit.   Subjective Assessment - 08/07/21 0848     Subjective  "I had an eventful weekend with work."    Patient is accompanied by: Family member    Pertinent History poorly controlled T2DM with peripheral neuropathy, bipolar disorder, OSA who [facility] with worsening of BLE weakness and  numbness for 3 weeks progressing to inability to walk x one day. Neurology consulted and MRI spine done revealing severe canal stenosis L3/L4, moderate canal stenosis L2/L3 and mild to moderate canal stenosis  with  moderate to severe left foraminal stenosis at L4/L5.    Limitations Muscle weakness, mild BUE tremors, impaired balance and gait, hx of falling, neuropathy in BLEs    Patient Stated Goals "I want to improve my balance so I can safely be in my kitchen to cook."    Currently in Pain? No/denies    Pain Score 0-No pain    Pain Onset Yesterday            Occupational Therapy Treatment: Therapeutic Exercise: Facilitated grip strengthening with use of  hand gripper at moderate resistance (1 green band, 1 red band) to complete x5 sets 10 reps each hand.  Facilitated pinch strengthening with use of therapy resistant clothespins to target lateral and 3 point pinch of R/L hand.  Tolerated all colors each hand, completing 2 trials for each pinch type in each hand.  Practiced 360 turns between and weaving in/around cones using RW to simulate directional changes in small areas of the home, ie kitchen.  Pt completes wide slow turns but is able to keep walker within the cones and perform with min guard.  Worked forward reach for cones in standing without support and unilateral support.  Pt with poor tolerance to release hands from walker, and returns hands for balance checks  when reaching minimally outside BOS.    Self Care: Pt participated in functional reaching and item transport emptying dishes in dishwasher in ADL kitchen using RW for support.  Min vc for carrying techniques to maximize safety and stability.  OT educated pt on ways to begin increasing indep and tolerance for standing tasks in the kitchen.  Encouraged pt participate in a part of his meal set up, loading dishwasher, or putting ingredients away for his brother.  Pt verbalized understanding.   Response to Treatment: See Plan/clinical impression below.     OT Education - 08/07/21 0849     Education Details hand strengthening activities    Person(s) Educated Patient    Methods Explanation;Verbal cues;Demonstration;Tactile cues     Comprehension Verbalized understanding;Verbal cues required;Returned demonstration;Need further instruction              OT Short Term Goals - 07/14/21 2992       OT SHORT TERM GOAL #1   Title Pt will verbalize and demonstrate 3 fall prevention strategies to reduce fall risk with ADLs/IADLs.    Baseline Eval: not yet initiated    Time 3    Period Weeks    Status New    Target Date 08/02/21      OT SHORT TERM GOAL #2   Title Pt will verbalize 3 compensatory strategies to manage BUE tremors during ADL completion.    Baseline Eval: education needed    Time 3    Period Weeks    Status New    Target Date 08/02/21               OT Long Term Goals - 07/14/21 0841       OT LONG TERM GOAL #1   Title Pt will increase FOTO score to 60 or better to indicate increased functional performance.    Baseline Eval: FOTO 51.    Time 6    Period Weeks    Status New    Target Date 08/23/21      OT LONG TERM GOAL #2   Title Pt will increase functional reach to >10" without UE support to decrease fall risk with functional reaching during daily tasks.    Baseline Eval: Functional reach 9" without AD, 13" with 1 hand on support surface.    Time 6    Period Weeks    Status New    Target Date 08/25/21      OT LONG TERM GOAL #3   Title Pt will increase 360 degree turn test score to WNL (4 secs or less) to indicate improved dynamic standing/decreased fall risk within small spaces for kitchen mobility.    Baseline Eval: 360 turn test score 7 sec with RW    Time 6    Period Weeks    Status New    Target Date 08/23/21      OT LONG TERM GOAL #4   Title Pt will be modified indep to perform hot meal prep at stove top.    Baseline Eval: Dep for hot meal prep, using microwave only and preparing cold meals d/t limited ability to release hands from walker.    Time 6    Period Weeks    Status New    Target Date 08/23/21      OT LONG TERM GOAL #5   Title Pt will manage laundry tasks with  modified indep.    Baseline Eval: max A; brother currently managing pt's laundry    Time 6  Period Weeks    Status New    Target Date 08/23/21      Long Term Additional Goals   Additional Long Term Goals Yes      OT LONG TERM GOAL #6   Title Pt will increase bilat grip strength by 10 or more lbs to improve ability to hold and carry ADL supplies.    Baseline Eval: R grip strength 66 lbs, L 68 (~20 below normal range for pt's age and gender)    Time 6    Period Weeks    Status New    Target Date 08/23/21              Plan - 08/07/21 1255     Clinical Impression Statement Pt required 2 seated rest breaks d/t LE weakness to empty dishwasher which was ~25% full.  HR also increases to 118 with this activity, but returns to high 80s with several min rest.  Pt required min vc for carrying techniques to maximize safety and stability.  Worked forward reach for cones in standing without support and unilateral support.  Pt with poor tolerance to release hands from walker, and returns hands for balance checks when reaching minimally outside BOS.  Pt will continue to benefit from skilled OT to address standing tolerance and standing balance to maximize indep with daily tasks while reducing fall risk.    OT Occupational Profile and History Problem Focused Assessment - Including review of records relating to presenting problem    Occupational performance deficits (Please refer to evaluation for details): ADL's;IADL's    Body Structure / Function / Physical Skills ADL;Coordination;Endurance;GMC;UE functional use;Balance;Sensation;Body mechanics;Decreased knowledge of use of DME;IADL;Pain;Dexterity;FMC;Strength;Gait;Mobility    Rehab Potential Good    Clinical Decision Making Limited treatment options, no task modification necessary    Comorbidities Affecting Occupational Performance: May have comorbidities impacting occupational performance    Modification or Assistance to Complete Evaluation  No  modification of tasks or assist necessary to complete eval    OT Frequency 2x / week    OT Duration 6 weeks    OT Treatment/Interventions Self-care/ADL training;Therapeutic exercise;DME and/or AE instruction;Functional Mobility Training;Balance training;Energy conservation;Therapeutic activities;Patient/family education    Consulted and Agree with Plan of Care Patient             Patient will benefit from skilled therapeutic intervention in order to improve the following deficits and impairments:   Body Structure / Function / Physical Skills: ADL, Coordination, Endurance, GMC, UE functional use, Balance, Sensation, Body mechanics, Decreased knowledge of use of DME, IADL, Pain, Dexterity, FMC, Strength, Gait, Mobility       Visit Diagnosis: Muscle weakness (generalized)  Other lack of coordination  Lumbar disc disorder with myelopathy    Problem List Patient Active Problem List   Diagnosis Date Noted   Lumbar disc disorder with myelopathy 06/01/2021   Unsteady gait 05/27/2021   Fall at home, initial encounter 05/27/2021   Lower extremity weakness 05/26/2021   Obesity (BMI 35.0-39.9 without comorbidity) 05/26/2021   Anxiety 09/12/2017   Bipolar disorder (HCC) 09/12/2017   Insulin dependent diabetes mellitus 09/12/2017   Primary insomnia 09/12/2017   Pure hypercholesterolemia 09/12/2017   Recurrent major depressive disorder, in partial remission (HCC) 09/12/2017   Tobacco dependence 09/12/2017   Vitamin D deficiency 09/12/2017   DM type 2 with diabetic peripheral neuropathy (HCC) 04/06/2016   Hypercalcemia 09/13/2015   Hypogonadism in male 09/13/2015   Non morbid obesity due to excess calories 09/13/2015   Danelle Earthly, MS,  OTR/L  O[employer] Dials, OT 08/07/2021, 12:56 PM  [facility] MAIN [facility] SERVICES [address] [place], [place], [address] Phone: [phone]   Fax:  [phone]  Name: Jerry Wheeler MRN:  [id number] Date of Birth: 02/16/1976

## 2021-08-07 NOTE — Therapy (Signed)
[place] [facility] MAIN [facility] [address] [place], [place], [address] Phone: [phone]   Fax:  [phone]  Physical Therapy Treatment  Patient Details  Name: Jerry Wheeler MRN: [id number] Date of Birth: [birth year] Referring Provider (PT): Delle ReiningPamela Love PA/ Burnadette PopLinthavong PCP   Encounter Date: 08/07/2021   PT End of Session - 08/07/21 0817     Visit Number 7    Number of Visits 17    Date for PT Re-Evaluation 09/07/21    Authorization Type medicare    PT Start Time 0806    PT Stop Time 0845    PT Time Calculation (min) 39 min    Equipment Utilized During Treatment Gait belt    Activity Tolerance Patient limited by fatigue    Behavior During Therapy WFL for tasks assessed/performed             Past Medical History:  Diagnosis Date   Anxiety disorder    Bipolar disorder (HCC)    Diabetes mellitus (HCC)    Diabetic peripheral neuropathy associated with type 2 diabetes mellitus (HCC)    Emphysema lung (HCC)    hospitalization 1999   High blood pressure    Major depressive disorder in partial remission (HCC)    Morbid obesity (HCC)    Osteomyelitis of great toe of left foot (HCC)    Sleep apnea    Tobacco use disorder, continuous    Ulcer of right heel (HCC)    followed by podiatry--fully healed 09/2020    Past Surgical History:  Procedure Laterality Date   DG GREAT TOE LEFT FOOT  2010   s/p I and D for osteo    There were no vitals filed for this visit.   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 He reports not having time to work on walking or HEP. Reports a little stiffness in the low back, no change in numbness. He still has not scheduled a follow up appointment with neurosurgeon.    Pertinent History 46 yo Male admitted to the hospital 05/26/2021 - [facility] with lower extremity weakness secondary to peripheral neuropathy  and severe spinal stenosis seen on MRI on 05/26/2021. [facility] 06/[facility]/2021 - 06/09/2021. He was discharged to his brother's home. Prior to weakness he was in a 2 story apartment with stairs to enter. Therefore he is currently living at his brothers home which is 2 story but living is on the main floor; he does have 4 steps to enter home with B railings; He reports navigating those steps well. He is currenlty ambulating with RW, able to take a few steps without walker. Reports falling on 12/9 prior to admittance but otherwise no new falls. He denies any significant numbness/tingling; He does have neuropathy in the feet from diabetes but he doesn't feel that it has worsened. He does have an appointment with Dr. Adriana Simasook (neurosurgeon); He is not sure that they will recommend surgery due to uncontrolled diabetes and concern that surgery may not improve condition;    How long can you sit comfortably? several hours    How long can you stand comfortably? Unsure- able to stand at least 5 min but hasn't tested it.    How long can you walk comfortably? about 500 feet with fatigue, does use RW    Diagnostic tests MRI on 05/26/21 IMPRESSION:  1. Multilevel lumbar spondylosis, as detailed  above.  2. Severe canal stenosis at L3-L4.  3. Moderate canal stenosis at L2-L3.  4. Mild-to-moderate canal stenosis with moderate-severe left  foraminal stenosis at L4-L5.    Patient Stated Goals "get back into my apartment- negotiate steps, walk without walker"    Currently in Pain? No/denies    Multiple Pain Sites No                  TREATMENT: Warm up on cross trainer level 3 x3 min with cues to keep steps per minute >60 for cardiovascular conditioning; Patient consistently able to keep speed >65;  Following exercise, SPo2 98%, HR 132 bpm with increased shortness of breath reported;  Required min A to get on/off machine;   Leg Press: (Plate extended for better positioning) BLE 85# 2x12 reps;  Required min A for positioning including to improve foot placement and to slow down LE movement for better motor control;  Patient required min A to get on/off machine;      Standing at steps: -Hip flexion march against green tband lifting LE to 3rd step with BUE rail assist 2x10 each LE, reports moderate difficulty, required min VCs for correct exercise technique; Vitals after exercise, Spo2 99%, HR 131 bpm;  Standing next to support bar:  3# ankle weight: -hip abduction x12 reps with cues to keep foot oriented forward and avoid lateral lean for better hip strengthening -hip extension x12 reps with cues to keep erect posture X2 sets each; Patient does require cues to reduce UE leaning for fingertip hold to challenge increased LE weight bearing; Patient does require short seated rest break after each set;  Patient does fatigue quickly with increased shortness of breath, vitals assessed: HR 132, SpO2 98%   Sitting: Heel raises 3# 2x15 reps; LAQ x5-10 reps each LE to help alleviate knee discomfort;   Patient ascend/descend 6 in step with BUE rail assist x10 reps leading with each LE, reports moderate difficulty, CGA for safety, Vitals after SPo2 99%, HR 132 bpm Initially patient reports tweaking RLE knee after 2 reps, sat down and did LAQ for knee ROM to help with recovery;     Instructed patient in lumbopelvic stretch: Seated with feet apart: Pball roll out forward for lumbar flexion x5 reps x2 sets Pball roll out side/side for QL stretch x3 reps each direction;    Patient reports moderate difficulty with exercise with increased fatigue and shortness of breath, vitals are WNL. No increase in pain reported;  Reinforced importance of HEP adherence, Recommend patient increase activity at home to improve strength and cardiovascular endurance;                                 PT Education - 08/07/21 0817     Education Details exercise technique/HEP    Person(s)  Educated Patient    Methods Explanation;Verbal cues    Comprehension Verbalized understanding;Returned demonstration;Verbal cues required;Need further instruction              PT Short Term Goals - 07/13/21 1548       PT SHORT TERM GOAL #1   Title Patient will be adherent to HEP at least 3x a week to improve functional strength and balance for better safety at home.    Time 4    Period Weeks    Status New    Target Date 08/10/21      PT SHORT TERM GOAL #2  Title Patient will demonstrate improvement in foot/ankle strength by being able to complete BLE heel raise with minimal UE support for better gait safety and mobility;    Time 4    Period Weeks    Status New    Target Date 08/10/21               PT Long Term Goals - 07/13/21 1550       PT LONG TERM GOAL #1   Title Patient will increase BLE gross strength to 4+/5  especially in hip as to improve functional strength for independent gait, increased standing tolerance and increased ADL ability.    Time 8    Period Weeks    Status New    Target Date 09/07/21      PT LONG TERM GOAL #2   Title Patient will increase six minute walk test distance to >800 for progression toward community ambulator and improve gait ability    Time 8    Period Weeks    Status New    Target Date 09/07/21      PT LONG TERM GOAL #3   Title Patient will demonstrate an improved Berg Balance Score of > 42/56 as to demonstrate improved balance with ADLs such as sitting/standing and transfer balance and reduced fall risk.    Time 8    Period Weeks    Status New    Target Date 09/07/21      PT LONG TERM GOAL #4   Title Patient will reduce timed up and go to <14 seconds to reduce fall risk and demonstrate improved transfer/gait ability.    Time 8    Period Weeks    Status New    Target Date 09/07/21                   Plan - 08/07/21 0829     Clinical Impression Statement Patient motivated and participated well within  session. He reports not being able to work on HEP over the weekend and states he didn't do much walking. He reports doing  a lot of sitting and as a result is feeling more stiffness in lumbar spine. Patient instructed in advanced LE strengthening exercise, progressing exercise with increased repetition. Patient does require min VCs for proper exercise technique/positioning; He continues to get short of breath with minimal exercise, likely due to poor cardiovascular conditioning. Vitals monitored with good SPo2 levels, elevated HR which would be expected. Patient instructed to work on increasing adherence with HEP for better progression of activity tolerance. Patient would benefit from additional skilled PT intervention to improve strength, balance and mobility;    Personal Factors and Comorbidities Comorbidity 3+;Fitness;Time since onset of injury/illness/exacerbation    Comorbidities PMH Significant: Obesity, Anxiety/depression, bipolar disorder, Type 2 diabetes with neuropathy, insomnia, HTN, Lumbar stenosis    Examination-Activity Limitations Caring for Others;Carry;Locomotion Level;Squat;Stairs;Stand;Transfers    Examination-Participation Restrictions Cleaning;Community Activity;Driving;Occupation;Shop;Volunteer;Yard Work    Stability/Clinical Decision Making Stable/Uncomplicated    Rehab Potential Fair    PT Frequency 2x / week    PT Duration 8 weeks    PT Treatment/Interventions ADLs/Self Care Home Management;Cryotherapy;Moist Heat;Gait training;Stair training;Functional mobility training;Therapeutic activities;Therapeutic exercise;Balance training;Neuromuscular re-education;Patient/family education;Orthotic Fit/Training;Manual techniques    PT Next Visit Plan Continue with strength/balance    PT Home Exercise Plan 07/26/2021=Access Code: [email or web address]    Consulted and Agree with Plan of Care Patient             Patient will benefit from skilled therapeutic intervention in  order to improve the  following deficits and impairments:  Abnormal gait, Decreased balance, Decreased endurance, Decreased mobility, Difficulty walking, Impaired sensation, Obesity, Increased edema, Decreased activity tolerance, Decreased safety awareness, Decreased strength  Visit Diagnosis: Muscle weakness (generalized)  Other lack of coordination  Lumbar disc disorder with myelopathy  Unsteadiness on feet  Difficulty in walking, not elsewhere classified     Problem List Patient Active Problem List   Diagnosis Date Noted   Lumbar disc disorder with myelopathy 06/01/2021   Unsteady gait 05/27/2021   Fall at home, initial encounter 05/27/2021   Lower extremity weakness 05/26/2021   Obesity (BMI 35.0-39.9 without comorbidity) 05/26/2021   Anxiety 09/12/2017   Bipolar disorder (HCC) 09/12/2017   Insulin dependent diabetes mellitus 09/12/2017   Primary insomnia 09/12/2017   Pure hypercholesterolemia 09/12/2017   Recurrent major depressive disorder, in partial remission (HCC) 09/12/2017   Tobacco dependence 09/12/2017   Vitamin D deficiency 09/12/2017   DM type 2 with diabetic peripheral neuropathy (HCC) 04/06/2016   Hypercalcemia 09/13/2015   Hypogonadism in male 09/13/2015   Non morbid obesity due to excess calories 09/13/2015    Jerry Wheeler, PT, DPT 08/07/2021, 8:48 AM  [place] MAIN [facility] SERVICES [address] [place], [place], [address] Phone: [phone]   Fax:  [phone]  Name: Jerry Wheeler MRN: [id number] Date of Birth: 1975-11-12

## 2021-08-09 ENCOUNTER — Other Ambulatory Visit: Payer: Self-pay

## 2021-08-09 ENCOUNTER — Ambulatory Visit: Payer: Medicare Other

## 2021-08-09 DIAGNOSIS — M6281 Muscle weakness (generalized): Secondary | ICD-10-CM | POA: Diagnosis not present

## 2021-08-09 DIAGNOSIS — R2681 Unsteadiness on feet: Secondary | ICD-10-CM

## 2021-08-09 DIAGNOSIS — M5106 Intervertebral disc disorders with myelopathy, lumbar region: Secondary | ICD-10-CM

## 2021-08-09 DIAGNOSIS — R278 Other lack of coordination: Secondary | ICD-10-CM

## 2021-08-09 DIAGNOSIS — R2689 Other abnormalities of gait and mobility: Secondary | ICD-10-CM

## 2021-08-09 NOTE — Therapy (Signed)
[place] [facility] MAIN [facility] SERVICES [address] [place], [place], [address] Phone: [phone]   Fax:  [phone]  Occupational Therapy Treatment  Patient Details  Name: Jerry Wheeler MRN: [id number] Date of Birth: [birth year] Referring Provider (OT): Delle Reining, [place]   Encounter Date: 08/09/2021   OT End of Session - 08/09/21 1926     Visit Number 8    Number of Visits 12    Date for OT Re-Evaluation 08/23/21    OT Start Time 1105    OT Stop Time 1144    OT Time Calculation (min) 39 min    Equipment Utilized During Treatment RW    Activity Tolerance Patient tolerated treatment well    Behavior During Therapy WFL for tasks assessed/performed             Past Medical History:  Diagnosis Date   Anxiety disorder    Bipolar disorder (HCC)    Diabetes mellitus (HCC)    Diabetic peripheral neuropathy associated with type 2 diabetes mellitus (HCC)    Emphysema lung (HCC)    hospitalization 1999   High blood pressure    Major depressive disorder in partial remission (HCC)    Morbid obesity (HCC)    Osteomyelitis of great toe of left foot (HCC)    Sleep apnea    Tobacco use disorder, continuous    Ulcer of right heel (HCC)    followed by podiatry--fully healed 09/2020    Past Surgical History:  Procedure Laterality Date   DG GREAT TOE LEFT FOOT  2010   s/p I and D for osteo    There were no vitals filed for this visit.   Subjective Assessment - 08/09/21 1115     Subjective  "We had take out last night but I'm planning to try and make grilled cheese and soup tonight."    Patient is accompanied by: Family member    Pertinent History poorly controlled T2DM with peripheral neuropathy, bipolar disorder, OSA who was admitted to [facility] on 05/26/21 with worsening of BLE weakness and  numbness for 3 weeks progressing to inability to walk x one day. Neurology consulted and MRI spine done revealing severe canal stenosis L3/L4, moderate  canal stenosis L2/L3 and mild to moderate canal stenosis with  moderate to severe left foraminal stenosis at L4/L5.    Limitations Muscle weakness, mild BUE tremors, impaired balance and gait, hx of falling, neuropathy in BLEs    Patient Stated Goals "I want to improve my balance so I can safely be in my kitchen to cook."    Currently in Pain? Yes    Pain Onset Yesterday            Occupational Therapy Treatment: Therapeutic Exercise: Facilitated hand strengthening with use of hand gripper set at 23.4# to place/remove jumbo pegs from pegboard x2 trials using R/L hand and 28.9# for 3rd trial.  Rest breaks between sets.  Therapeutic Activity: Pt participated in functional reaching activity in standing, using RW to reach for magnets on whiteboard and moving magnets L<>R slightly outside BOS, stepped back to reach forward to whiteboard slightly outside BOS.  Worked unilateral support, alternating reaching with R and L arms, and then progressed to no UE support reaching with either arm.  Pt performed reaching in all directions with unilateral support and close SBA, but required min guard when reaching without UE support.    Response to Treatment: See Plan/clinical impression below.    OT Education - 08/09/21 1925  Education Details safety/walker positioning with functional reaching    Person(s) Educated Patient    Methods Explanation;Verbal cues;Demonstration;Tactile cues    Comprehension Verbalized understanding;Verbal cues required;Returned demonstration;Need further instruction              OT Short Term Goals - 07/14/21 0998       OT SHORT TERM GOAL #1   Title Pt will verbalize and demonstrate 3 fall prevention strategies to reduce fall risk with ADLs/IADLs.    Baseline Eval: not yet initiated    Time 3    Period Weeks    Status New    Target Date 08/02/21      OT SHORT TERM GOAL #2   Title Pt will verbalize 3 compensatory strategies to manage BUE tremors during ADL  completion.    Baseline Eval: education needed    Time 3    Period Weeks    Status New    Target Date 08/02/21               OT Long Term Goals - 07/14/21 0841       OT LONG TERM GOAL #1   Title Pt will increase FOTO score to 60 or better to indicate increased functional performance.    Baseline Eval: FOTO 51.    Time 6    Period Weeks    Status New    Target Date 08/23/21      OT LONG TERM GOAL #2   Title Pt will increase functional reach to >10" without UE support to decrease fall risk with functional reaching during daily tasks.    Baseline Eval: Functional reach 9" without AD, 13" with 1 hand on support surface.    Time 6    Period Weeks    Status New    Target Date 08/25/21      OT LONG TERM GOAL #3   Title Pt will increase 360 degree turn test score to WNL (4 secs or less) to indicate improved dynamic standing/decreased fall risk within small spaces for kitchen mobility.    Baseline Eval: 360 turn test score 7 sec with RW    Time 6    Period Weeks    Status New    Target Date 08/23/21      OT LONG TERM GOAL #4   Title Pt will be modified indep to perform hot meal prep at stove top.    Baseline Eval: Dep for hot meal prep, using microwave only and preparing cold meals d/t limited ability to release hands from walker.    Time 6    Period Weeks    Status New    Target Date 08/23/21      OT LONG TERM GOAL #5   Title Pt will manage laundry tasks with modified indep.    Baseline Eval: max A; brother currently managing pt's laundry    Time 6    Period Weeks    Status New    Target Date 08/23/21      Long Term Additional Goals   Additional Long Term Goals Yes      OT LONG TERM GOAL #6   Title Pt will increase bilat grip strength by 10 or more lbs to improve ability to hold and carry ADL supplies.    Baseline Eval: R grip strength 66 lbs, L 68 (~20 below normal range for pt's age and gender)    Time 6    Period Weeks    Status New  Target Date 08/23/21               Plan - 08/09/21 1944     Clinical Impression Statement Pt reports he did not work in the kitchen last night d/t having take out, but plans to make a grilled cheese and soup this evening.  Pt was able to increase grip strength setting by 1 level for R/L hands with use of hand gripper to remove jumbo pegs from board.  Pt's dynamic standing balance and functional reaching R/L and forward continue to improve.  Pt can reach minimally outside BOS R/L and forward without UE support with min guard, but was consistent to perform with SBA when reaching with unilateral support on RW.  Pt will continue to benefit from skilled OT to address standing tolerance and standing balance to maximize indep with daily tasks while reducing fall risk, while increasing bilat grip strength for better hold and manipulation of ADL supplies.    OT Occupational Profile and History Problem Focused Assessment - Including review of records relating to presenting problem    Occupational performance deficits (Please refer to evaluation for details): ADL's;IADL's    Body Structure / Function / Physical Skills ADL;Coordination;Endurance;GMC;UE functional use;Balance;Sensation;Body mechanics;Decreased knowledge of use of DME;IADL;Pain;Dexterity;FMC;Strength;Gait;Mobility    Rehab Potential Good    Clinical Decision Making Limited treatment options, no task modification necessary    Comorbidities Affecting Occupational Performance: May have comorbidities impacting occupational performance    Modification or Assistance to Complete Evaluation  No modification of tasks or assist necessary to complete eval    OT Frequency 2x / week    OT Duration 6 weeks    OT Treatment/Interventions Self-care/ADL training;Therapeutic exercise;DME and/or AE instruction;Functional Mobility Training;Balance training;Energy conservation;Therapeutic activities;Patient/family education    Consulted and Agree with Plan of Care Patient              Patient will benefit from skilled therapeutic intervention in order to improve the following deficits and impairments:   Body Structure / Function / Physical Skills: ADL, Coordination, Endurance, GMC, UE functional use, Balance, Sensation, Body mechanics, Decreased knowledge of use of DME, IADL, Pain, Dexterity, FMC, Strength, Gait, Mobility       Visit Diagnosis: Muscle weakness (generalized)  Other lack of coordination  Lumbar disc disorder with myelopathy    Problem List Patient Active Problem List   Diagnosis Date Noted   Lumbar disc disorder with myelopathy 06/01/2021   Unsteady gait 05/27/2021   Fall at home, initial encounter 05/27/2021   Lower extremity weakness 05/26/2021   Obesity (BMI 35.0-39.9 without comorbidity) 05/26/2021   Anxiety 09/12/2017   Bipolar disorder (HCC) 09/12/2017   Insulin dependent diabetes mellitus 09/12/2017   Primary insomnia 09/12/2017   Pure hypercholesterolemia 09/12/2017   Recurrent major depressive disorder, in partial remission (HCC) 09/12/2017   Tobacco dependence 09/12/2017   Vitamin D deficiency 09/12/2017   DM type 2 with diabetic peripheral neuropathy (HCC) 04/06/2016   Hypercalcemia 09/13/2015   Hypogonadism in male 09/13/2015   Non morbid obesity due to excess calories 09/13/2015   Danelle Earthly, MS, OTR/L  Otis Dials, OT 08/09/2021, 7:45 PM  [facility] MAIN [facility] SERVICES [address] [place], [place], [address] Phone: [phone]   Fax:  [phone]  Name: Jerry Wheeler MRN: [id number] Date of Birth: April 29, 1976

## 2021-08-09 NOTE — Therapy (Signed)
[facility] [address] [place], [place], [address] Phone: [phone]   Fax:  [phone]  Physical Therapy Treatment  Patient Details  Name: Jerry GamblerJonathan Wheeler MRN: [id number] Date of Birth: 08/17/1975 Referring Provider (PT): Delle ReiningPamela Love PA/ Burnadette PopLinthavong PCP   Encounter Date: 08/09/2021   PT End of Session - 08/09/21 1400     Visit Number 8    Number of Visits 17    Date for PT Re-Evaluation 09/07/21    Authorization Type medicare    PT Start Time 1147    PT Stop Time 1228    PT Time Calculation (min) 41 min    Equipment Utilized During Treatment Gait belt    Activity Tolerance Patient limited by fatigue    Behavior During Therapy WFL for tasks assessed/performed             Past Medical History:  Diagnosis Date   Anxiety disorder    Bipolar disorder (HCC)    Diabetes mellitus (HCC)    Diabetic peripheral neuropathy associated with type 2 diabetes mellitus (HCC)    Emphysema lung (HCC)    hospitalization 1999   High blood pressure    Major depressive disorder in partial remission (HCC)    Morbid obesity (HCC)    Osteomyelitis of great toe of left foot (HCC)    Sleep apnea    Tobacco use disorder, continuous    Ulcer of right heel (HCC)    followed by podiatry--fully healed 09/2020    Past Surgical History:  Procedure Laterality Date   DG GREAT TOE LEFT FOOT  2010   s/p I and D for osteo    There were no vitals filed for this visit.   Subjective Assessment - 08/09/21 1134     Subjective Pt reports legs are a little sore. Pt denies any falls or LOB. Pt reports exercises are going well at home.    Pertinent History 46 yo Male admitted to the hospital 05/26/2021 [facility] with lower extremity weakness secondary to peripheral neuropathy and severe spinal stenosis seen on MRI on 05/26/2021. [facility] - 06/09/2021. He was discharged to his brother's home.  Prior to weakness he was in a 2 story apartment with stairs to enter. Therefore he is currently living at his brothers home which is 2 story but living is on the main floor; he does have 4 steps to enter home with B railings; He reports navigating those steps well. He is currenlty ambulating with RW, able to take a few steps without walker. Reports falling on 12/9 prior to admittance but otherwise no new falls. He denies any significant numbness/tingling; He does have neuropathy in the feet from diabetes but he doesn't feel that it has worsened. He does have an appointment with Dr. Adriana Simasook (neurosurgeon); He is not sure that they will recommend surgery due to uncontrolled diabetes and concern that surgery may not improve condition;    How long can you sit comfortably? several hours    How long can you stand comfortably? Unsure- able to stand at least 5 min but hasn't tested it.    How long can you walk comfortably? about 500 feet with fatigue, does use RW    Diagnostic tests MRI on 05/26/21 IMPRESSION:  1. Multilevel lumbar spondylosis, as detailed above.  2. Severe canal stenosis at L3-L4.  3. Moderate canal stenosis at L2-L3.  4. Mild-to-moderate canal stenosis with moderate-severe left  foraminal stenosis at L4-L5.    Patient Stated Goals "get back into my apartment- negotiate steps, walk without walker"    Currently in Pain? Yes    Pain Location Leg    Pain Orientation Right;Left    Pain Descriptors / Indicators Sore    Pain Onset Yesterday             Interventions   Leg Press: (Plate extended for better positioning) BLE 85# 1x12, 2x10, reps; Required min A for positioning including to improve foot placement;  Patient required min A to get on/off machine; Pt rates intervention as medium-hard. Pt reports some lightheaded sensation with intervention.    In // bars, gait belt donned and CGA provided unless otherwise specified: -Hip flexion march with 3# weights donned eahc LE 1x10, 1x12 each  LE, reports moderate difficulty, heavy UE weightbearing   3# ankle weight: -hip abduction 1x12, 2x6 reps with continued cues to keep foot oriented forward and avoid lateral lean  -hip extension 2x13 reps  Patient does fatigue quickly, requires rest breaks throughout.  Sitting: Heel raises 3# 1x18, 1x25 reps; Pt rates as easy with first set and starting to feel medium with second set 3# donned each LE LAQ 1x5 rates easy-medium, 2x12 each LE; pt rates medium DF 2x15 each LE; pt rates medium    Nustep level 0 (seat 16) x 5 min, cuing for SPM 60s-90s. Close CGA for mount/dismount. Pt monitored throughout for response to intervention. Cuing to decrease speed to modify exercise response (avoid breathlessness). Cuing to adjust speed to moderate challenge and for cool-down minute for safety.   Pt educated throughout session about proper posture and technique with exercises. Improved exercise technique, movement at target joints, use of target muscles after min to mod verbal, visual, tactile cues.  Note: Portions of this document were prepared using Dragon voice recognition software and although reviewed may contain unintentional dictation errors in syntax, grammar, or spelling.      PT Education - 08/09/21 1400     Education Details Exercise technique, Optometrist    Person(s) Educated Patient    Methods Explanation;Demonstration;Verbal cues    Comprehension Verbalized understanding;Returned demonstration;Need further instruction              PT Short Term Goals - 07/13/21 1548       PT SHORT TERM GOAL #1   Title Patient will be adherent to HEP at least 3x a week to improve functional strength and balance for better safety at home.    Time 4    Period Weeks    Status New    Target Date 08/10/21      PT SHORT TERM GOAL #2   Title Patient will demonstrate improvement in foot/ankle strength by being able to complete BLE heel raise with minimal UE support for better gait safety and  mobility;    Time 4    Period Weeks    Status New    Target Date 08/10/21               PT Long Term Goals - 07/13/21 1550       PT LONG TERM GOAL #1   Title Patient will increase BLE gross strength to 4+/5  especially in hip as to improve functional strength for independent gait, increased standing tolerance and increased ADL ability.    Time 8    Period Weeks    Status New    Target Date 09/07/21      PT  LONG TERM GOAL #2   Title Patient will increase six minute walk test distance to >800 for progression toward community ambulator and improve gait ability    Time 8    Period Weeks    Status New    Target Date 09/07/21      PT LONG TERM GOAL #3   Title Patient will demonstrate an improved Berg Balance Score of > 42/56 as to demonstrate improved balance with ADLs such as sitting/standing and transfer balance and reduced fall risk.    Time 8    Period Weeks    Status New    Target Date 09/07/21      PT LONG TERM GOAL #4   Title Patient will reduce timed up and go to <14 seconds to reduce fall risk and demonstrate improved transfer/gait ability.    Time 8    Period Weeks    Status New    Target Date 09/07/21                   Plan - 08/09/21 1400     Clinical Impression Statement Patient presents to PT session with excellent motivation.  The patient was able to advance interventions by performing increased repetition with exercises.  This indicates improved activity tolerance and lower extremity endurance.  The pt still requires frequent rest breaks throughout, however.  The patient will benefit from further skilled PT intervention to improve strength, balance and mobility.    Personal Factors and Comorbidities Comorbidity 3+;Fitness;Time since onset of injury/illness/exacerbation    Comorbidities PMH Significant: Obesity, Anxiety/depression, bipolar disorder, Type 2 diabetes with neuropathy, insomnia, HTN, Lumbar stenosis    Examination-Activity Limitations  Caring for Others;Carry;Locomotion Level;Squat;Stairs;Stand;Transfers    Examination-Participation Restrictions Cleaning;Community Activity;Driving;Occupation;Shop;Volunteer;Yard Work    Stability/Clinical Decision Making Stable/Uncomplicated    Rehab Potential Fair    PT Frequency 2x / week    PT Duration 8 weeks    PT Treatment/Interventions ADLs/Self Care Home Management;Cryotherapy;Moist Heat;Gait training;Stair training;Functional mobility training;Therapeutic activities;Therapeutic exercise;Balance training;Neuromuscular re-education;Patient/family education;Orthotic Fit/Training;Manual techniques    PT Next Visit Plan Continue with strength/balance    PT Home Exercise Plan 07/26/2021=Access Code: [email or web address]; no updates    Consulted and Agree with Plan of Care Patient             Patient will benefit from skilled therapeutic intervention in order to improve the following deficits and impairments:  Abnormal gait, Decreased balance, Decreased endurance, Decreased mobility, Difficulty walking, Impaired sensation, Obesity, Increased edema, Decreased activity tolerance, Decreased safety awareness, Decreased strength  Visit Diagnosis: Muscle weakness (generalized)  Other abnormalities of gait and mobility  Unsteadiness on feet     Problem List Patient Active Problem List   Diagnosis Date Noted   Lumbar disc disorder with myelopathy 06/01/2021   Unsteady gait 05/27/2021   Fall at home, initial encounter 05/27/2021   Lower extremity weakness 05/26/2021   Obesity (BMI 35.0-39.9 without comorbidity) 05/26/2021   Anxiety 09/12/2017   Bipolar disorder (HCC) 09/12/2017   Insulin dependent diabetes mellitus 09/12/2017   Primary insomnia 09/12/2017   Pure hypercholesterolemia 09/12/2017   Recurrent major depressive disorder, in partial remission (HCC) 09/12/2017   Tobacco dependence 09/12/2017   Vitamin D deficiency 09/12/2017   DM type 2 with diabetic peripheral neuropathy (HCC)  04/06/2016   Hypercalcemia 09/13/2015   Hypogonadism in male 09/13/2015   Non morbid obesity due to excess calories 09/13/2015    Baird Kay, PT 08/09/2021, 2:08 PM  [facility] MA[facility] REHAB  SERVICES [address] [place], [place], [address] Phone: [phone]   Fax:  [phone]  Name: Chanon Croes MRN: [id number] Date of Birth: 1976-05-06

## 2021-08-14 ENCOUNTER — Ambulatory Visit: Payer: Medicare Other

## 2021-08-14 ENCOUNTER — Other Ambulatory Visit: Payer: Self-pay

## 2021-08-14 ENCOUNTER — Ambulatory Visit: Payer: Medicare Other | Admitting: Physical Therapy

## 2021-08-14 ENCOUNTER — Encounter: Payer: Self-pay | Admitting: Physical Therapy

## 2021-08-14 DIAGNOSIS — R278 Other lack of coordination: Secondary | ICD-10-CM

## 2021-08-14 DIAGNOSIS — M5106 Intervertebral disc disorders with myelopathy, lumbar region: Secondary | ICD-10-CM

## 2021-08-14 DIAGNOSIS — R262 Difficulty in walking, not elsewhere classified: Secondary | ICD-10-CM

## 2021-08-14 DIAGNOSIS — M6281 Muscle weakness (generalized): Secondary | ICD-10-CM

## 2021-08-14 DIAGNOSIS — R2681 Unsteadiness on feet: Secondary | ICD-10-CM

## 2021-08-14 DIAGNOSIS — R2689 Other abnormalities of gait and mobility: Secondary | ICD-10-CM

## 2021-08-14 NOTE — Therapy (Signed)
[place] [facility] MAIN [facility] SERVICES [address] [place], [place], [address] Phone: [phone]   Fax:  [phone]  Occupational Therapy Treatment  Patient Details  Name: Jerry Wheeler MRN: [id number] Date of Birth: 1975-11-09 Referring Provider (OT): Delle Reining, [place]   Encounter Date: 08/14/2021   OT End of Session - 08/14/21 1217     Visit Number 9    Number of Visits 12    Date for OT Re-Evaluation 08/23/21    OT Start Time 0845    OT Stop Time 0930    OT Time Calculation (min) 45 min    Equipment Utilized During Treatment RW    Activity Tolerance Patient tolerated treatment well    Behavior During Therapy [facility] for tasks assessed/performed             Past Medical History:  Diagnosis Date   Anxiety disorder    Bipolar disorder (HCC)    Diabetes mellitus (HCC)    Diabetic peripheral neuropathy associated with type 2 diabetes mellitus (HCC)    Emphysema lung (HCC)    hospitalization 1999   High blood pressure    Major depressive disorder in partial remission (HCC)    Morbid obesity (HCC)    Osteomyelitis of great toe of left foot (HCC)    Sleep apnea    Tobacco use disorder, continuous    Ulcer of right heel (HCC)    followed by podiatry--fully healed 09/2020    Past Surgical History:  Procedure Laterality Date   DG GREAT TOE LEFT FOOT  2010   s/p I and D for osteo    There were no vitals filed for this visit.   Subjective Assessment - 08/14/21 0857     Subjective  "I emptied the dishwasher this weekend."    Patient is accompanied by: Family member    Pertinent History poorly controlled T2DM with peripheral neuropathy, bipolar disorder, OSA who was admitted to [facility] on 05/26/21 with worsening of BLE weakness and  numbness for 3 weeks progressing to inability to walk x one day. Neurology consulted and MRI spine done revealing severe canal stenosis L3/L4, moderate canal stenosis L2/L3 and mild to moderate canal stenosis  with  moderate to severe left foraminal stenosis at L4/L5.    Limitations Muscle weakness, mild BUE tremors, impaired balance and gait, hx of falling, neuropathy in BLEs    Patient Stated Goals "I want to improve my balance so I can safely be in my kitchen to cook."    Currently in Pain? No/denies    Pain Score 0-No pain    Pain Onset Yesterday            Occupational Therapy Treatment: Therapeutic Exercise: Focus on hand strengthening to increase ability to hold and manipulate ADL supplies.  Facilitated hand strengthening with use of hand gripper set at moderate resistance with 1 green band and 1 red band to complete 3 sets 10 reps of grip squeezes for each hand.  Facilitated pinch strengthening with use of therapy resistant clothespins to target lateral and 3 point pinch of R/L hand; used moderate and strong clips only to complete 2 sets of each pinch type in each hand.    Neuro re-ed: Facilitated dynamic standing writing on white board and reaching for magnets.  Challenged reaching with transitions from chest level to knee level reaching, and forward reaching with and without UE support, alternating arms.  Intermittent min guard from OT throughout dynamic standing tasks when reaching without UE  support, close supv/intermittent min guard with unilateral support.  Pt required seated rest break after each reaching task d/t LE fatigue.    Response to Treatment: See Plan/clinical impression below.     OT Education - 08/14/21 1216     Education Details walk tray recommendation; pt plans to obtain    Person(s) Educated Patient    Methods Explanation;Verbal cues    Comprehension Verbalized understanding;Verbal cues required              OT Short Term Goals - 07/14/21 4580       OT SHORT TERM GOAL #1   Title Pt will verbalize and demonstrate 3 fall prevention strategies to reduce fall risk with ADLs/IADLs.    Baseline Eval: not yet initiated    Time 3    Period Weeks    Status  New    Target Date 08/02/21      OT SHORT TERM GOAL #2   Title Pt will verbalize 3 compensatory strategies to manage BUE tremors during ADL completion.    Baseline Eval: education needed    Time 3    Period Weeks    Status New    Target Date 08/02/21               OT Long Term Goals - 07/14/21 0841       OT LONG TERM GOAL #1   Title Pt will increase FOTO score to 60 or better to indicate increased functional performance.    Baseline Eval: FOTO 51.    Time 6    Period Weeks    Status New    Target Date 08/23/21      OT LONG TERM GOAL #2   Title Pt will increase functional reach to >10" without UE support to decrease fall risk with functional reaching during daily tasks.    Baseline Eval: Functional reach 9" without AD, 13" with 1 hand on support surface.    Time 6    Period Weeks    Status New    Target Date 08/25/21      OT LONG TERM GOAL #3   Title Pt will increase 360 degree turn test score to WNL (4 secs or less) to indicate improved dynamic standing/decreased fall risk within small spaces for kitchen mobility.    Baseline Eval: 360 turn test score 7 sec with RW    Time 6    Period Weeks    Status New    Target Date 08/23/21      OT LONG TERM GOAL #4   Title Pt will be modified indep to perform hot meal prep at stove top.    Baseline Eval: Dep for hot meal prep, using microwave only and preparing cold meals d/t limited ability to release hands from walker.    Time 6    Period Weeks    Status New    Target Date 08/23/21      OT LONG TERM GOAL #5   Title Pt will manage laundry tasks with modified indep.    Baseline Eval: max A; brother currently managing pt's laundry    Time 6    Period Weeks    Status New    Target Date 08/23/21      Long Term Additional Goals   Additional Long Term Goals Yes      OT LONG TERM GOAL #6   Title Pt will increase bilat grip strength by 10 or more lbs to improve ability to hold and  carry ADL supplies.    Baseline Eval: R  grip strength 66 lbs, L 68 (~20 below normal range for pt's age and gender)    Time 21    Period Weeks    Status New    Target Date 08/23/21              Plan - 08/14/21 1240     Clinical Impression Statement Pt reports he unloaded the dishwasher this weekend and is demonstrating increased confidence with reaching in all directions with either hand with unilateral support.  Pt is still very cautious to reach without UE support and frequently returns hands to walker for balance checks.  Pt will continue to benefit from skilled OT for increasing standing tolerance and balance for maximizing indep with IADLs in standing, working towards less reliance on RW during these tasks as well.    OT Occupational Profile and History Problem Focused Assessment - Including review of records relating to presenting problem    Occupational performance deficits (Please refer to evaluation for details): ADL's;IADL's    Body Structure / Function / Physical Skills ADL;Coordination;Endurance;GMC;UE functional use;Balance;Sensation;Body mechanics;Decreased knowledge of use of DME;IADL;Pain;Dexterity;FMC;Strength;Gait;Mobility    Rehab Potential Good    Clinical Decision Making Limited treatment options, no task modification necessary    Comorbidities Affecting Occupational Performance: May have comorbidities impacting occupational performance    Modification or Assistance to Complete Evaluation  No modification of tasks or assist necessary to complete eval    OT Frequency 2x / week    OT Duration 6 weeks    OT Treatment/Interventions Self-care/ADL training;Therapeutic exercise;DME and/or AE instruction;Functional Mobility Training;Balance training;Energy conservation;Therapeutic activities;Patient/family education    Consulted and Agree with Plan of Care Patient             Patient will benefit from skilled therapeutic intervention in order to improve the following deficits and impairments:   Body Structure  / Function / Physical Skills: ADL, Coordination, Endurance, GMC, UE functional use, Balance, Sensation, Body mechanics, Decreased knowledge of use of DME, IADL, Pain, Dexterity, FMC, Strength, Gait, Mobility       Visit Diagnosis: Muscle weakness (generalized)  Other lack of coordination  Lumbar disc disorder with myelopathy    Problem List Patient Active Problem List   Diagnosis Date Noted   Lumbar disc disorder with myelopathy 06/01/2021   Unsteady gait 05/27/2021   Fall at home, initial encounter 05/27/2021   Lower extremity weakness 05/26/2021   Obesity (BMI 35.0-39.9 without comorbidity) 05/26/2021   Anxiety 09/12/2017   Bipolar disorder (HCC) 09/12/2017   Insulin dependent diabetes mellitus 09/12/2017   Primary insomnia 09/12/2017   Pure hypercholesterolemia 09/12/2017   Recurrent major depressive disorder, in partial remission (HCC) 09/12/2017   Tobacco dependence 09/12/2017   Vitamin D deficiency 09/12/2017   DM type 2 with diabetic peripheral neuropathy (HCC) 04/06/2016   Hypercalcemia 09/13/2015   Hypogonadism in male 09/13/2015   Non morbid obesity due to excess calories 09/13/2015   Danelle Earthly, MS, OTR/L  [employer], OT 08/14/2021, 12:40 PM  [place] [facility] MAIN [facility] SERVICES [address] [place], [place], [address] Phone: [phone]   Fax:  [phone]  Name: Jerry Wheeler MRN: [id number] Date of Birth: [birth year]

## 2021-08-14 NOTE — Therapy (Signed)
[place] MAIN [facility] SERVICES [address] [place], [place], [address] Phone: [phone]   Fax:  [phone]  Physical Therapy Treatment  Patient Details  Name: Jerry Wheeler MRN: [id number] Date of Birth: 1976-01-23 Referring Provider (PT): Reesa Chew PA/ Netty Starring PCP   Encounter Date: 08/14/2021   PT End of Session - 08/14/21 0821     Visit Number 9    Number of Visits 17    Date for PT Re-Evaluation 09/07/21    Authorization Type medicare    PT Start Time 0805    PT Stop Time 0845    PT Time Calculation (min) 40 min    Equipment Utilized During Treatment Gait belt    Activity Tolerance Patient limited by fatigue    Behavior During Therapy WFL for tasks assessed/performed             Past Medical History:  Diagnosis Date   Anxiety disorder    Bipolar disorder ([place])    Diabetes mellitus ([place])    Diabetic peripheral neuropathy associated with type 2 diabetes mellitus ([place])    Emphysema lung ([place])    hospitalization 1999   High blood pressure    Major depressive disorder in partial remission ([place])    Morbid obesity ([place])    Osteomyelitis of great toe of left foot ([place])    Sleep apnea    Tobacco use disorder, continuous    Ulcer of right heel ([place])    followed by podiatry--fully healed 09/2020    Past Surgical History:  Procedure Laterality Date   DG GREAT TOE LEFT FOOT  2010   s/p I and D for osteo    There were no vitals filed for this visit.   Subjective Assessment - 08/14/21 0820     Subjective Pt reports legs are a little stiff. Reports doing HEP 3-4 times a week in last few weeks. No new falls, reports numbness is about the same;    Pertinent History 46 yo Male admitted to the hospital 05/26/2021 - 06/01/2021 at [facility] with lower extremity weakness secondary to peripheral neuropathy and severe spinal stenosis seen on MRI on 05/26/2021. He was discharged to [facility] rehab 06/01/2021 - 06/09/2021. He was  discharged to his brother's home. Prior to weakness he was in a 2 story apartment with stairs to enter. Therefore he is currently living at his brothers home which is 2 story but living is on the main floor; he does have 4 steps to enter home with B railings; He reports navigating those steps well. He is currenlty ambulating with RW, able to take a few steps without walker. Reports falling on 12/9 prior to admittance but otherwise no new falls. He denies any significant numbness/tingling; He does have neuropathy in the feet from diabetes but he doesn't feel that it has worsened. He does have an appointment with Dr. Lacinda Axon (neurosurgeon); He is not sure that they will recommend surgery due to uncontrolled diabetes and concern that surgery may not improve condition;    How long can you sit comfortably? several hours    How long can you stand comfortably? Unsure- able to stand at least 5 min but hasn't tested it.    How long can you walk comfortably? about 500 feet with fatigue, does use RW    Diagnostic tests MRI on 05/26/21 IMPRESSION:  1. Multilevel lumbar spondylosis, as detailed above.  2. Severe canal stenosis at L3-L4.  3. Moderate canal stenosis at L2-L3.  4. Mild-to-moderate  canal stenosis with moderate-severe left  foraminal stenosis at L4-L5.    Patient Stated Goals "get back into my apartment- negotiate steps, walk without walker"    Currently in Pain? No/denies    Pain Onset Yesterday                [facility] PT Assessment - 08/14/21 0001       Strength   Right Hip Flexion 4+/5    Left Hip Flexion 4+/5    Right Knee Flexion 5/5    Right Knee Extension 5/5    Left Knee Flexion 5/5    Left Knee Extension 5/5    Right Ankle Dorsiflexion 2+/5    Left Ankle Dorsiflexion 2+/5      6 Minute walk- Post Test   BP (mmHg) 164/85    HR (bpm) 123    02 Sat (%RA) 97 %    Modified Borg Scale for Dyspnea 3- Moderate shortness of breath or breathing difficulty      6 minute walk test results     Aerobic Endurance Distance Walked 850    Endurance additional comments with RW, increased shortness of breath, improved from 600 feet on 07/13/21, limited community ambulator      Standardized Balance Assessment   Standardized Balance Assessment Timed Up and Go Test      Berg Balance Test   Sit to Stand Able to stand  independently using hands    Standing Unsupported Able to stand 30 seconds unsupported    Sitting with Back Unsupported but Feet Supported on Floor or Stool Able to sit safely and securely 2 minutes    Stand to Sit Controls descent by using hands    Transfers Able to transfer safely, definite need of hands    Standing Unsupported with Eyes Closed Unable to keep eyes closed 3 seconds but stays steady    Standing Unsupported with Feet Together Able to place feet together independently but unable to hold for 30 seconds    From Standing, Reach Forward with Outstretched Arm Can reach forward >12 cm safely (5")    From Standing Position, Pick up Object from Floor Unable to pick up and needs supervision    From Standing Position, Turn to Look Behind Over each Shoulder Needs supervision when turning    Turn 360 Degrees Needs assistance while turning    Standing Unsupported, Alternately Place Feet on Step/Stool Needs assistance to keep from falling or unable to try    Standing Unsupported, One Foot in Front Needs help to step but can hold 15 seconds    Standing on One Leg Tries to lift leg/unable to hold 3 seconds but remains standing independently    Total Score 25    Berg comment: <36 indicates 100% risk for falls, slight improved from 07/17/21 which was 24/56      Timed Up and Go Test   Normal TUG (seconds) 9.48    TUG Comments with RW, low risk for falls               TREATMENT: PT addressed goals, see above   Leg press: BLE 85# 2x15 Required min A to get on/off equipment  Patient is progressing fair. He does exhibit improvement in cardiovascular endurance as  evidenced by 6 min walk test. He continues to test as a high risk for falls and relies heavily on RW. He also exhibits minimal change in LE ankle strength. Neuropathy, loss of protective sensation in BLE as well as ankle weakness  is likely contributing to impaired balance and gait ability. He is scheduled to see neurosurgeon this week to determine if surgery could potentially help improve symptoms.                      PT Education - 08/14/21 0821     Education Details progress towards goals, exercise technique;    Person(s) Educated Patient    Methods Explanation;Verbal cues    Comprehension Verbalized understanding;Returned demonstration;Verbal cues required;Need further instruction              PT Short Term Goals - 08/14/21 1610       PT SHORT TERM GOAL #1   Title Patient will be adherent to HEP at least 3x a week to improve functional strength and balance for better safety at home.    Baseline 2/27: reports doing them 3-4x a week;    Time 4    Period Weeks    Status Achieved    Target Date 08/10/21      PT SHORT TERM GOAL #2   Title Patient will demonstrate improvement in foot/ankle strength by being able to complete BLE heel raise with minimal UE support for better gait safety and mobility;    Baseline 2/27: unable    Time 4    Period Weeks    Status Not Met    Target Date 08/10/21               PT Long Term Goals - 08/14/21 9604       PT LONG TERM GOAL #1   Title Patient will increase BLE gross strength to 4+/5  especially in hip as to improve functional strength for independent gait, increased standing tolerance and increased ADL ability.    Baseline 2/27: see flowsheet    Time 8    Period Weeks    Status Partially Met    Target Date 09/07/21      PT LONG TERM GOAL #2   Title Patient will increase six minute walk test distance to >800 for progression toward community ambulator and improve gait ability    Baseline 2/27: 850 feet with RW     Time 8    Period Weeks    Status Achieved    Target Date 09/07/21      PT LONG TERM GOAL #3   Title Patient will demonstrate an improved Berg Balance Score of > 42/56 as to demonstrate improved balance with ADLs such as sitting/standing and transfer balance and reduced fall risk.    Baseline 2/27: 25/56    Time 8    Period Weeks    Status Partially Met    Target Date 09/07/21      PT LONG TERM GOAL #4   Title Patient will reduce timed up and go to <14 seconds to reduce fall risk and demonstrate improved transfer/gait ability.    Baseline 2/27: 9.48 sec with RW;    Time 8    Period Weeks    Status Achieved    Target Date 09/07/21                   Plan - 08/14/21 1028     Clinical Impression Statement Patient motivated and participated well within session. Patient is progressing fair. He does exhibit improvement in cardiovascular endurance as evidenced by 6 min walk test. He continues to test as a high risk for falls and relies heavily on RW. He also exhibits minimal change in  LE ankle strength. Neuropathy, loss of protective sensation in BLE as well as ankle weakness is likely contributing to impaired balance and gait ability. He does exhibit improvement in proximal LE strength compared to start of care. He is scheduled to see neurosurgeon this week to determine if surgery could potentially help improve symptoms. He would benefit from additional skilled PT Intervention to improve strength, balance and mobility;    Personal Factors and Comorbidities Comorbidity 3+;Fitness;Time since onset of injury/illness/exacerbation    Comorbidities PMH Significant: Obesity, Anxiety/depression, bipolar disorder, Type 2 diabetes with neuropathy, insomnia, HTN, Lumbar stenosis    Examination-Activity Limitations Caring for Others;Carry;Locomotion Level;Squat;Stairs;Stand;Transfers    Examination-Participation Restrictions Cleaning;Community Activity;Driving;Occupation;Shop;Volunteer;Yard Work     Stability/Clinical Decision Making Stable/Uncomplicated    Rehab Potential Fair    PT Frequency 2x / week    PT Duration 8 weeks    PT Treatment/Interventions ADLs/Self Care Home Management;Cryotherapy;Moist Heat;Gait training;Stair training;Functional mobility training;Therapeutic activities;Therapeutic exercise;Balance training;Neuromuscular re-education;Patient/family education;Orthotic Fit/Training;Manual techniques    PT Next Visit Plan Continue with strength/balance    PT Home Exercise Plan 07/26/2021=Access Code: [email or web address]; no updates    Consulted and Agree with Plan of Care Patient             Patient will benefit from skilled therapeutic intervention in order to improve the following deficits and impairments:  Abnormal gait, Decreased balance, Decreased endurance, Decreased mobility, Difficulty walking, Impaired sensation, Obesity, Increased edema, Decreased activity tolerance, Decreased safety awareness, Decreased strength  Visit Diagnosis: Muscle weakness (generalized)  Other abnormalities of gait and mobility  Unsteadiness on feet  Other lack of coordination  Difficulty in walking, not elsewhere classified     Problem List Patient Active Problem List   Diagnosis Date Noted   Lumbar disc disorder with myelopathy 06/01/2021   Unsteady gait 05/27/2021   Fall at home, initial encounter 05/27/2021   Lower extremity weakness 05/26/2021   Obesity (BMI 35.0-39.9 without comorbidity) 05/26/2021   Anxiety 09/12/2017   Bipolar disorder ([place]) 09/12/2017   Insulin dependent diabetes mellitus 09/12/2017   Primary insomnia 09/12/2017   Pure hypercholesterolemia 09/12/2017   Recurrent major depressive disorder, in partial remission ([place]) 09/12/2017   Tobacco dependence 09/12/2017   Vitamin D deficiency 09/12/2017   DM type 2 with diabetic peripheral neuropathy ([place]) 04/06/2016   Hypercalcemia 09/13/2015   Hypogonadism in male 09/13/2015   Non morbid obesity due to  excess calories 09/13/2015    Jerry Wheeler, PT, DPT 08/14/2021, 10:30 AM  [facility] [address] [place], [place], [address] Phone: [phone]   Fax:  [phone]  Name: Jerry Wheeler MRN: [id number] Date of Birth: 08-23-1975

## 2021-08-16 ENCOUNTER — Ambulatory Visit: Payer: Medicare Other

## 2021-08-16 ENCOUNTER — Ambulatory Visit: Payer: Medicare Other | Admitting: Physical Therapy

## 2021-08-21 ENCOUNTER — Other Ambulatory Visit: Payer: Self-pay

## 2021-08-21 ENCOUNTER — Encounter: Payer: Self-pay | Admitting: Physical Therapy

## 2021-08-21 ENCOUNTER — Ambulatory Visit: Payer: Medicare Other | Attending: Physical Medicine and Rehabilitation

## 2021-08-21 ENCOUNTER — Ambulatory Visit: Payer: Medicare Other | Admitting: Physical Therapy

## 2021-08-21 DIAGNOSIS — M6281 Muscle weakness (generalized): Secondary | ICD-10-CM | POA: Diagnosis not present

## 2021-08-21 DIAGNOSIS — R262 Difficulty in walking, not elsewhere classified: Secondary | ICD-10-CM | POA: Insufficient documentation

## 2021-08-21 DIAGNOSIS — M5106 Intervertebral disc disorders with myelopathy, lumbar region: Secondary | ICD-10-CM | POA: Insufficient documentation

## 2021-08-21 DIAGNOSIS — R2689 Other abnormalities of gait and mobility: Secondary | ICD-10-CM | POA: Insufficient documentation

## 2021-08-21 DIAGNOSIS — R2681 Unsteadiness on feet: Secondary | ICD-10-CM | POA: Diagnosis present

## 2021-08-21 DIAGNOSIS — R278 Other lack of coordination: Secondary | ICD-10-CM | POA: Insufficient documentation

## 2021-08-21 NOTE — Therapy (Signed)
[place] [facility] SERVICES [address] [place], [place], [address] Phone: [phone]   Fax:  [phone]  Physical Therapy Treatment/ Physical Therapy Progress Note   Dates of reporting period  07/10/21   to   08/21/21   Patient Details  Name: Jerry Wheeler MRN: [id number] Date of Birth: Apr 26, 1976 Referring Provider (PT): Reesa Chew PA/ Netty Starring PCP   Encounter Date: 08/21/2021   PT End of Session - 08/21/21 0914     Visit Number 10    Number of Visits 17    Date for PT Re-Evaluation 09/07/21    Authorization Type medicare    PT Start Time 0930    PT Stop Time 1013    PT Time Calculation (min) 43 min    Equipment Utilized During Treatment Gait belt    Activity Tolerance Patient limited by fatigue    Behavior During Therapy WFL for tasks assessed/performed             Past Medical History:  Diagnosis Date   Anxiety disorder    Bipolar disorder ([place])    Diabetes mellitus ([place])    Diabetic peripheral neuropathy associated with type 2 diabetes mellitus ([place])    Emphysema lung ([place])    hospitalization 1999   High blood pressure    Major depressive disorder in partial remission ([place])    Morbid obesity ([place])    Osteomyelitis of great toe of left foot ([place])    Sleep apnea    Tobacco use disorder, continuous    Ulcer of right heel ([place])    followed by podiatry--fully healed 09/2020    Past Surgical History:  Procedure Laterality Date   DG GREAT TOE LEFT FOOT  2010   s/p I and D for osteo    There were no vitals filed for this visit.   Subjective Assessment - 08/21/21 0913     Subjective Pt reports he was able to access his home via the back yard for the first time this weekend. He reports there is one step to enter the back door and there were some hazards ( roots, tree nuts, etc) but he was able to manage.    Pertinent History 46 yo Male admitted to the hospital 05/26/2021 - 06/01/2021 at [facility] with lower  extremity weakness secondary to peripheral neuropathy and severe spinal stenosis seen on MRI on 05/26/2021. He was discharged to [facility] rehab 06/01/2021 - 06/09/2021. He was discharged to his brother's home. Prior to weakness he was in a 2 story apartment with stairs to enter. Therefore he is currently living at his brothers home which is 2 story but living is on the main floor; he does have 4 steps to enter home with B railings; He reports navigating those steps well. He is currenlty ambulating with RW, able to take a few steps without walker. Reports falling on 12/9 prior to admittance but otherwise no new falls. He denies any significant numbness/tingling; He does have neuropathy in the feet from diabetes but he doesn't feel that it has worsened. He does have an appointment with Dr. Lacinda Axon (neurosurgeon); He is not sure that they will recommend surgery due to uncontrolled diabetes and concern that surgery may not improve condition;    How long can you sit comfortably? several hours    How long can you stand comfortably? Unsure- able to stand at least 5 min but hasn't tested it.    How long can you walk comfortably? about 500 feet with  fatigue, does use RW    Diagnostic tests MRI on 05/26/21 IMPRESSION:  1. Multilevel lumbar spondylosis, as detailed above.  2. Severe canal stenosis at L3-L4.  3. Moderate canal stenosis at L2-L3.  4. Mild-to-moderate canal stenosis with moderate-severe left  foraminal stenosis at L4-L5.    Patient Stated Goals "get back into my apartment- negotiate steps, walk without walker"    Currently in Pain? No/denies    Pain Onset --            Nustep level 0 (seat 16) x 2.5 min, level 1 x 2.5 min, CGA for mount/dismount. Pt monitored throughout for response to intervention.       In // bars, gait belt donned and CGA provided unless otherwise specified: 3# AW donned bilaterally  Foot taps to 6 inch step plus theraband pad for external cue for foot alignment x 10  -x 10  lateral step ups  -B UE support for each   hip extension 2x12 reps    Sitting: Heel raises 3# AW 10 x 5 sec hold  3# donned each LE LAQ 2 x 5 with 5 sec hold alternating LE, rates med-hard due to hold times   2 x 15 reps 1/2 FR under feet alternating between DF and PF, unable to obtain full ROM but able to activate necessary musculature through partial ROM.   Leg Press: (Plate extended for better positioning)  BLE 85# 1x15, reps; Required min A for positioning including to improve foot placement;  No lightheadedness reported with this activity this date.  Pt required occasional rest breaks due fatigue, PT was quick to ask when pt appeared to be fatiguing in order to prevent excessive fatigue.  Pt educated throughout session about proper posture and technique with exercises. Improved exercise technique, movement at target joints, use of target muscles after min to mod verbal, visual, tactile cues.  Note: Portions of this document were prepared using Dragon voice recognition software and although reviewed may contain unintentional dictation errors in syntax, grammar, or spelling.                            PT Education - 08/21/21 0957     Education Details instructions in options for stair navigation with walker.    Person(s) Educated Patient    Methods Explanation;Demonstration    Comprehension Verbalized understanding;Verbal cues required              PT Short Term Goals - 08/14/21 3825       PT SHORT TERM GOAL #1   Title Patient will be adherent to HEP at least 3x a week to improve functional strength and balance for better safety at home.    Baseline 2/27: reports doing them 3-4x a week;    Time 4    Period Weeks    Status Achieved    Target Date 08/10/21      PT SHORT TERM GOAL #2   Title Patient will demonstrate improvement in foot/ankle strength by being able to complete BLE heel raise with minimal UE support for better gait safety and  mobility;    Baseline 2/27: unable    Time 4    Period Weeks    Status Not Met    Target Date 08/10/21               PT Long Term Goals - 08/14/21 0822       PT LONG TERM GOAL #1  Title Patient will increase BLE gross strength to 4+/5  especially in hip as to improve functional strength for independent gait, increased standing tolerance and increased ADL ability.    Baseline 2/27: see flowsheet    Time 8    Period Weeks    Status Partially Met    Target Date 09/07/21      PT LONG TERM GOAL #2   Title Patient will increase six minute walk test distance to >800 for progression toward community ambulator and improve gait ability    Baseline 2/27: 850 feet with RW    Time 8    Period Weeks    Status Achieved    Target Date 09/07/21      PT LONG TERM GOAL #3   Title Patient will demonstrate an improved Berg Balance Score of > 42/56 as to demonstrate improved balance with ADLs such as sitting/standing and transfer balance and reduced fall risk.    Baseline 2/27: 25/56    Time 8    Period Weeks    Status Partially Met    Target Date 09/07/21      PT LONG TERM GOAL #4   Title Patient will reduce timed up and go to <14 seconds to reduce fall risk and demonstrate improved transfer/gait ability.    Baseline 2/27: 9.48 sec with RW;    Time 8    Period Weeks    Status Achieved    Target Date 09/07/21                   Plan - 08/21/21 0915     Clinical Impression Statement Patient highly motivated and participated well within physical therapy session this date.  Progressed with several lower extremity exercises to improve lower extremity strength and endurance and also added many exercises to improve patient's function with stair navigation and safety.  Patient continues to demonstrate fatigue with prolonged exercise but this is fatigue is improving as evidenced by patient's improved ability to perform exercise on NuStep this session.  Patient continues to make fair  progress at this time and will continue to benefit from skilled PT in order to improve strength, balance, mobility, and decrease his risk for falls.  Patient is due for progress note this session, however, all goals were assessed and updated previous session during previous session patient demonstrated similar strength in his lower extremities but did demonstrate improved mobility in his 6-minute walk test, decrease fall risk with his tug although still requiring a rolling walker and improved Berg balance score although his score still puts him at high risk for falls. Patient's condition has the potential to improve in response to therapy. Maximum improvement is yet to be obtained. The anticipated improvement is attainable and reasonable in a generally predictable time.     Personal Factors and Comorbidities Comorbidity 3+;Fitness;Time since onset of injury/illness/exacerbation    Comorbidities PMH Significant: Obesity, Anxiety/depression, bipolar disorder, Type 2 diabetes with neuropathy, insomnia, HTN, Lumbar stenosis    Examination-Activity Limitations Caring for Others;Carry;Locomotion Level;Squat;Stairs;Stand;Transfers    Examination-Participation Restrictions Cleaning;Community Activity;Driving;Occupation;Shop;Volunteer;Yard Work    Stability/Clinical Decision Making Stable/Uncomplicated    Rehab Potential Fair    PT Frequency 2x / week    PT Duration 8 weeks    PT Treatment/Interventions ADLs/Self Care Home Management;Cryotherapy;Moist Heat;Gait training;Stair training;Functional mobility training;Therapeutic activities;Therapeutic exercise;Balance training;Neuromuscular re-education;Patient/family education;Orthotic Fit/Training;Manual techniques    PT Next Visit Plan Continue with strength/balance    PT Home Exercise Plan 07/26/2021=Access Code: [email or web address]; no updates  Consulted and Agree with Plan of Care Patient             Patient will benefit from skilled therapeutic intervention in  order to improve the following deficits and impairments:  Abnormal gait, Decreased balance, Decreased endurance, Decreased mobility, Difficulty walking, Impaired sensation, Obesity, Increased edema, Decreased activity tolerance, Decreased safety awareness, Decreased strength  Visit Diagnosis: Muscle weakness (generalized)  Other abnormalities of gait and mobility  Unsteadiness on feet  Difficulty in walking, not elsewhere classified     Problem List Patient Active Problem List   Diagnosis Date Noted   Lumbar disc disorder with myelopathy 06/01/2021   Unsteady gait 05/27/2021   Fall at home, initial encounter 05/27/2021   Lower extremity weakness 05/26/2021   Obesity (BMI 35.0-39.9 without comorbidity) 05/26/2021   Anxiety 09/12/2017   Bipolar disorder ([place]) 09/12/2017   Insulin dependent diabetes mellitus 09/12/2017   Primary insomnia 09/12/2017   Pure hypercholesterolemia 09/12/2017   Recurrent major depressive disorder, in partial remission ([place]) 09/12/2017   Tobacco dependence 09/12/2017   Vitamin D deficiency 09/12/2017   DM type 2 with diabetic peripheral neuropathy ([place]) 04/06/2016   Hypercalcemia 09/13/2015   Hypogonadism in male 09/13/2015   Non morbid obesity due to excess calories 09/13/2015    Particia Lather, PT 08/21/2021, 11:18 AM  [place], [place], [address] Phone: [phone]   Fax:  [phone]  Name: Dayna Geurts MRN: [id number] Date of Birth: 07/27/1975

## 2021-08-21 NOTE — Therapy (Signed)
[place] MA[place] [facility] SERVICES [address] [place], [place], [address] Phone: [phone]   Fax:  [phone]  Occupational Therapy Progress Note/Recertification Progress reporting period 07/13/21-08/21/21  Patient Details  Name: Jerry Wheeler MRN: [id number] Date of Birth: [birth year] Referring Provider (OT): Reesa Chew, [place]   Encounter Date: 08/21/2021   OT End of Session - 08/21/21 0950     Visit Number 10    Number of Visits 34    Date for OT Re-Evaluation 11/12/21    Authorization Time Period reporting period beginning 08/21/21    OT Start Time 0850    OT Stop Time 0930    OT Time Calculation (min) 40 min    Equipment Utilized During Treatment RW    Activity Tolerance Patient tolerated treatment well    Behavior During Therapy [facility] for tasks assessed/performed             Past Medical History:  Diagnosis Date   Anxiety disorder    Bipolar disorder ([place])    Diabetes mellitus ([place])    Diabetic peripheral neuropathy associated with type 2 diabetes mellitus ([place])    Emphysema lung ([place])    hospitalization 1999   High blood pressure    Major depressive disorder in partial remission ([place])    Morbid obesity ([place])    Osteomyelitis of great toe of left foot ([place])    Sleep apnea    Tobacco use disorder, continuous    Ulcer of right heel ([place])    followed by podiatry--fully healed 09/2020    Past Surgical History:  Procedure Laterality Date   DG GREAT TOE LEFT FOOT  2010   s/p I and D for osteo    There were no vitals filed for this visit.   Subjective Assessment - 08/21/21 0944     Subjective  "I moved back into my apartment this weekend.  The stairs were touch but I made it."    Patient is accompanied by: Family member    Pertinent History poorly controlled T2DM with peripheral neuropathy, bipolar disorder, OSA who was admitted to [facility] on 05/26/21 with worsening of BLE weakness and  numbness for 3 weeks progressing to  inability to walk x one day. Neurology consulted and MRI spine done revealing severe canal stenosis L3/L4, moderate canal stenosis L2/L3 and mild to moderate canal stenosis with  moderate to severe left foraminal stenosis at L4/L5.    Limitations Muscle weakness, mild BUE tremors, impaired balance and gait, hx of falling, neuropathy in BLEs    Patient Stated Goals "I want to improve my balance so I can safely be in my kitchen to cook."    Currently in Pain? No/denies    Pain Score 0-No pain    Pain Onset Yesterday                [facility] OT Assessment - 08/21/21 0001       Assessment   Referring Provider (OT) Reesa Chew, PA    Onset Date/Surgical Date 05/26/21    Hand Dominance Right      Activity Tolerance   Activity Tolerance Comments standing tolerance ~1 min without UE support; 2-3 min with unilateral support on RW      Observation/Other Assessments   Focus on Therapeutic Outcomes (FOTO)  48.4    Standing Functional Reach Test 12" without AD, 14" with 1 hand on RW for support    Outcome Measures 360 degree turn test: 4 seconds with walker as compared to  7 seconds at eval      Hand Function   Right Hand Grip (lbs) 79    Right Hand Lateral Pinch 25 lbs    Left Hand Grip (lbs) 80    Left Hand Lateral Pinch 26 lbs            Occupational Therapy Treatment/Progress Note/Recert: Self Care: Fall prevention strategies reviewed, including reinforcement of using wide BOS when reaching/turning, making slow positional changes, turning lights on when up at night, wearing non-skid footwear, and positioning self close to items for which pt reaches.  Educated on item transport strategies for transporting laundry up/down stairs of apartment.  Encouraged use of laundry bag instead of basket, using strings of bag to wrap around wrist to free hands up for hand stair rail.  Throw bag to bottom of stairs, throw bag or inch up the stairs during transport.  Encouraged light loads for easier  transport.  Reinforced strategies to manage tremors during self care tasks, including WB to BUEs for better distal control, and allow rest breaks with tasks as tremors typically worsen when fatigued.  Pt verbalized understanding of all education provided.   Neuro re-ed: Participated in static/dynamic standing balance activities with writing and reaching activities at whiteboard.  Pt practiced forward reaching with R/L hands, unilateral support, and cues for wide BOS.  Participated in Center game challenged to stand without UE support while writing on whiteboard.  Pt tolerated ~1 min without UE support, ~2-3 min with unilateral support, no major LOB, but balance checks returning hand to walker when fatigued.   Response to Treatment: See Plan/clinical impression below.    OT Education - 08/21/21 0949     Education Details Fall prevention strategies; item transport strategies for laundry management up/down the stairs    Person(s) Educated Patient    Methods Explanation;Verbal cues    Comprehension Verbalized understanding;Verbal cues required              OT Short Term Goals - 08/21/21 0951       OT SHORT TERM GOAL #1   Title Pt will verbalize and demonstrate 3 fall prevention strategies to reduce fall risk with ADLs/IADLs.    Baseline Eval: not yet initiated; 08/21/21: identifies 3 strategies with min vc    Time 6    Period Weeks    Status Partially Met    Target Date 10/02/21      OT SHORT TERM GOAL #2   Title Pt will verbalize 3 compensatory strategies to manage BUE tremors during ADL completion.    Baseline Eval: education needed; 08/21/21: Able to identify 2 strategies with min vc    Time 6    Period Weeks    Status Partially Met    Target Date 10/02/21               OT Long Term Goals - 08/21/21 0953       OT LONG TERM GOAL #1   Title Pt will increase FOTO score to 60 or better to indicate increased functional performance.    Baseline Eval: FOTO 51; 08/21/21: 48.4     Time 12    Period Weeks    Status On-going    Target Date 11/12/21      OT LONG TERM GOAL #2   Title Pt will increase functional reach to >12" without UE support to decrease fall risk with functional reaching during daily tasks.    Baseline Eval: Functional reach 9" without AD, 13" with 1  hand on support surface; 08/21/21: 12" without AD, 14" with 1 hand on support surface    Time 12    Period Weeks    Status Revised    Target Date 11/12/21      OT LONG TERM GOAL #3   Title Pt will increase 360 degree turn test score to WNL (4 secs or less) without AD to indicate improved dynamic standing/decreased fall risk within small spaces for kitchen mobility.    Baseline Eval: 360 turn test score 7 sec with RW; 08/21/21: 4 sec with RW    Time 12    Period Weeks    Status Revised    Target Date 11/12/21      OT LONG TERM GOAL #4   Title Pt will be modified indep to perform hot meal prep at stove top.    Baseline Eval: Dep for hot meal prep, using microwave only and preparing cold meals d/t limited ability to release hands from walker; 08/21/21: Pt managing cold and hot LIGHT meal prep (microwaveable) with modified indep.    Time 12    Period Weeks    Status On-going    Target Date 11/12/21      OT LONG TERM GOAL #5   Title Pt will manage laundry tasks with modified indep.    Baseline Eval: max A; brother currently managing pt's laundry; 08/21/21: pt has not yet attempted laundry, but educated today on transport strategies to manage laundry up/down stairs of apartment    Time 6    Period Weeks    Status On-going    Target Date 11/12/21      OT LONG TERM GOAL #6   Title Pt will increase bilat grip strength by 20 or more lbs to improve ability to hold and carry ADL supplies.    Baseline Eval: R grip strength 66 lbs, L 68 (~20 below normal range for pt's age and gender); 08/21/21: R grip 79, L grip 80    Time 12    Period Weeks    Status Revised    Target Date 11/12/21              Plan  - 08/21/21 1120     Clinical Impression Statement Pt making steady gains towards OT goals.  Pt was able to move back to his own apartment this weekend after staying with his brother short term d/t inability to manage his apartment stairs.  Pt has a 2 level town home/apartment where bedroom is upstairs and laundry is downstairs.  Pt reports he managed his stairs, but with some difficulty this weekend, and has not yet attempted laundry tasks.  OT reviewed laundry transport strategies this day for future attempts.  Pt has improved grip and pinch strength bilaterally, enabling improved ability to hold and carry ADL supplies.  Dynamic standing balance is steadily improving, with noted 360 degree turn test improvement by 3 seconds, and functional reach improvement by 3" without UE support.  Pt is still cautious and limited with his reach outside BOS in any direction when attempting with UE support, but confident and stability reaching with unilateral support is greatly improved.  Pt's goal continues to be to manage daily tasks without relying on walker.  Pt will continue to benefit from skilled OT to increase dynamic standing balance and functional reaching for self care, working to decrease reliance on RW with daily tasks as able.    OT Occupational Profile and History Problem Focused Assessment - Including review of records  relating to presenting problem    Occupational performance deficits (Please refer to evaluation for details): ADL's;IADL's    Body Structure / Function / Physical Skills ADL;Coordination;Endurance;GMC;UE functional use;Balance;Sensation;Body mechanics;Decreased knowledge of use of DME;IADL;Pain;Dexterity;FMC;Strength;Gait;Mobility    Rehab Potential Good    Clinical Decision Making Limited treatment options, no task modification necessary    Comorbidities Affecting Occupational Performance: May have comorbidities impacting occupational performance    Modification or Assistance to Complete  Evaluation  No modification of tasks or assist necessary to complete eval    OT Frequency 2x / week    OT Duration 12 weeks    OT Treatment/Interventions Self-care/ADL training;Therapeutic exercise;DME and/or AE instruction;Functional Mobility Training;Balance training;Energy conservation;Therapeutic activities;Patient/family education    Consulted and Agree with Plan of Care Patient             Patient will benefit from skilled therapeutic intervention in order to improve the following deficits and impairments:   Body Structure / Function / Physical Skills: ADL, Coordination, Endurance, GMC, UE functional use, Balance, Sensation, Body mechanics, Decreased knowledge of use of DME, IADL, Pain, Dexterity, FMC, Strength, Gait, Mobility       Visit Diagnosis: Muscle weakness (generalized)  Other lack of coordination  Lumbar disc disorder with myelopathy    Problem List Patient Active Problem List   Diagnosis Date Noted   Lumbar disc disorder with myelopathy 06/01/2021   Unsteady gait 05/27/2021   Fall at home, initial encounter 05/27/2021   Lower extremity weakness 05/26/2021   Obesity (BMI 35.0-39.9 without comorbidity) 05/26/2021   Anxiety 09/12/2017   Bipolar disorder ([place]) 09/12/2017   Insulin dependent diabetes mellitus 09/12/2017   Primary insomnia 09/12/2017   Pure hypercholesterolemia 09/12/2017   Recurrent major depressive disorder, in partial remission ([place]) 09/12/2017   Tobacco dependence 09/12/2017   Vitamin D deficiency 09/12/2017   DM type 2 with diabetic peripheral neuropathy ([place]) 04/06/2016   Hypercalcemia 09/13/2015   Hypogonadism in male 09/13/2015   Non morbid obesity due to excess calories 09/13/2015   Leta Speller, MS, OTR/L  Darleene Cleaver, OT 08/21/2021, 11:23 AM  [place] [address] [place], [place], [address] Phone: [phone]   Fax:  [phone]  Name: Jerry Wheeler MRN: [id number] Date of Birth: Nov 17, 1975

## 2021-08-23 ENCOUNTER — Ambulatory Visit: Payer: Medicare Other | Admitting: Physical Therapy

## 2021-08-23 ENCOUNTER — Other Ambulatory Visit: Payer: Self-pay

## 2021-08-23 ENCOUNTER — Ambulatory Visit: Payer: Medicare Other

## 2021-08-23 ENCOUNTER — Encounter: Payer: Self-pay | Admitting: Physical Therapy

## 2021-08-23 DIAGNOSIS — M5106 Intervertebral disc disorders with myelopathy, lumbar region: Secondary | ICD-10-CM

## 2021-08-23 DIAGNOSIS — R262 Difficulty in walking, not elsewhere classified: Secondary | ICD-10-CM

## 2021-08-23 DIAGNOSIS — M6281 Muscle weakness (generalized): Secondary | ICD-10-CM | POA: Diagnosis not present

## 2021-08-23 DIAGNOSIS — R278 Other lack of coordination: Secondary | ICD-10-CM

## 2021-08-23 DIAGNOSIS — R2681 Unsteadiness on feet: Secondary | ICD-10-CM

## 2021-08-23 DIAGNOSIS — R2689 Other abnormalities of gait and mobility: Secondary | ICD-10-CM

## 2021-08-23 NOTE — Therapy (Signed)
[place] MAIN [facility] SERVICES [address] [place], [place], [address] Phone: [phone]   Fax:  [phone]  Physical Therapy Treatment  Patient Details  Name: Jerry Wheeler MRN: [id number] Date of Birth: 11-Mar-1976 Referring Provider (PT): Reesa Chew PA/ Netty Starring PCP   Encounter Date: 08/23/2021   PT End of Session - 08/23/21 0814     Visit Number 11    Number of Visits 17    Date for PT Re-Evaluation 09/07/21    Authorization Type medicare    PT Start Time 0811    PT Stop Time 0845    PT Time Calculation (min) 34 min    Equipment Utilized During Treatment Gait belt    Activity Tolerance Patient limited by fatigue    Behavior During Therapy WFL for tasks assessed/performed             Past Medical History:  Diagnosis Date   Anxiety disorder    Bipolar disorder ([place])    Diabetes mellitus ([place])    Diabetic peripheral neuropathy associated with type 2 diabetes mellitus ([place])    Emphysema lung ([place])    hospitalization 1999   High blood pressure    Major depressive disorder in partial remission ([place])    Morbid obesity ([place])    Osteomyelitis of great toe of left foot ([place])    Sleep apnea    Tobacco use disorder, continuous    Ulcer of right heel ([place])    followed by podiatry--fully healed 09/2020    Past Surgical History:  Procedure Laterality Date   DG GREAT TOE LEFT FOOT  2010   s/p I and D for osteo    There were no vitals filed for this visit.   Subjective Assessment - 08/23/21 0813     Subjective Pt reports he ahd one stumble when descending his stairs this AM due to difficulty with walker management. No other changes since last session    Pertinent History 46 yo Male admitted to the hospital 05/26/2021 - 06/01/2021 at [facility] with lower extremity weakness secondary to peripheral neuropathy and severe spinal stenosis seen on MRI on 05/26/2021. He was discharged to [facility] rehab 06/01/2021 - 06/09/2021. He was  discharged to his brother's home. Prior to weakness he was in a 2 story apartment with stairs to enter. Therefore he is currently living at his brothers home which is 2 story but living is on the main floor; he does have 4 steps to enter home with B railings; He reports navigating those steps well. He is currenlty ambulating with RW, able to take a few steps without walker. Reports falling on 12/9 prior to admittance but otherwise no new falls. He denies any significant numbness/tingling; He does have neuropathy in the feet from diabetes but he doesn't feel that it has worsened. He does have an appointment with Dr. Lacinda Axon (neurosurgeon); He is not sure that they will recommend surgery due to uncontrolled diabetes and concern that surgery may not improve condition;    How long can you sit comfortably? several hours    How long can you stand comfortably? Unsure- able to stand at least 5 min but hasn't tested it.    How long can you walk comfortably? about 500 feet with fatigue, does use RW    Diagnostic tests MRI on 05/26/21 IMPRESSION:  1. Multilevel lumbar spondylosis, as detailed above.  2. Severe canal stenosis at L3-L4.  3. Moderate canal stenosis at L2-L3.  4. Mild-to-moderate canal stenosis with  moderate-severe left  foraminal stenosis at L4-L5.    Patient Stated Goals "get back into my apartment- negotiate steps, walk without walker"    Currently in Pain? Yes    Pain Score 1     Pain Location Leg    Pain Orientation Right;Left    Pain Descriptors / Indicators Sore    Pain Type Acute pain    Pain Onset Yesterday    Aggravating Factors  sitting in his chair ( pressure applied ot LEs)              Nustep level 1 x 5 min , CGA for mount/dismount. Pt monitored throughout for response to intervention.       In // bars, gait belt donned and CGA provided unless otherwise specified:  Foot taps to 6 inch step with various colors of hedgehog on each multiple reps   First set B UE, second step  unilateral UE support (R) to simulate stair navigation at home.   Step downs from 6 inch step to airex pad plus 2 inch step ( 4 in surface total)  -pt weight stayed on 6 inch step as we were focusing on foot placement for stair navigation.   Intermittent switching between 1 LE on floor and 1 on 6 insh step to ipmrove  stair navigation coordincation and balance endurance, 2 x 1 minute with hold times between 1-10 seconds at PT discretion  -1 LOB on first round, imprvoed balance with practive.     Sitting:  Heel raises 3# AW 10 x 5 sec hold  3# donned each LE LAQ 2 x 5 with 5 sec hold alternating LE, rates med-hard due to hold times   2 x 15 reps 1/2 FR under feet alternating between DF and PF, unable to obtain full ROM but able to activate necessary musculature through partial ROM.     Pt required occasional rest breaks due fatigue, PT was quick to ask when pt appeared to be fatiguing in order to prevent excessive fatigue.  Pt educated throughout session about proper posture and technique with exercises. Improved exercise technique, movement at target joints, use of target muscles after min to mod verbal, visual, tactile cues.  Note: Portions of this document were prepared using Dragon voice recognition software and although reviewed may contain unintentional dictation errors in syntax, grammar, or spelling.                             PT Education - 08/23/21 1024     Education Details Stair navigation activities    Person(s) Educated Patient    Methods Explanation    Comprehension Verbalized understanding              PT Short Term Goals - 08/14/21 0821       PT SHORT TERM GOAL #1   Title Patient will be adherent to HEP at least 3x a week to improve functional strength and balance for better safety at home.    Baseline 2/27: reports doing them 3-4x a week;    Time 4    Period Weeks    Status Achieved    Target Date 08/10/21      PT SHORT TERM  GOAL #2   Title Patient will demonstrate improvement in foot/ankle strength by being able to complete BLE heel raise with minimal UE support for better gait safety and mobility;    Baseline 2/27: unable    Time 4  Period Weeks    Status Not Met    Target Date 08/10/21               PT Long Term Goals - 08/14/21 2831       PT LONG TERM GOAL #1   Title Patient will increase BLE gross strength to 4+/5  especially in hip as to improve functional strength for independent gait, increased standing tolerance and increased ADL ability.    Baseline 2/27: see flowsheet    Time 8    Period Weeks    Status Partially Met    Target Date 09/07/21      PT LONG TERM GOAL #2   Title Patient will increase six minute walk test distance to >800 for progression toward community ambulator and improve gait ability    Baseline 2/27: 850 feet with RW    Time 8    Period Weeks    Status Achieved    Target Date 09/07/21      PT LONG TERM GOAL #3   Title Patient will demonstrate an improved Berg Balance Score of > 42/56 as to demonstrate improved balance with ADLs such as sitting/standing and transfer balance and reduced fall risk.    Baseline 2/27: 25/56    Time 8    Period Weeks    Status Partially Met    Target Date 09/07/21      PT LONG TERM GOAL #4   Title Patient will reduce timed up and go to <14 seconds to reduce fall risk and demonstrate improved transfer/gait ability.    Baseline 2/27: 9.48 sec with RW;    Time 8    Period Weeks    Status Achieved    Target Date 09/07/21                   Plan - 08/23/21 0815     Clinical Impression Statement Patient continues to demonstrate excellent motivation for completion of physical therapy program this date.  Patient progressed with several lower EXTR exercises with target to improve stair navigation and safety.  Patient had increased difficulty with stair descending tasks this date.  Patient continues to have weakness and  quickness to fatigue will continue to benefit from skilled physical therapy intervention in order to improve his lower extremity strength, balance, mobility, endurance, and improve his overall quality of life.    Personal Factors and Comorbidities Comorbidity 3+;Fitness;Time since onset of injury/illness/exacerbation    Comorbidities PMH Significant: Obesity, Anxiety/depression, bipolar disorder, Type 2 diabetes with neuropathy, insomnia, HTN, Lumbar stenosis    Examination-Activity Limitations Caring for Others;Carry;Locomotion Level;Squat;Stairs;Stand;Transfers    Examination-Participation Restrictions Cleaning;Community Activity;Driving;Occupation;Shop;Volunteer;Yard Work    Stability/Clinical Decision Making Stable/Uncomplicated    Rehab Potential Fair    PT Frequency 2x / week    PT Duration 8 weeks    PT Treatment/Interventions ADLs/Self Care Home Management;Cryotherapy;Moist Heat;Gait training;Stair training;Functional mobility training;Therapeutic activities;Therapeutic exercise;Balance training;Neuromuscular re-education;Patient/family education;Orthotic Fit/Training;Manual techniques    PT Next Visit Plan Continue with strength/balance    PT Home Exercise Plan 07/26/2021=Access Code: [email or web address]; no updates    Consulted and Agree with Plan of Care Patient             Patient will benefit from skilled therapeutic intervention in order to improve the following deficits and impairments:  Abnormal gait, Decreased balance, Decreased endurance, Decreased mobility, Difficulty walking, Impaired sensation, Obesity, Increased edema, Decreased activity tolerance, Decreased safety awareness, Decreased strength  Visit Diagnosis: Other abnormalities of gait and mobility  Unsteadiness  on feet  Difficulty in walking, not elsewhere classified     Problem List Patient Active Problem List   Diagnosis Date Noted   Lumbar disc disorder with myelopathy 06/01/2021   Unsteady gait 05/27/2021   Fall  at home, initial encounter 05/27/2021   Lower extremity weakness 05/26/2021   Obesity (BMI 35.0-39.9 without comorbidity) 05/26/2021   Anxiety 09/12/2017   Bipolar disorder ([place]) 09/12/2017   Insulin dependent diabetes mellitus 09/12/2017   Primary insomnia 09/12/2017   Pure hypercholesterolemia 09/12/2017   Recurrent major depressive disorder, in partial remission ([place]) 09/12/2017   Tobacco dependence 09/12/2017   Vitamin D deficiency 09/12/2017   DM type 2 with diabetic peripheral neuropathy ([place]) 04/06/2016   Hypercalcemia 09/13/2015   Hypogonadism in male 09/13/2015   Non morbid obesity due to excess calories 09/13/2015    Particia Lather, PT 08/23/2021, 10:31 AM  [place] [address] [place], [place], [address] Phone: [phone]   Fax:  [phone]  Name: Jerry Wheeler MRN: [id number] Date of Birth: 1975-10-19

## 2021-08-23 NOTE — Therapy (Signed)
[place] MAIN [facility] SERVICES [address] [place], [place], [address] Phone: [phone]   Fax:  [phone]  Occupational Therapy Treatment  Patient Details  Name: Jerry Wheeler MRN: [id number] Date of Birth: 1975-11-11 Referring Provider (OT): Reesa Chew, [place]   Encounter Date: 08/23/2021   OT End of Session - 08/23/21 1441     Visit Number 11    Number of Visits 34    Date for OT Re-Evaluation 11/12/21    Authorization Time Period reporting period beginning 08/21/21    OT Start Time 0845    OT Stop Time 0925    OT Time Calculation (min) 40 min    Equipment Utilized During Treatment RW    Activity Tolerance Patient tolerated treatment well    Behavior During Therapy WFL for tasks assessed/performed             Past Medical History:  Diagnosis Date   Anxiety disorder    Bipolar disorder ([place])    Diabetes mellitus ([place])    Diabetic peripheral neuropathy associated with type 2 diabetes mellitus ([place])    Emphysema lung ([place])    hospitalization 1999   High blood pressure    Major depressive disorder in partial remission ([place])    Morbid obesity ([place])    Osteomyelitis of great toe of left foot ([place])    Sleep apnea    Tobacco use disorder, continuous    Ulcer of right heel ([place])    followed by podiatry--fully healed 09/2020    Past Surgical History:  Procedure Laterality Date   DG GREAT TOE LEFT FOOT  2010   s/p I and D for osteo    There were no vitals filed for this visit.   Subjective Assessment - 08/23/21 1437     Subjective  "I almost fell yesterday on the stairs.  I kind of bumped my foot on my walker."    Patient is accompanied by: Family member    Pertinent History poorly controlled T2DM with peripheral neuropathy, bipolar disorder, OSA who was admitted to [facility] on 05/26/21 with worsening of BLE weakness and  numbness for 3 weeks progressing to inability to walk x one day. Neurology consulted and MRI spine done  revealing severe canal stenosis L3/L4, moderate canal stenosis L2/L3 and mild to moderate canal stenosis with  moderate to severe left foraminal stenosis at L4/L5.    Limitations Muscle weakness, mild BUE tremors, impaired balance and gait, hx of falling, neuropathy in BLEs    Patient Stated Goals "I want to improve my balance so I can safely be in my kitchen to cook."    Currently in Pain? Yes    Pain Score 1     Pain Location Leg    Pain Orientation Right;Left    Pain Descriptors / Indicators Sore    Pain Type Acute pain    Pain Onset Yesterday    Pain Frequency Intermittent    Aggravating Factors  sitting in his chair (pressure applied to LEs)    Pain Relieving Factors movement/stretch    Effect of Pain on Daily Activities decreased activity tolerance    Multiple Pain Sites No            Occupational Therapy Treatment: Therapeutic Exercise: Facilitated hand strengthening with use of hand gripper set at 28.9# to remove jumbo pegs from pegboard x3 trials each hand, working to increase hand strength for ease of holding and carrying ADL supplies.  Rest breaks needed between sets.  Participated in dynamic standing balance without UE support while writing on whiteboard; pt requiring frequent unilateral support returning hand to walker for balance checks, but this was completed at end of session when pt was fatigued after PT session and addressing stair safety with OT.  Self Care: Educated on and practiced walker safety while negotiating a flight of stairs.  Reinforced safe walker placement at top and bottom of stairs, positioning walker to the side to avoid tripping.  Pt performed x3 reps up/down 1/2 flight of stairs with good return demo and min vc for safety.  Pt is planning to obtain a second walker so that he does not have to carry his walker up/down the stairs but rather keep one on each floor.  OT provided options to purchase as a second walker will likely be paid out of pocket as pt's  current walker is new and was billed through insurance.  Pt aware and appreciative of options and plans to call some local medical supply stores.   Response to Treatment: See Plan/clinical impression below.      OT Education - 08/23/21 1440     Education Details walker safety with stair negotiation    Person(s) Educated Patient    Methods Explanation;Verbal cues    Comprehension Verbalized understanding;Verbal cues required;Returned demonstration              OT Short Term Goals - 08/21/21 0951       OT SHORT TERM GOAL #1   Title Pt will verbalize and demonstrate 3 fall prevention strategies to reduce fall risk with ADLs/IADLs.    Baseline Eval: not yet initiated; 08/21/21: identifies 3 strategies with min vc    Time 6    Period Weeks    Status Partially Met    Target Date 10/02/21      OT SHORT TERM GOAL #2   Title Pt will verbalize 3 compensatory strategies to manage BUE tremors during ADL completion.    Baseline Eval: education needed; 08/21/21: Able to identify 2 strategies with min vc    Time 6    Period Weeks    Status Partially Met    Target Date 10/02/21               OT Long Term Goals - 08/21/21 0953       OT LONG TERM GOAL #1   Title Pt will increase FOTO score to 60 or better to indicate increased functional performance.    Baseline Eval: FOTO 51; 08/21/21: 48.4    Time 12    Period Weeks    Status On-going    Target Date 11/12/21      OT LONG TERM GOAL #2   Title Pt will increase functional reach to >12" without UE support to decrease fall risk with functional reaching during daily tasks.    Baseline Eval: Functional reach 9" without AD, 13" with 1 hand on support surface; 08/21/21: 12" without AD, 14" with 1 hand on support surface    Time 12    Period Weeks    Status Revised    Target Date 11/12/21      OT LONG TERM GOAL #3   Title Pt will increase 360 degree turn test score to WNL (4 secs or less) without AD to indicate improved dynamic  standing/decreased fall risk within small spaces for kitchen mobility.    Baseline Eval: 360 turn test score 7 sec with RW; 08/21/21: 4 sec with RW    Time 12  Period Weeks    Status Revised    Target Date 11/12/21      OT LONG TERM GOAL #4   Title Pt will be modified indep to perform hot meal prep at stove top.    Baseline Eval: Dep for hot meal prep, using microwave only and preparing cold meals d/t limited ability to release hands from walker; 08/21/21: Pt managing cold and hot LIGHT meal prep (microwaveable) with modified indep.    Time 12    Period Weeks    Status On-going    Target Date 11/12/21      OT LONG TERM GOAL #5   Title Pt will manage laundry tasks with modified indep.    Baseline Eval: max A; brother currently managing pt's laundry; 08/21/21: pt has not yet attempted laundry, but educated today on transport strategies to manage laundry up/down stairs of apartment    Time 6    Period Weeks    Status On-going    Target Date 11/12/21      OT LONG TERM GOAL #6   Title Pt will increase bilat grip strength by 20 or more lbs to improve ability to hold and carry ADL supplies.    Baseline Eval: R grip strength 66 lbs, L 68 (~20 below normal range for pt's age and gender); 08/21/21: R grip 79, L grip 80    Time 12    Period Weeks    Status Revised    Target Date 11/12/21             Plan - 08/22/21 1510     Clinical Impression Statement Pt reports a near fall on his stairs yesterday when his foot kicked his walker.  OT reviewed safety with walker at top/bottom of stairs and during stair negotiation.  Pt able to return demo with min vc over 3 trials.  Pt plans to obtain a second walker so that he no longer will have to transport walker on his stairs.  Pt will continue to benefit from skilled OT to increase dynamic standing balance and functional reaching for self care and to reinforce fall prevention strategies with self care and functional mobility.    OT Occupational Profile  and History Problem Focused Assessment - Including review of records relating to presenting problem    Occupational performance deficits (Please refer to evaluation for details): ADL's;IADL's    Body Structure / Function / Physical Skills ADL;Coordination;Endurance;GMC;UE functional use;Balance;Sensation;Body mechanics;Decreased knowledge of use of DME;IADL;Pain;Dexterity;FMC;Strength;Gait;Mobility    Rehab Potential Good    Clinical Decision Making Limited treatment options, no task modification necessary    Comorbidities Affecting Occupational Performance: May have comorbidities impacting occupational performance    Modification or Assistance to Complete Evaluation  No modification of tasks or assist necessary to complete eval    OT Frequency 2x / week    OT Duration 12 weeks    OT Treatment/Interventions Self-care/ADL training;Therapeutic exercise;DME and/or AE instruction;Functional Mobility Training;Balance training;Energy conservation;Therapeutic activities;Patient/family education    Consulted and Agree with Plan of Care Patient             Patient will benefit from skilled therapeutic intervention in order to improve the following deficits and impairments:   Body Structure / Function / Physical Skills: ADL, Coordination, Endurance, GMC, UE functional use, Balance, Sensation, Body mechanics, Decreased knowledge of use of DME, IADL, Pain, Dexterity, FMC, Strength, Gait, Mobility       Visit Diagnosis: Muscle weakness (generalized)  Other lack of coordination  Lumbar disc disorder  with myelopathy    Problem List Patient Active Problem List   Diagnosis Date Noted   Lumbar disc disorder with myelopathy 06/01/2021   Unsteady gait 05/27/2021   Fall at home, initial encounter 05/27/2021   Lower extremity weakness 05/26/2021   Obesity (BMI 35.0-39.9 without comorbidity) 05/26/2021   Anxiety 09/12/2017   Bipolar disorder ([place]) 09/12/2017   Insulin dependent diabetes mellitus  09/12/2017   Primary insomnia 09/12/2017   Pure hypercholesterolemia 09/12/2017   Recurrent major depressive disorder, in partial remission ([place]) 09/12/2017   Tobacco dependence 09/12/2017   Vitamin D deficiency 09/12/2017   DM type 2 with diabetic peripheral neuropathy ([place]) 04/06/2016   Hypercalcemia 09/13/2015   Hypogonadism in male 09/13/2015   Non morbid obesity due to excess calories 09/13/2015   Leta Speller, MS, OTR/L  Darleene Cleaver, OT 08/23/2021, 3:10 PM  [place] MAIN [facility] SERVICES [address] [place], [place], [address] Phone: [phone]   Fax:  [phone]  Name: Jerry Wheeler MRN: [id number] Date of Birth: 1975/07/10

## 2021-08-28 ENCOUNTER — Ambulatory Visit: Payer: Medicare Other

## 2021-08-28 ENCOUNTER — Ambulatory Visit: Payer: Medicare Other | Admitting: Physical Therapy

## 2021-08-28 ENCOUNTER — Other Ambulatory Visit: Payer: Self-pay

## 2021-08-28 ENCOUNTER — Encounter: Payer: Self-pay | Admitting: Physical Therapy

## 2021-08-28 DIAGNOSIS — R262 Difficulty in walking, not elsewhere classified: Secondary | ICD-10-CM

## 2021-08-28 DIAGNOSIS — R278 Other lack of coordination: Secondary | ICD-10-CM

## 2021-08-28 DIAGNOSIS — M5106 Intervertebral disc disorders with myelopathy, lumbar region: Secondary | ICD-10-CM

## 2021-08-28 DIAGNOSIS — R2689 Other abnormalities of gait and mobility: Secondary | ICD-10-CM

## 2021-08-28 DIAGNOSIS — M6281 Muscle weakness (generalized): Secondary | ICD-10-CM | POA: Diagnosis not present

## 2021-08-28 DIAGNOSIS — R2681 Unsteadiness on feet: Secondary | ICD-10-CM

## 2021-08-28 NOTE — Therapy (Signed)
[facility] MAIN [facility] SERVICES [address] [place], [place], [address] Phone: [phone]   Fax:  [phone]  Occupational Therapy Treatment  Patient Details  Name: Jerry Wheeler MRN: [id number] Date of Birth: [birth year] Referring Provider (OT): Delle Reining, [place]   Encounter Date: 08/28/2021   OT End of Session - 08/28/21 0910     Visit Number 12    Number of Visits 34    Date for OT Re-Evaluation 11/12/21    Authorization Time Period reporting period beginning 08/21/21    OT Start Time 0845    OT Stop Time 0930    OT Time Calculation (min) 45 min    Equipment Utilized During Treatment RW    Activity Tolerance Patient tolerated treatment well    Behavior During Therapy WFL for tasks assessed/performed             Past Medical History:  Diagnosis Date   Anxiety disorder    Bipolar disorder (HCC)    Diabetes mellitus (HCC)    Diabetic peripheral neuropathy associated with type 2 diabetes mellitus (HCC)    Emphysema lung (HCC)    hospitalization 1999   High blood pressure    Major depressive disorder in partial remission (HCC)    Morbid obesity (HCC)    Osteomyelitis of great toe of left foot (HCC)    Sleep apnea    Tobacco use disorder, continuous    Ulcer of right heel (HCC)    followed by podiatry--fully healed 09/2020    Past Surgical History:  Procedure Laterality Date   DG GREAT TOE LEFT FOOT  2010   s/p I and D for osteo    There were no vitals filed for this visit.   Subjective Assessment - 08/28/21 0904     Subjective  "I realized I got up in the middle of the night to use the bathroom without my walker, and didn't even think about using it, like I was on autopilot.  I did ok though."    Patient is accompanied by: Family member    Pertinent History poorly controlled T2DM with peripheral neuropathy, bipolar disorder, OSA who was admitted to [facility] on 05/26/21 with worsening of BLE weakness and  numbness for 3  weeks progressing to inability to walk x one day. Neurology consulted and MRI spine done revealing severe canal stenosis L3/L4, moderate canal stenosis L2/L3 and mild to moderate canal stenosis with  moderate to severe left foraminal stenosis at L4/L5.    Limitations Muscle weakness, mild BUE tremors, impaired balance and gait, hx of falling, neuropathy in BLEs    Patient Stated Goals "I want to improve my balance so I can safely be in my kitchen to cook."    Currently in Pain? No/denies    Pain Score 0-No pain    Pain Onset Yesterday            Occupational Therapy Treatment: Neuro re-ed: Pt participated in functional reaching laterally R/L and forward with return to upright posture following each reach.  Wide BOS for all, requiring 1-2 balance checks with hand to support surface with each direction within a reaching set.  Reaching sets included placing 25 washers over vertical dowels or sliding washers across table top alternating R/L with trunk rotation.  Participated in overhead reach to lift clock from wall for clock reset; min guard with 1 hand on wall for steadying.  Response to Treatment: Feet shoulder width apart or wider BOS with 1-2 balance checks  for each reaching set completed today.  Challenged overhead reach, forward reach, and lateral reach with and without trunk rotation.  Close SBA for 1st half of session, intermittent min guard and more frequent sitting breaks by last half of session when pt became more fatigued and reported feeling like R knee could buckle.  Remedied with more frequent rest breaks.  Pt will continue to benefit from skilled OT for increasing dynamic standing balance and functional reaching to decrease fall risk during daily tasks, and working toward progression of LRD during daily tasks.      OT Education - 08/28/21 0910     Education Details standing posture    Person(s) Educated Patient    Methods Explanation;Verbal cues    Comprehension Verbalized  understanding;Verbal cues required;Returned demonstration              OT Short Term Goals - 08/21/21 0951       OT SHORT TERM GOAL #1   Title Pt will verbalize and demonstrate 3 fall prevention strategies to reduce fall risk with ADLs/IADLs.    Baseline Eval: not yet initiated; 08/21/21: identifies 3 strategies with min vc    Time 6    Period Weeks    Status Partially Met    Target Date 10/02/21      OT SHORT TERM GOAL #2   Title Pt will verbalize 3 compensatory strategies to manage BUE tremors during ADL completion.    Baseline Eval: education needed; 08/21/21: Able to identify 2 strategies with min vc    Time 6    Period Weeks    Status Partially Met    Target Date 10/02/21               OT Long Term Goals - 08/21/21 0953       OT LONG TERM GOAL #1   Title Pt will increase FOTO score to 60 or better to indicate increased functional performance.    Baseline Eval: FOTO 51; 08/21/21: 48.4    Time 12    Period Weeks    Status On-going    Target Date 11/12/21      OT LONG TERM GOAL #2   Title Pt will increase functional reach to >12" without UE support to decrease fall risk with functional reaching during daily tasks.    Baseline Eval: Functional reach 9" without AD, 13" with 1 hand on support surface; 08/21/21: 12" without AD, 14" with 1 hand on support surface    Time 12    Period Weeks    Status Revised    Target Date 11/12/21      OT LONG TERM GOAL #3   Title Pt will increase 360 degree turn test score to WNL (4 secs or less) without AD to indicate improved dynamic standing/decreased fall risk within small spaces for kitchen mobility.    Baseline Eval: 360 turn test score 7 sec with RW; 08/21/21: 4 sec with RW    Time 12    Period Weeks    Status Revised    Target Date 11/12/21      OT LONG TERM GOAL #4   Title Pt will be modified indep to perform hot meal prep at stove top.    Baseline Eval: Dep for hot meal prep, using microwave only and preparing cold meals  d/t limited ability to release hands from walker; 08/21/21: Pt managing cold and hot LIGHT meal prep (microwaveable) with modified indep.    Time 12    Period  Weeks    Status On-going    Target Date 11/12/21      OT LONG TERM GOAL #5   Title Pt will manage laundry tasks with modified indep.    Baseline Eval: max A; brother currently managing pt's laundry; 08/21/21: pt has not yet attempted laundry, but educated today on transport strategies to manage laundry up/down stairs of apartment    Time 6    Period Weeks    Status On-going    Target Date 11/12/21      OT LONG TERM GOAL #6   Title Pt will increase bilat grip strength by 20 or more lbs to improve ability to hold and carry ADL supplies.    Baseline Eval: R grip strength 66 lbs, L 68 (~20 below normal range for pt's age and gender); 08/21/21: R grip 79, L grip 80    Time 12    Period Weeks    Status Revised    Target Date 11/12/21              Plan - 08/28/21 0950     Clinical Impression Statement Feet shoulder width apart or wider BOS with 1-2 balance checks for each reaching set completed today.  Challenged overhead reach, forward reach, and lateral reach with and without trunk rotation.  Close SBA for 1st half of session, intermittent min guard and more frequent sitting breaks by last half of session when pt became more fatigued and reported feeling like R knee could buckle.  Remedied with more frequent rest breaks.  Pt will continue to benefit from skilled OT for increasing dynamic standing balance and functional reaching to decrease fall risk during daily tasks, and working toward progression of LRD during daily tasks.    OT Occupational Profile and History Problem Focused Assessment - Including review of records relating to presenting problem    Occupational performance deficits (Please refer to evaluation for details): ADL's;IADL's    Body Structure / Function / Physical Skills ADL;Coordination;Endurance;GMC;UE functional  use;Balance;Sensation;Body mechanics;Decreased knowledge of use of DME;IADL;Pain;Dexterity;FMC;Strength;Gait;Mobility    Rehab Potential Good    Clinical Decision Making Limited treatment options, no task modification necessary    Comorbidities Affecting Occupational Performance: May have comorbidities impacting occupational performance    Modification or Assistance to Complete Evaluation  No modification of tasks or assist necessary to complete eval    OT Frequency 2x / week    OT Duration 12 weeks    OT Treatment/Interventions Self-care/ADL training;Therapeutic exercise;DME and/or AE instruction;Functional Mobility Training;Balance training;Energy conservation;Therapeutic activities;Patient/family education    Consulted and Agree with Plan of Care Patient             Patient will benefit from skilled therapeutic intervention in order to improve the following deficits and impairments:   Body Structure / Function / Physical Skills: ADL, Coordination, Endurance, GMC, UE functional use, Balance, Sensation, Body mechanics, Decreased knowledge of use of DME, IADL, Pain, Dexterity, FMC, Strength, Gait, Mobility       Visit Diagnosis: Muscle weakness (generalized)  Other lack of coordination  Lumbar disc disorder with myelopathy    Problem List Patient Active Problem List   Diagnosis Date Noted   Lumbar disc disorder with myelopathy 06/01/2021   Unsteady gait 05/27/2021   Fall at home, initial encounter 05/27/2021   Lower extremity weakness 05/26/2021   Obesity (BMI 35.0-39.9 without comorbidity) 05/26/2021   Anxiety 09/12/2017   Bipolar disorder (HCC) 09/12/2017   Insulin dependent diabetes mellitus 09/12/2017   Primary insomnia 09/12/2017  Pure hypercholesterolemia 09/12/2017   Recurrent major depressive disorder, in partial remission (HCC) 09/12/2017   Tobacco dependence 09/12/2017   Vitamin D deficiency 09/12/2017   DM type 2 with diabetic peripheral neuropathy (HCC)  04/06/2016   Hypercalcemia 09/13/2015   Hypogonadism in male 09/13/2015   Non morbid obesity due to excess calories 09/13/2015   Danelle Earthly, MS, OTR/L  Otis Dials, OT 08/28/2021, 9:50 AM   [facility] MAIN [facility] SERVICES [address] [place], [place], [address] Phone: [phone]   Fax:  [phone]  Name: Jerry Wheeler MRN: [id number] Date of Birth: November 24, 1975

## 2021-08-28 NOTE — Therapy (Signed)
[place] MAIN [facility] SERVICES [address] [place], [place], [address] Phone: [phone]   Fax:  [phone]  Physical Therapy Treatment  Patient Details  Name: Jerry Wheeler MRN: [id number] Date of Birth: 1975-07-29 Referring Provider (PT): Reesa Chew PA/ Netty Starring PCP   Encounter Date: 08/28/2021   PT End of Session - 08/28/21 0946     Visit Number 12    Number of Visits 17    Date for PT Re-Evaluation 09/07/21    Authorization Type medicare    PT Start Time 0822    PT Stop Time 0845    PT Time Calculation (min) 23 min    Equipment Utilized During Treatment Gait belt    Activity Tolerance Patient limited by fatigue    Behavior During Therapy WFL for tasks assessed/performed             Past Medical History:  Diagnosis Date   Anxiety disorder    Bipolar disorder ([place])    Diabetes mellitus ([place])    Diabetic peripheral neuropathy associated with type 2 diabetes mellitus ([place])    Emphysema lung ([place])    hospitalization 1999   High blood pressure    Major depressive disorder in partial remission ([place])    Morbid obesity ([place])    Osteomyelitis of great toe of left foot ([place])    Sleep apnea    Tobacco use disorder, continuous    Ulcer of right heel ([place])    followed by podiatry--fully healed 09/2020    Past Surgical History:  Procedure Laterality Date   DG GREAT TOE LEFT FOOT  2010   s/p I and D for osteo    There were no vitals filed for this visit.   Subjective Assessment - 08/28/21 0823     Subjective Reports no falls, stumbles or LOB since last session. Reports he has purchased a second walker for upstairs use to prevent carrying his surrnet walker up and down the stairs.    Pertinent History 46 yo Male admitted to the hospital 05/26/2021 - 06/01/2021 at [facility] with lower extremity weakness secondary to peripheral neuropathy and severe spinal stenosis seen on MRI on 05/26/2021. He was discharged to [facility] rehab  06/01/2021 - 06/09/2021. He was discharged to his brother's home. Prior to weakness he was in a 2 story apartment with stairs to enter. Therefore he is currently living at his brothers home which is 2 story but living is on the main floor; he does have 4 steps to enter home with B railings; He reports navigating those steps well. He is currenlty ambulating with RW, able to take a few steps without walker. Reports falling on 12/9 prior to admittance but otherwise no new falls. He denies any significant numbness/tingling; He does have neuropathy in the feet from diabetes but he doesn't feel that it has worsened. He does have an appointment with Dr. Lacinda Axon (neurosurgeon); He is not sure that they will recommend surgery due to uncontrolled diabetes and concern that surgery may not improve condition;    How long can you sit comfortably? several hours    How long can you stand comfortably? Unsure- able to stand at least 5 min but hasn't tested it.    How long can you walk comfortably? about 500 feet with fatigue, does use RW    Diagnostic tests MRI on 05/26/21 IMPRESSION:  1. Multilevel lumbar spondylosis, as detailed above.  2. Severe canal stenosis at L3-L4.  3. Moderate canal stenosis at L2-L3.  4. Mild-to-moderate canal stenosis with moderate-severe left  foraminal stenosis at L4-L5.    Patient Stated Goals "get back into my apartment- negotiate steps, walk without walker"    Currently in Pain? No/denies    Pain Onset Yesterday             The following activities were completed in parallel bars or at balance bar  Unless otherwise stated, CGA was provided and gait belt donned in order to ensure pt safety with standing activities   Leg press ( adjusted to accommodate pt height)  1 set 55# x 15 reps  1 set 70# x 12 reps  1 set 80# x 10 reps ( 7 RPE)   Foot taps to 6 and 12 inch step with various colors of hedgehog on each multiple reps x 2 rounds    Intermittent switching between 1 LE on floor and  1 on 6 insh step to ipmrove  stair navigation coordincation and balance endurance, x 1 minute with hold times between 1-10 seconds at PT discretion  R U for A and increased difficulty with L LE foot clearance ( pt tends to lean posterior to improve foot clearance but this improved with increased practice.      Sitting:  Heel raises 3# AW 2* 10 x 5 sec hold  3# donned each LE LAQ  x 10 with 5 sec hold alternating LE, rates med-hard due to hold times     Pt required occasional rest breaks due fatigue, PT was quick to ask when pt appeared to be fatiguing in order to prevent excessive fatigue.  Pt educated throughout session about proper posture and technique with exercises. Improved exercise technique, movement at target joints, use of target muscles after min to mod verbal, visual, tactile cues.  Note: Portions of this document were prepared using Dragon voice recognition software and although reviewed may contain unintentional dictation errors in syntax, grammar, or spelling.    Pt session was cut a little short today due to pt arriving a few minutes late to scheduled appointment time                        PT Education - 08/28/21 0946     Education Details Exercise form and technique    Person(s) Educated Patient    Methods Explanation    Comprehension Verbalized understanding              PT Short Term Goals - 08/14/21 3151       PT SHORT TERM GOAL #1   Title Patient will be adherent to HEP at least 3x a week to improve functional strength and balance for better safety at home.    Baseline 2/27: reports doing them 3-4x a week;    Time 4    Period Weeks    Status Achieved    Target Date 08/10/21      PT SHORT TERM GOAL #2   Title Patient will demonstrate improvement in foot/ankle strength by being able to complete BLE heel raise with minimal UE support for better gait safety and mobility;    Baseline 2/27: unable    Time 4    Period Weeks     Status Not Met    Target Date 08/10/21               PT Long Term Goals - 08/14/21 7616       PT LONG TERM GOAL #1   Title Patient  will increase BLE gross strength to 4+/5  especially in hip as to improve functional strength for independent gait, increased standing tolerance and increased ADL ability.    Baseline 2/27: see flowsheet    Time 8    Period Weeks    Status Partially Met    Target Date 09/07/21      PT LONG TERM GOAL #2   Title Patient will increase six minute walk test distance to >800 for progression toward community ambulator and improve gait ability    Baseline 2/27: 850 feet with RW    Time 8    Period Weeks    Status Achieved    Target Date 09/07/21      PT LONG TERM GOAL #3   Title Patient will demonstrate an improved Berg Balance Score of > 42/56 as to demonstrate improved balance with ADLs such as sitting/standing and transfer balance and reduced fall risk.    Baseline 2/27: 25/56    Time 8    Period Weeks    Status Partially Met    Target Date 09/07/21      PT LONG TERM GOAL #4   Title Patient will reduce timed up and go to <14 seconds to reduce fall risk and demonstrate improved transfer/gait ability.    Baseline 2/27: 9.48 sec with RW;    Time 8    Period Weeks    Status Achieved    Target Date 09/07/21                   Plan - 08/28/21 0947     Clinical Impression Statement Pt demosntrated good motivation for completion of PT exercsies this date. Pt did present to therpay several minutes late secondary to transportation issues and treatment time was shortened as a result. Pt progressed with leg press repetitions and continued with LE strength and stair navigation training.Patient continues to have weakness and quickness to fatigue will continue to benefit from skilled physical therapy intervention in order to improve his lower extremity strength, balance, mobility, endurance, and improve his overall quality of life.    Personal  Factors and Comorbidities Comorbidity 3+;Fitness;Time since onset of injury/illness/exacerbation    Comorbidities PMH Significant: Obesity, Anxiety/depression, bipolar disorder, Type 2 diabetes with neuropathy, insomnia, HTN, Lumbar stenosis    Examination-Activity Limitations Caring for Others;Carry;Locomotion Level;Squat;Stairs;Stand;Transfers    Examination-Participation Restrictions Cleaning;Community Activity;Driving;Occupation;Shop;Volunteer;Yard Work    Stability/Clinical Decision Making Stable/Uncomplicated    Rehab Potential Fair    PT Frequency 2x / week    PT Duration 8 weeks    PT Treatment/Interventions ADLs/Self Care Home Management;Cryotherapy;Moist Heat;Gait training;Stair training;Functional mobility training;Therapeutic activities;Therapeutic exercise;Balance training;Neuromuscular re-education;Patient/family education;Orthotic Fit/Training;Manual techniques    PT Next Visit Plan Continue with strength/balance    PT Home Exercise Plan 07/26/2021=Access Code: [email or web address]; no updates    Consulted and Agree with Plan of Care Patient             Patient will benefit from skilled therapeutic intervention in order to improve the following deficits and impairments:  Abnormal gait, Decreased balance, Decreased endurance, Decreased mobility, Difficulty walking, Impaired sensation, Obesity, Increased edema, Decreased activity tolerance, Decreased safety awareness, Decreased strength  Visit Diagnosis: Muscle weakness (generalized)  Other abnormalities of gait and mobility  Unsteadiness on feet  Difficulty in walking, not elsewhere classified     Problem List Patient Active Problem List   Diagnosis Date Noted   Lumbar disc disorder with myelopathy 06/01/2021   Unsteady gait 05/27/2021   Fall at home, initial  encounter 05/27/2021   Lower extremity weakness 05/26/2021   Obesity (BMI 35.0-39.9 without comorbidity) 05/26/2021   Anxiety 09/12/2017   Bipolar disorder ([place])  09/12/2017   Insulin dependent diabetes mellitus 09/12/2017   Primary insomnia 09/12/2017   Pure hypercholesterolemia 09/12/2017   Recurrent major depressive disorder, in partial remission ([place]) 09/12/2017   Tobacco dependence 09/12/2017   Vitamin D deficiency 09/12/2017   DM type 2 with diabetic peripheral neuropathy ([place]) 04/06/2016   Hypercalcemia 09/13/2015   Hypogonadism in male 09/13/2015   Non morbid obesity due to excess calories 09/13/2015    Particia Lather, PT 08/28/2021, 9:51 AM  [place] [address] [place], [place], [address] Phone: [phone]   Fax:  [phone]  Name: Marquinn Meschke MRN: [id number] Date of Birth: 1976-04-25

## 2021-08-30 ENCOUNTER — Ambulatory Visit: Payer: Medicare Other

## 2021-08-30 ENCOUNTER — Ambulatory Visit: Payer: Medicare Other | Admitting: Physical Therapy

## 2021-09-04 ENCOUNTER — Other Ambulatory Visit: Payer: Self-pay

## 2021-09-04 ENCOUNTER — Ambulatory Visit: Payer: Medicare Other

## 2021-09-04 ENCOUNTER — Encounter: Payer: Self-pay | Admitting: Physical Therapy

## 2021-09-04 ENCOUNTER — Ambulatory Visit: Payer: Medicare Other | Admitting: Physical Therapy

## 2021-09-04 DIAGNOSIS — M5106 Intervertebral disc disorders with myelopathy, lumbar region: Secondary | ICD-10-CM

## 2021-09-04 DIAGNOSIS — R2681 Unsteadiness on feet: Secondary | ICD-10-CM

## 2021-09-04 DIAGNOSIS — R262 Difficulty in walking, not elsewhere classified: Secondary | ICD-10-CM

## 2021-09-04 DIAGNOSIS — R278 Other lack of coordination: Secondary | ICD-10-CM

## 2021-09-04 DIAGNOSIS — M6281 Muscle weakness (generalized): Secondary | ICD-10-CM

## 2021-09-04 DIAGNOSIS — R2689 Other abnormalities of gait and mobility: Secondary | ICD-10-CM

## 2021-09-04 NOTE — Therapy (Signed)
[facility] ?[place], [place], [address] ?Phone: [phone]   Fax:  [phone] ? ?Physical Therapy Treatment ? ?Patient Details  ?Name: Jerry Wheeler ?MRN: [id number] ?Date of Birth: 10-Aug-1975 ?Referring Provider (PT): Reesa Chew PA/ Netty Starring PCP ? ? ?Encounter Date: 09/04/2021 ? ? PT End of Session - 09/04/21 0816   ? ? Visit Number 13   ? Number of Visits 17   ? Date for PT Re-Evaluation 09/07/21   ? Authorization Type medicare   ? PT Start Time [phone]   ? PT Stop Time 0845   ? PT Time Calculation (min) 35 min   ? Activity Tolerance Patient limited by fatigue   ? Behavior During Therapy [facility] for tasks assessed/performed   ? ?  ?  ? ?  ? ? ?Past Medical History:  ?Diagnosis Date  ? Anxiety disorder   ? Bipolar disorder ([place])   ? Diabetes mellitus ([place])   ? Diabetic peripheral neuropathy associated with type 2 diabetes mellitus ([place])   ? Emphysema lung ([place])   ? hospitalization 1999  ? High blood pressure   ? Major depressive disorder in partial remission ([place])   ? Morbid obesity ([place])   ? Osteomyelitis of great toe of left foot ([place])   ? Sleep apnea   ? Tobacco use disorder, continuous   ? Ulcer of right heel ([place])   ? followed by podiatry--fully healed 09/2020  ? ? ?Past Surgical History:  ?Procedure Laterality Date  ? DG GREAT TOE LEFT FOOT  2010  ? s/p I and D for osteo  ? ? ?There were no vitals filed for this visit. ? ? Subjective Assessment - 09/04/21 0813   ? ? Subjective Pt reports doing well. He reports some back soreness over last couple of days but denies any pain currently; Reports the pain was mild and was just sore at end of the day; Reports at times he feels like he may feel a little tingle in his feet, "Its slight". 1 stumble but no falls;   ? Pertinent History 46 yo Male admitted to the hospital 05/26/2021 - 06/01/2021 at [facility] with lower extremity weakness secondary to peripheral neuropathy and severe spinal stenosis seen on  MRI on 05/26/2021. He was discharged to [facility] rehab 06/01/2021 - 06/09/2021. He was discharged to his brother's home. Prior to weakness he was in a 2 story apartment with stairs to enter. Therefore he is currently living at his brothers home which is 2 story but living is on the main floor; he does have 4 steps to enter home with B railings; He reports navigating those steps well. He is currenlty ambulating with RW, able to take a few steps without walker. Reports falling on 12/9 prior to admittance but otherwise no new falls. He denies any significant numbness/tingling; He does have neuropathy in the feet from diabetes but he doesn't feel that it has worsened. He does have an appointment with Dr. Lacinda Axon (neurosurgeon); He is not sure that they will recommend surgery due to uncontrolled diabetes and concern that surgery may not improve condition;   ? How long can you sit comfortably? several hours   ? How long can you stand comfortably? Unsure- able to stand at least 5 min but hasn't tested it.   ? How long can you walk comfortably? about 500 feet with fatigue, does use RW   ? Diagnostic tests MRI on 05/26/21 IMPRESSION:  1. Multilevel lumbar spondylosis, as  detailed above.  2. Severe canal stenosis at L3-L4.  3. Moderate canal stenosis at L2-L3.  4. Mild-to-moderate canal stenosis with moderate-severe left  foraminal stenosis at L4-L5.   ? Patient Stated Goals "get back into my apartment- negotiate steps, walk without walker"   ? Currently in Pain? No/denies   ? Pain Onset Yesterday   ? ?  ?  ? ?  ? ? ? ? ? ? ? ? ?The following activities were completed at balance bar  ?Unless otherwise stated, CGA was provided and gait belt donned in order to ensure pt safety with standing activities  ?  ?Leg press ( adjusted to accommodate pt height)  ?1 set 55# x 15 reps, requires min A for positioning feet and keeping hip oriented to neutral;  ?1 set 70# x 15 reps  ?1 set 80# x 12 reps ( 7 RPE)  ? ?Standing on bottom step, hip  flexion march against green tband 2x15 reps each LE, moderate difficulty;  ? ?Standing with 3# ankle weight: ?Hip abduction SLR x12 reps each LE with cues to avoid hip ER and improve upper trunk control and reduce UE weight bearing  ?Hip extension x10 reps each with better hip contraction noted this session;  ?  ?  ?Forward step ups on 6 inch step with fingertip hold x10 reps each LE, required 30 sec standing break between each LE, RPE 8/10, vitals after step ups, HR 133, Spo2 97%  ?  ?  ?Standing on firm surface with feet apart: holding ball in BUE, bending down and tapping 2nd step (8 inch step) and then standing up x5 reps  ? Required min A for safety with moderate instability and moderate difficulty reported; ? ? ?Sitting: ? ?Heel raises 3# AW 2* 10 x 5 sec hold  ?3# donned each LE LAQ  x 10 with 5 sec hold alternating LE, rates med-hard due to hold times  ?  ?  ?Pt required occasional rest breaks due fatigue, PT was quick to ask when pt appeared to be fatiguing in order to prevent excessive fatigue. ? ?Alternated between seated/standing exercise to reduce fatigue;  ?  ?Pt educated throughout session about proper posture and technique with exercises. Improved exercise technique, movement at target joints, use of target muscles after min to mod verbal, visual, tactile cues. ?  ?Pt session was cut a little short today due to pt arriving a few minutes late to scheduled appointment time ?  ?  ?  ? ? ? ? ? ? ? ? ? ? ? ? ? ? ? ? ? ? ? ? PT Education - 09/04/21 0814   ? ? Education Details exercise form and technique;   ? Person(s) Educated Patient   ? Methods Explanation;Verbal cues   ? Comprehension Verbalized understanding;Returned demonstration;Verbal cues required;Need further instruction   ? ?  ?  ? ?  ? ? ? PT Short Term Goals - 08/14/21 0821   ? ?  ? PT SHORT TERM GOAL #1  ? Title Patient will be adherent to HEP at least 3x a week to improve functional strength and balance for better safety at home.   ?  Baseline 2/27: reports doing them 3-4x a week;   ? Time 4   ? Period Weeks   ? Status Achieved   ? Target Date 08/10/21   ?  ? PT SHORT TERM GOAL #2  ? Title Patient will demonstrate improvement in foot/ankle strength by being able to complete  BLE heel raise with minimal UE support for better gait safety and mobility;   ? Baseline 2/27: unable   ? Time 4   ? Period Weeks   ? Status Not Met   ? Target Date 08/10/21   ? ?  ?  ? ?  ? ? ? ? PT Long Term Goals - 08/14/21 0822   ? ?  ? PT LONG TERM GOAL #1  ? Title Patient will increase BLE gross strength to 4+/5  especially in hip as to improve functional strength for independent gait, increased standing tolerance and increased ADL ability.   ? Baseline 2/27: see flowsheet   ? Time 8   ? Period Weeks   ? Status Partially Met   ? Target Date 09/07/21   ?  ? PT LONG TERM GOAL #2  ? Title Patient will increase six minute walk test distance to >800 for progression toward community ambulator and improve gait ability   ? Baseline 2/27: 850 feet with RW   ? Time 8   ? Period Weeks   ? Status Achieved   ? Target Date 09/07/21   ?  ? PT LONG TERM GOAL #3  ? Title Patient will demonstrate an improved Berg Balance Score of > 42/56 as to demonstrate improved balance with ADLs such as sitting/standing and transfer balance and reduced fall risk.   ? Baseline 2/27: 25/56   ? Time 8   ? Period Weeks   ? Status Partially Met   ? Target Date 09/07/21   ?  ? PT LONG TERM GOAL #4  ? Title Patient will reduce timed up and go to <14 seconds to reduce fall risk and demonstrate improved transfer/gait ability.   ? Baseline 2/27: 9.48 sec with RW;   ? Time 8   ? Period Weeks   ? Status Achieved   ? Target Date 09/07/21   ? ?  ?  ? ?  ? ? ? ? ? ? ? ? Plan - 09/04/21 0816   ? ? Clinical Impression Statement Patient late to session; He was instructed in advanced LE strengthening; Instructed patient to reduce UE hand hold to finger tip hold to challenge LE weight bearing. He does require cues for  proper positioning and exercise technique; He does reports increased fatigue with advanced standing requiring short seated rest break. Patient was instructed in standing bending down unsupported with moderate

## 2021-09-04 NOTE — Therapy (Signed)
[facility] ?[place], [place], [address] ?Phone: [phone]   Fax:  [phone] ? ?Occupational Therapy Treatment ? ?Patient Details  ?Name: Jerry Wheeler ?MRN: [id number] ?Date of Birth: 18-Dec-1975 ?Referring Provider (OT): Reesa Chew, [place] ? ? ?Encounter Date: 09/04/2021 ? ? OT End of Session - 09/04/21 1245   ? ? Visit Number 13   ? Number of Visits 34   ? Date for OT Re-Evaluation 11/12/21   ? Authorization Time Period reporting period beginning 08/21/21   ? OT Start Time 0845   ? OT Stop Time [phone]   ? OT Time Calculation (min) 43 min   ? Equipment Utilized During Treatment RW   ? Activity Tolerance Patient tolerated treatment well   ? Behavior During Therapy [facility] for tasks assessed/performed   ? ?  ?  ? ?  ? ? ?Past Medical History:  ?Diagnosis Date  ? Anxiety disorder   ? Bipolar disorder ([place])   ? Diabetes mellitus ([place])   ? Diabetic peripheral neuropathy associated with type 2 diabetes mellitus ([place])   ? Emphysema lung ([place])   ? hospitalization 1999  ? High blood pressure   ? Major depressive disorder in partial remission ([place])   ? Morbid obesity ([place])   ? Osteomyelitis of great toe of left foot ([place])   ? Sleep apnea   ? Tobacco use disorder, continuous   ? Ulcer of right heel ([place])   ? followed by podiatry--fully healed 09/2020  ? ? ?Past Surgical History:  ?Procedure Laterality Date  ? DG GREAT TOE LEFT FOOT  2010  ? s/p I and D for osteo  ? ? ?There were no vitals filed for this visit. ? ? Subjective Assessment - 09/04/21 0848   ? ? Subjective  "I've been getting busy with work."   ? Patient is accompanied by: Family member   ? Pertinent History poorly controlled T2DM with peripheral neuropathy, bipolar disorder, OSA who was admitted to [facility] on 05/26/21 with worsening of BLE weakness and  numbness for 3 weeks progressing to inability to walk x one day. Neurology consulted and MRI spine done revealing severe canal stenosis L3/L4,  moderate canal stenosis L2/L3 and mild to moderate canal stenosis with  moderate to severe left foraminal stenosis at L4/L5.   ? Limitations Muscle weakness, mild BUE tremors, impaired balance and gait, hx of falling, neuropathy in BLEs   ? Patient Stated Goals "I want to improve my balance so I can safely be in my kitchen to cook."   ? Currently in Pain? Yes   ? Pain Score 1    ? Pain Location Back   ? Pain Orientation Lower   ? Pain Descriptors / Indicators Aching   ? Pain Type Chronic pain   ? Pain Onset More than a month ago   ? Pain Frequency Intermittent   ? Aggravating Factors  sitting   ? Pain Relieving Factors movement/stretch   ? Effect of Pain on Daily Activities decreased tolerance for prolonged sitting   ? Multiple Pain Sites No   ? ?  ?  ? ?  ? ?Occupational Therapy Treatment: ?Therapeutic Activity: ?Pt participated in dynamic standing activities, working to reduce fall risk with self care, and pt continues to work towards use of LRD.  Pt stood in front of elevated surface to numerically sort deck of cards x3 trials.  1st trial pt tolerated standing without UE support for most of tasks, 1  brief balance check, and 1 seated rest break to complete the deck.  2nd trial pt was able to maintain no UE support for entire deck.  3rd trial pt required brief unilateral support on 1 occasion, but was able to stand for entire task.  Progressed to sorting/discarding cards in standing placing cards to elevated surfaces to pt's L and R using same arm for same side; pt required unilateral support for 75% of task on 1st trial, 50% of task for 2nd trial.  Progressed to contralateral reaching for same task to facilitate trunk rotation; pt maintained unilateral support throughout task with intermittent min guard from OT d/t multiple small LOB.  Frequent sitting breaks needed d/t LE fatigue.  ? ?Response to Treatment: ?See Plan/clinical impression below. ? ? ? OT Education - 09/04/21 1245   ? ? Education Details dynamic  standing activities/walker placement   ? Person(s) Educated Patient   ? Methods Explanation;Verbal cues;Demonstration;Tactile cues   ? Comprehension Verbalized understanding;Verbal cues required;Returned demonstration;Need further instruction;Tactile cues required   ? ?  ?  ? ?  ? ? ? OT Short Term Goals - 08/21/21 0951   ? ?  ? OT SHORT TERM GOAL #1  ? Title Pt will verbalize and demonstrate 3 fall prevention strategies to reduce fall risk with ADLs/IADLs.   ? Baseline Eval: not yet initiated; 08/21/21: identifies 3 strategies with min vc   ? Time 6   ? Period Weeks   ? Status Partially Met   ? Target Date 10/02/21   ?  ? OT SHORT TERM GOAL #2  ? Title Pt will verbalize 3 compensatory strategies to manage BUE tremors during ADL completion.   ? Baseline Eval: education needed; 08/21/21: Able to identify 2 strategies with min vc   ? Time 6   ? Period Weeks   ? Status Partially Met   ? Target Date 10/02/21   ? ?  ?  ? ?  ? ? ? ? OT Long Term Goals - 08/21/21 0953   ? ?  ? OT LONG TERM GOAL #1  ? Title Pt will increase FOTO score to 60 or better to indicate increased functional performance.   ? Baseline Eval: FOTO 51; 08/21/21: 48.4   ? Time 12   ? Period Weeks   ? Status On-going   ? Target Date 11/12/21   ?  ? OT LONG TERM GOAL #2  ? Title Pt will increase functional reach to >12" without UE support to decrease fall risk with functional reaching during daily tasks.   ? Baseline Eval: Functional reach 9" without AD, 13" with 1 hand on support surface; 08/21/21: 12" without AD, 14" with 1 hand on support surface   ? Time 12   ? Period Weeks   ? Status Revised   ? Target Date 11/12/21   ?  ? OT LONG TERM GOAL #3  ? Title Pt will increase 360 degree turn test score to WNL (4 secs or less) without AD to indicate improved dynamic standing/decreased fall risk within small spaces for kitchen mobility.   ? Baseline Eval: 360 turn test score 7 sec with RW; 08/21/21: 4 sec with RW   ? Time 12   ? Period Weeks   ? Status Revised   ?  Target Date 11/12/21   ?  ? OT LONG TERM GOAL #4  ? Title Pt will be modified indep to perform hot meal prep at stove top.   ? Baseline Eval: Dep  for hot meal prep, using microwave only and preparing cold meals d/t limited ability to release hands from walker; 08/21/21: Pt managing cold and hot LIGHT meal prep (microwaveable) with modified indep.   ? Time 12   ? Period Weeks   ? Status On-going   ? Target Date 11/12/21   ?  ? OT LONG TERM GOAL #5  ? Title Pt will manage laundry tasks with modified indep.   ? Baseline Eval: max A; brother currently managing pt's laundry; 08/21/21: pt has not yet attempted laundry, but educated today on transport strategies to manage laundry up/down stairs of apartment   ? Time 6   ? Period Weeks   ? Status On-going   ? Target Date 11/12/21   ?  ? OT LONG TERM GOAL #6  ? Title Pt will increase bilat grip strength by 20 or more lbs to improve ability to hold and carry ADL supplies.   ? Baseline Eval: R grip strength 66 lbs, L 68 (~20 below normal range for pt's age and gender); 08/21/21: R grip 79, L grip 80   ? Time 12   ? Period Weeks   ? Status Revised   ? Target Date 11/12/21   ? ?  ?  ? ?  ? ? ? Plan - 09/04/21 1406   ? ? Clinical Impression Statement Pt making steady improvements with performance of dynamic standing tasks alternating between unilateral support and no support.  RW or other support surface always near by as pt continues to have frequent balance checks with dynamic standing tasks.  Pt reports managing stairs at home is significantly easier now that pt has a 2nd RW.  Pt reports some low back pain when stooping over sink to wash dishes at home; OT encouraged standing with legs up against sink and moving walker to the side to minimize stooping, and encouraged chair placed behind him to allow frequent sitting breaks to minimize back pain.  Pt verbalized understanding.  Pt will continue to benefit from skilled OT for increasing dynamic standing balance and functional reaching  to decrease fall risk during daily tasks, and working toward progression of LRD during daily tasks.   ? OT Occupational Profile and History Problem Focused Assessment - Including review of records relating to presen

## 2021-09-06 ENCOUNTER — Ambulatory Visit: Payer: Medicare Other

## 2021-09-06 ENCOUNTER — Telehealth: Payer: Self-pay | Admitting: Physical Therapy

## 2021-09-06 ENCOUNTER — Ambulatory Visit: Payer: Medicare Other | Admitting: Physical Therapy

## 2021-09-06 NOTE — Telephone Encounter (Signed)
Contacted patient regarding missed appointment this date. Left VM and requested call back.  ?-Thresa Ross PT, DPT  ? ?

## 2021-09-11 ENCOUNTER — Encounter: Payer: Self-pay | Admitting: Physical Therapy

## 2021-09-11 ENCOUNTER — Ambulatory Visit: Payer: Medicare Other

## 2021-09-11 ENCOUNTER — Other Ambulatory Visit: Payer: Self-pay

## 2021-09-11 DIAGNOSIS — R2681 Unsteadiness on feet: Secondary | ICD-10-CM

## 2021-09-11 DIAGNOSIS — M6281 Muscle weakness (generalized): Secondary | ICD-10-CM | POA: Diagnosis not present

## 2021-09-11 DIAGNOSIS — R262 Difficulty in walking, not elsewhere classified: Secondary | ICD-10-CM

## 2021-09-11 DIAGNOSIS — R2689 Other abnormalities of gait and mobility: Secondary | ICD-10-CM

## 2021-09-11 DIAGNOSIS — R278 Other lack of coordination: Secondary | ICD-10-CM

## 2021-09-11 DIAGNOSIS — M5106 Intervertebral disc disorders with myelopathy, lumbar region: Secondary | ICD-10-CM

## 2021-09-11 NOTE — Therapy (Signed)
[place] [facility] ?[place], [place], [address] ?Phone: [phone]   Fax:  [phone] ? ?Physical Therapy Treatment/Recert ? ?Patient Details  ?Name: Jerry Wheeler ?MRN: [id number] ?Date of Birth: Oct 20, 1975 ?Referring Provider (PT): Reesa Chew PA/ Netty Starring PCP ? ? ?Encounter Date: 09/11/2021 ? ? PT End of Session - 09/11/21 6237   ? ? Visit Number 14   ? Number of Visits 23   ? Date for PT Re-Evaluation 11/06/21   ? Authorization Type medicare   ? PT Start Time [phone]   ? PT Stop Time 0850   ? PT Time Calculation (min) 39 min   ? Equipment Utilized During Treatment Gait belt   ? Activity Tolerance Patient tolerated treatment well   ? Behavior During Therapy [facility] for tasks assessed/performed   ? ?  ?  ? ?  ? ? ?Past Medical History:  ?Diagnosis Date  ? Anxiety disorder   ? Bipolar disorder ([place])   ? Diabetes mellitus ([place])   ? Diabetic peripheral neuropathy associated with type 2 diabetes mellitus ([place])   ? Emphysema lung ([place])   ? hospitalization 1999  ? High blood pressure   ? Major depressive disorder in partial remission ([place])   ? Morbid obesity ([place])   ? Osteomyelitis of great toe of left foot ([place])   ? Sleep apnea   ? Tobacco use disorder, continuous   ? Ulcer of right heel ([place])   ? followed by podiatry--fully healed 09/2020  ? ? ?Past Surgical History:  ?Procedure Laterality Date  ? DG GREAT TOE LEFT FOOT  2010  ? s/p I and D for osteo  ? ? ?There were no vitals filed for this visit. ? ? Subjective Assessment - 09/11/21 0812   ? ? Subjective Pt reports soreness in low back but no pain. Denies any falls or recent updates/changes.   ? Pertinent History 46 yo Male admitted to the hospital 05/26/2021 - 06/01/2021 at [facility] with lower extremity weakness secondary to peripheral neuropathy and severe spinal stenosis seen on MRI on 05/26/2021. He was discharged to [facility] rehab 06/01/2021 - 06/09/2021. He was discharged to his brother's home. Prior  to weakness he was in a 2 story apartment with stairs to enter. Therefore he is currently living at his brothers home which is 2 story but living is on the main floor; he does have 4 steps to enter home with B railings; He reports navigating those steps well. He is currenlty ambulating with RW, able to take a few steps without walker. Reports falling on 12/9 prior to admittance but otherwise no new falls. He denies any significant numbness/tingling; He does have neuropathy in the feet from diabetes but he doesn't feel that it has worsened. He does have an appointment with Dr. Lacinda Axon (neurosurgeon); He is not sure that they will recommend surgery due to uncontrolled diabetes and concern that surgery may not improve condition;   ? How long can you sit comfortably? several hours   ? How long can you stand comfortably? Unsure- able to stand at least 5 min but hasn't tested it.   ? How long can you walk comfortably? about 500 feet with fatigue, does use RW   ? Diagnostic tests MRI on 05/26/21 IMPRESSION:  1. Multilevel lumbar spondylosis, as detailed above.  2. Severe canal stenosis at L3-L4.  3. Moderate canal stenosis at L2-L3.  4. Mild-to-moderate canal stenosis with moderate-severe left  foraminal stenosis at L4-L5.   ?  Patient Stated Goals "get back into my apartment- negotiate steps, walk without walker"   ? Currently in Pain? No/denies   ? Pain Onset Yesterday   ? ?  ?  ? ?  ? ? ? ? ? [facility] PT Assessment - 09/11/21 0820   ? ?  ? Strength  ? Right Hip Flexion 5/5   ? Right Hip ABduction 5/5   ? Right Hip ADduction 5/5   ? Left Hip Flexion 4+/5   ? Left Hip ABduction 5/5   ? Left Hip ADduction 5/5   ? Right Ankle Dorsiflexion 2+/5   ? Left Ankle Dorsiflexion 2+/5   ?  ? Berg Balance Test  ? Sit to Stand Able to stand  independently using hands   ? Standing Unsupported Able to stand 30 seconds unsupported   ? Sitting with Back Unsupported but Feet Supported on Floor or Stool Able to sit safely and securely 2 minutes   ?  Stand to Sit Controls descent by using hands   ? Transfers Able to transfer safely, definite need of hands   ? Standing Unsupported with Eyes Closed Able to stand 10 seconds with supervision   ? Standing Unsupported with Feet Together Able to place feet together independently and stand for 1 minute with supervision   ? From Standing, Reach Forward with Outstretched Arm Can reach confidently >25 cm (10")   ? From Standing Position, Pick up Object from Floor Unable to pick up shoe, but reaches 2-5 cm (1-2") from shoe and balances independently   ? From Standing Position, Turn to Look Behind Over each Shoulder Looks behind from both sides and weight shifts well   ? Turn 360 Degrees Needs close supervision or verbal cueing   ? Standing Unsupported, Alternately Place Feet on Step/Stool Able to complete >2 steps/needs minimal assist   ? Standing Unsupported, One Foot in Front Able to plae foot ahead of the other independently and hold 30 seconds   ? Standing on One Leg Tries to lift leg/unable to hold 3 seconds but remains standing independently   ? Total Score 37   ? ?  ?  ? ?  ? ? ?Physical therapy Reassessment performed for short term and long term goals as pt is at end of current certified POC. Pt remains significantly weak in ankle decreasing gait progression and balance without need for RW. Pt making excellent progress with hip strength and towards goal of Berg balance improving score from 25/56 to 37/56 with a goal of > 42/56. New goal written for improving standing balance to improve safe independence in standing ADL's without UE support. Discussion with pt on progress towards goals and benefits of additional therapy.  ? ? PT Education - 09/11/21 0813   ? ? Education Details Progress towards goals.   ? Person(s) Educated Patient   ? Methods Explanation;Verbal cues;Tactile cues   ? Comprehension Verbalized understanding;Returned demonstration;Verbal cues required   ? ?  ?  ? ?  ? ? ? PT Short Term Goals - 09/11/21  0815   ? ?  ? PT SHORT TERM GOAL #1  ? Title Patient will be adherent to HEP at least 3x a week to improve functional strength and balance for better safety at home.   ? Baseline 2/27: reports doing them 3-4x a week;   ? Time 4   ? Period Weeks   ? Status Achieved   ? Target Date 08/10/21   ?  ? PT SHORT TERM GOAL #  2  ? Title Patient will demonstrate improvement in foot/ankle strength by being able to complete BLE heel raise with minimal UE support for better gait safety and mobility;   ? Baseline 2/27: unable; 09/11/21: unable   ? Time 4   ? Period Weeks   ? Status Not Met   ? Target Date 10/09/21   ? ?  ?  ? ?  ? ? ? ? PT Long Term Goals - 09/11/21 0822   ? ?  ? PT LONG TERM GOAL #1  ? Title Patient will increase BLE gross strength to 4+/5  especially in hip as to improve functional strength for independent gait, increased standing tolerance and increased ADL ability.   ? Baseline 2/27: see flowsheet; 09/11/21: see flowsheet, remains limited significantly in ankle strength   ? Time 8   ? Period Weeks   ? Status Partially Met   ? Target Date 11/06/21   ?  ? PT LONG TERM GOAL #2  ? Title Patient will increase six minute walk test distance to >800 for progression toward community ambulator and improve gait ability   ? Baseline 2/27: 850 feet with RW   ? Time 8   ? Period Weeks   ? Status Achieved   ? Target Date 09/07/21   ?  ? PT LONG TERM GOAL #3  ? Title Patient will demonstrate an improved Berg Balance Score of > 42/56 as to demonstrate improved balance with ADLs such as sitting/standing and transfer balance and reduced fall risk.   ? Baseline 2/27: 25/56; 09/11/21: 37/56   ? Time 8   ? Period Weeks   ? Status On-going   ? Target Date 11/06/21   ?  ? PT LONG TERM GOAL #4  ? Title Patient will reduce timed up and go to <14 seconds to reduce fall risk and demonstrate improved transfer/gait ability.   ? Baseline 2/27: 9.48 sec with RW;   ? Time 8   ? Period Weeks   ? Status Achieved   ? Target Date 09/07/21   ?  ? PT  LONG TERM GOAL #5  ? Title Pt will be able to tolerate standing >/= 3 min without UE support to demonstrate ability to complete grooming/bathing tasks and other standing ADL's without LOB.   ? Baseline

## 2021-09-11 NOTE — Therapy (Signed)
[place] ?[facility] ?[place], [place], [address] ?Phone: [phone]   Fax:  [phone] ? ?Occupational Therapy Treatment ? ?Patient Details  ?Name: Rane Blitch ?MRN: [id number] ?Date of Birth: 1976-03-21 ?Referring Provider (OT): Reesa Chew, [place] ? ? ?Encounter Date: 09/11/2021 ? ? OT End of Session - 09/11/21 0940   ? ? Visit Number 14   ? Number of Visits 34   ? Date for OT Re-Evaluation 11/12/21   ? Authorization Time Period reporting period beginning 08/21/21   ? OT Start Time [phone]   ? OT Stop Time 0930   ? OT Time Calculation (min) 40 min   ? Equipment Utilized During Treatment RW   ? Activity Tolerance Patient tolerated treatment well   ? Behavior During Therapy [facility] for tasks assessed/performed   ? ?  ?  ? ?  ? ? ?Past Medical History:  ?Diagnosis Date  ? Anxiety disorder   ? Bipolar disorder ([place])   ? Diabetes mellitus ([place])   ? Diabetic peripheral neuropathy associated with type 2 diabetes mellitus ([place])   ? Emphysema lung ([place])   ? hospitalization 1999  ? High blood pressure   ? Major depressive disorder in partial remission ([place])   ? Morbid obesity ([place])   ? Osteomyelitis of great toe of left foot ([place])   ? Sleep apnea   ? Tobacco use disorder, continuous   ? Ulcer of right heel ([place])   ? followed by podiatry--fully healed 09/2020  ? ? ?Past Surgical History:  ?Procedure Laterality Date  ? DG GREAT TOE LEFT FOOT  2010  ? s/p I and D for osteo  ? ? ?There were no vitals filed for this visit. ? ? Subjective Assessment - 09/11/21 0854   ? ? Subjective  "My back is a little sore today, but it's ok."   ? Patient is accompanied by: Family member   ? Pertinent History poorly controlled T2DM with peripheral neuropathy, bipolar disorder, OSA who was admitted to [facility] on 05/26/21 with worsening of BLE weakness and  numbness for 3 weeks progressing to inability to walk x one day. Neurology consulted and MRI spine done revealing severe canal stenosis  L3/L4, moderate canal stenosis L2/L3 and mild to moderate canal stenosis with  moderate to severe left foraminal stenosis at L4/L5.   ? Limitations Muscle weakness, mild BUE tremors, impaired balance and gait, hx of falling, neuropathy in BLEs   ? Patient Stated Goals "I want to improve my balance so I can safely be in my kitchen to cook."   ? Currently in Pain? Yes   ? Pain Score 2    ? Pain Location Back   ? Pain Orientation Lower   ? Pain Descriptors / Indicators Aching   ? Pain Type Chronic pain   ? Pain Onset More than a month ago   ? Pain Frequency Intermittent   ? Aggravating Factors  prolonged sitting or lying down   ? Pain Relieving Factors movement/stretch   ? Effect of Pain on Daily Activities decreased tolerance for prolonged sitting   ? Multiple Pain Sites No   ? ?  ?  ? ?  ? ?Occupational Therapy Treatment: ?Therapeutic Activity: ?Pt participated in dynamic standing activities, working to reduce fall risk with self care, and pt continues to work towards use of LRD.  Pt stood in front of elevated surface to numerically sort deck of cards x2 trials.  1st trial pt tolerated standing  without UE support for most of tasks, 1 brief balance check, and no seated rest break to complete the deck.  2nd trial pt was able to maintain no UE support for entire deck.  Progressed to sorting/discarding cards in standing placing cards to elevated surfaces to pt's L and R using same arm for same side; pt had 7 balance checks with brief return of hands to walker for this task.  Progressed to contralateral reaching for same task to facilitate trunk rotation; pt maintained unilateral support for 75% of task with intermittent min guard from OT d/t multiple small LOB.  Vc to slow pace to find balance before trunk rotation, then pt was able to use mostly fingertips or loose grip on walker for remainder of task. ?  ?Self Care: ?Recommendations made for grocery transport from front door to kitchen, including use of  collapsible/rolling cart.  Provided picture and options to obtain.  Pt receptive.   ? ?Therapeutic Exercise: ?Performed bilat grip strengthening with hand gripper at strong resistance (1 green band, 1 red band) x3 sets 10 each hand, working to increase grip strength for better hold and manipulation of ADL supplies.  ? ?Response to Treatment: ?See Plan/clinical impression below. ? ? ? OT Education - 09/11/21 0855   ? ? Education Details Recommendation to utilize small/collapsable rolling cart for moving grocery bags from front door to kitchen   ? Person(s) Educated Patient   ? Methods Explanation;Verbal cues   ? Comprehension Verbalized understanding;Verbal cues required   ? ?  ?  ? ?  ? ? ? OT Short Term Goals - 08/21/21 0951   ? ?  ? OT SHORT TERM GOAL #1  ? Title Pt will verbalize and demonstrate 3 fall prevention strategies to reduce fall risk with ADLs/IADLs.   ? Baseline Eval: not yet initiated; 08/21/21: identifies 3 strategies with min vc   ? Time 6   ? Period Weeks   ? Status Partially Met   ? Target Date 10/02/21   ?  ? OT SHORT TERM GOAL #2  ? Title Pt will verbalize 3 compensatory strategies to manage BUE tremors during ADL completion.   ? Baseline Eval: education needed; 08/21/21: Able to identify 2 strategies with min vc   ? Time 6   ? Period Weeks   ? Status Partially Met   ? Target Date 10/02/21   ? ?  ?  ? ?  ? ? ? ? OT Long Term Goals - 08/21/21 0953   ? ?  ? OT LONG TERM GOAL #1  ? Title Pt will increase FOTO score to 60 or better to indicate increased functional performance.   ? Baseline Eval: FOTO 51; 08/21/21: 48.4   ? Time 12   ? Period Weeks   ? Status On-going   ? Target Date 11/12/21   ?  ? OT LONG TERM GOAL #2  ? Title Pt will increase functional reach to >12" without UE support to decrease fall risk with functional reaching during daily tasks.   ? Baseline Eval: Functional reach 9" without AD, 13" with 1 hand on support surface; 08/21/21: 12" without AD, 14" with 1 hand on support surface   ? Time  12   ? Period Weeks   ? Status Revised   ? Target Date 11/12/21   ?  ? OT LONG TERM GOAL #3  ? Title Pt will increase 360 degree turn test score to WNL (4 secs or less) without AD to indicate  improved dynamic standing/decreased fall risk within small spaces for kitchen mobility.   ? Baseline Eval: 360 turn test score 7 sec with RW; 08/21/21: 4 sec with RW   ? Time 12   ? Period Weeks   ? Status Revised   ? Target Date 11/12/21   ?  ? OT LONG TERM GOAL #4  ? Title Pt will be modified indep to perform hot meal prep at stove top.   ? Baseline Eval: Dep for hot meal prep, using microwave only and preparing cold meals d/t limited ability to release hands from walker; 08/21/21: Pt managing cold and hot LIGHT meal prep (microwaveable) with modified indep.   ? Time 12   ? Period Weeks   ? Status On-going   ? Target Date 11/12/21   ?  ? OT LONG TERM GOAL #5  ? Title Pt will manage laundry tasks with modified indep.   ? Baseline Eval: max A; brother currently managing pt's laundry; 08/21/21: pt has not yet attempted laundry, but educated today on transport strategies to manage laundry up/down stairs of apartment   ? Time 6   ? Period Weeks   ? Status On-going   ? Target Date 11/12/21   ?  ? OT LONG TERM GOAL #6  ? Title Pt will increase bilat grip strength by 20 or more lbs to improve ability to hold and carry ADL supplies.   ? Baseline Eval: R grip strength 66 lbs, L 68 (~20 below normal range for pt's age and gender); 08/21/21: R grip 79, L grip 80   ? Time 12   ? Period Weeks   ? Status Revised   ? Target Date 11/12/21   ? ?  ?  ? ?  ? ? ? Plan - 09/11/21 0956   ? ? Clinical Impression Statement Pt reports he is thinking about moving from his condo to a more accessible place without steps and 1 level only.  Pt is having groceries delivered, but also requires assist to transport bags from front door to kitchen d/t decreased balance and requirement of walker.  Pt was receptive to rolling cart recommendation, but will plan to address  item transport with RW in future sessions.  Pt will continue to benefit from skilled OT for increasing dynamic standing balance, functional reaching, and item transport in order to decrease fall risk during daily tas

## 2021-09-13 ENCOUNTER — Ambulatory Visit: Payer: Medicare Other | Admitting: Physical Therapy

## 2021-09-13 ENCOUNTER — Ambulatory Visit: Payer: Medicare Other

## 2021-09-13 ENCOUNTER — Other Ambulatory Visit: Payer: Self-pay

## 2021-09-13 ENCOUNTER — Encounter: Payer: Self-pay | Admitting: Physical Therapy

## 2021-09-13 DIAGNOSIS — R2689 Other abnormalities of gait and mobility: Secondary | ICD-10-CM

## 2021-09-13 DIAGNOSIS — M6281 Muscle weakness (generalized): Secondary | ICD-10-CM

## 2021-09-13 DIAGNOSIS — R2681 Unsteadiness on feet: Secondary | ICD-10-CM

## 2021-09-13 DIAGNOSIS — R262 Difficulty in walking, not elsewhere classified: Secondary | ICD-10-CM

## 2021-09-13 NOTE — Therapy (Signed)
[place] [facility] ?[place], [place], [address] ?Phone: [phone]   Fax:  [phone] ? ?Physical Therapy Treatment ? ?Patient Details  ?Name: Jerry Wheeler ?MRN: [id number] ?Date of Birth: February 11, 1976 ?Referring Provider (PT): Reesa Chew PA/ Netty Starring PCP ? ? ?Encounter Date: 09/13/2021 ? ? PT End of Session - 09/13/21 0813   ? ? Visit Number 15   ? Number of Visits 23   ? Date for PT Re-Evaluation 11/06/21   ? Authorization Type medicare   ? PT Start Time [id number]   ? PT Stop Time 0850   ? PT Time Calculation (min) 40 min   ? Equipment Utilized During Treatment Gait belt   ? Activity Tolerance Patient tolerated treatment well   ? Behavior During Therapy [facility] for tasks assessed/performed   ? ?  ?  ? ?  ? ? ?Past Medical History:  ?Diagnosis Date  ? Anxiety disorder   ? Bipolar disorder ([place])   ? Diabetes mellitus ([place])   ? Diabetic peripheral neuropathy associated with type 2 diabetes mellitus ([place])   ? Emphysema lung ([place])   ? hospitalization 1999  ? High blood pressure   ? Major depressive disorder in partial remission ([place])   ? Morbid obesity ([place])   ? Osteomyelitis of great toe of left foot ([place])   ? Sleep apnea   ? Tobacco use disorder, continuous   ? Ulcer of right heel ([place])   ? followed by podiatry--fully healed 09/2020  ? ? ?Past Surgical History:  ?Procedure Laterality Date  ? DG GREAT TOE LEFT FOOT  2010  ? s/p I and D for osteo  ? ? ?There were no vitals filed for this visit. ? ? Subjective Assessment - 09/13/21 0811   ? ? Subjective Pt reports soreness in low back but no pain. Denies any falls or recent updates/changes. He did see neurosurgeon who recommended that he follow up with neurologist. Will pursue that.   ? Pertinent History 46 yo Male admitted to the hospital 05/26/2021 - 06/01/2021 at [facility] with lower extremity weakness secondary to peripheral neuropathy and severe spinal stenosis seen on MRI on 05/26/2021. He was discharged to  [facility] rehab 06/01/2021 - 06/09/2021. He was discharged to his brother's home. Prior to weakness he was in a 2 story apartment with stairs to enter. Therefore he is currently living at his brothers home which is 2 story but living is on the main floor; he does have 4 steps to enter home with B railings; He reports navigating those steps well. He is currenlty ambulating with RW, able to take a few steps without walker. Reports falling on 12/9 prior to admittance but otherwise no new falls. He denies any significant numbness/tingling; He does have neuropathy in the feet from diabetes but he doesn't feel that it has worsened. He does have an appointment with Dr. Lacinda Axon (neurosurgeon); He is not sure that they will recommend surgery due to uncontrolled diabetes and concern that surgery may not improve condition;   ? How long can you sit comfortably? several hours   ? How long can you stand comfortably? Unsure- able to stand at least 5 min but hasn't tested it.   ? How long can you walk comfortably? about 500 feet with fatigue, does use RW   ? Diagnostic tests MRI on 05/26/21 IMPRESSION:  1. Multilevel lumbar spondylosis, as detailed above.  2. Severe canal stenosis at L3-L4.  3. Moderate canal stenosis at L2-L3.  4. Mild-to-moderate canal stenosis with moderate-severe left  foraminal stenosis at L4-L5.   ? Patient Stated Goals "get back into my apartment- negotiate steps, walk without walker"   ? Currently in Pain? Yes   ? Pain Score 2    ? Pain Location Back   ? Pain Orientation Lower   ? Pain Descriptors / Indicators Aching;Sore   ? Pain Type Chronic pain   ? Pain Onset More than a month ago   ? Pain Frequency Intermittent   ? Aggravating Factors  prolonged sitting   ? Pain Relieving Factors movement/stretch   ? Effect of Pain on Daily Activities decreased activity tolerance;   ? Multiple Pain Sites No   ? ?  ?  ? ?  ? ? ? ? ? ?The following activities were completed at balance bar  ?Unless otherwise stated, CGA was  provided and gait belt donned in order to ensure pt safety with standing activities  ?  ?Ex:  ?Leg press ( adjusted to accommodate pt height)  ?1 set 70# x 10 reps  ?1 set 80# x 10 reps , medium difficulty; ?1 set at 100# x10 reps;  ?Patient exhibits  better ability to get LE on/off machine; ? ?Standing in parallel bars:  hip flexion march against green tband 2x15 reps each LE, moderate difficulty;  ?  ?Standing with green tband around BLE:  ?Hip abduction SLR x10 reps each LE with cues to avoid hip ER and improve upper trunk control and reduce UE weight bearing  ?Side stepping x10 feet x2 laps with BUE rail assist (finger tip hold) ?Hip extension x10 reps each with better hip contraction noted this session; He does require cues to keep erect posture and to improve LE positioning for optimal strengthening;  ?  ?NMR:   ?Standing on firm surface with feet apart: holding green pball in BUE, bending down and tapping floor and then standing up x5 reps  ?Standing on firm surface in parallel bars: ?BUE holding green pball: trunk rotation moving ball side/side x5 reps with min A for safety and moderate instability noted;  ? ? Instructed patient in balance master exercises: ?Scales side/side weight shift, platform stability at 6.0, x2 sets of 30 sec with BUE rail assist ?Instructed patient in standing on platform 6.0 stability: ? Playing 2048: x1 min unsupported, min A for safety with heavy shift towards RLE and increased fasciculation in BLE with increased LE fatigue; ?  X1 min with feet staggered (one in front of the other)  ?He exhibits better stability with LLE ahead of RLE, reporting increased ease, likely due to increased weight shift to RLE;  ?  ?Pt required occasional rest breaks due fatigue, PT was quick to ask when pt appeared to be fatiguing in order to prevent excessive fatigue. ?  ?Pt educated throughout session about proper posture and technique with exercises. Improved exercise technique, movement at target  joints, use of target muscles after min to mod verbal, visual, tactile cues. ?  ? ? ? ? ? ? ? ? ? ? ? ? ? ? ? ? ? ? ? ? ? ? ? ? PT Education - 09/13/21 0813   ? ? Education Details LE strengthening/balance;   ? Person(s) Educated Patient   ? Methods Explanation;Verbal cues   ? Comprehension Verbalized understanding;Returned demonstration;Verbal cues required;Need further instruction   ? ?  ?  ? ?  ? ? ? PT Short Term Goals - 09/11/21 0815   ? ?  ?  PT SHORT TERM GOAL #1  ? Title Patient will be adherent to HEP at least 3x a week to improve functional strength and balance for better safety at home.   ? Baseline 2/27: reports doing them 3-4x a week;   ? Time 4   ? Period Weeks   ? Status Achieved   ? Target Date 08/10/21   ?  ? PT SHORT TERM GOAL #2  ? Title Patient will demonstrate improvement in foot/ankle strength by being able to complete BLE heel raise with minimal UE support for better gait safety and mobility;   ? Baseline 2/27: unable; 09/11/21: unable   ? Time 4   ? Period Weeks   ? Status Not Met   ? Target Date 10/09/21   ? ?  ?  ? ?  ? ? ? ? PT Long Term Goals - 09/11/21 0822   ? ?  ? PT LONG TERM GOAL #1  ? Title Patient will increase BLE gross strength to 4+/5  especially in hip as to improve functional strength for independent gait, increased standing tolerance and increased ADL ability.   ? Baseline 2/27: see flowsheet; 09/11/21: see flowsheet, remains limited significantly in ankle strength   ? Time 8   ? Period Weeks   ? Status Partially Met   ? Target Date 11/06/21   ?  ? PT LONG TERM GOAL #2  ? Title Patient will increase six minute walk test distance to >800 for progression toward community ambulator and improve gait ability   ? Baseline 2/27: 850 feet with RW   ? Time 8   ? Period Weeks   ? Status Achieved   ? Target Date 09/07/21   ?  ? PT LONG TERM GOAL #3  ? Title Patient will demonstrate an improved Berg Balance Score of > 42/56 as to demonstrate improved balance with ADLs such as  sitting/standing and transfer balance and reduced fall risk.   ? Baseline 2/27: 25/56; 09/11/21: 37/56   ? Time 8   ? Period Weeks   ? Status On-going   ? Target Date 11/06/21   ?  ? PT LONG TERM GOAL #4  ? Title Patient

## 2021-09-13 NOTE — Therapy (Signed)
[place] [facility] ?[place], [place], [address] ?Phone: [phone]   Fax:  [phone] ? ?Occupational Therapy Treatment ? ?Patient Details  ?Name: Jerry Wheeler ?MRN: [id number] ?Date of Birth: 10/23/1975 ?Referring Provider (OT): Reesa Chew, [place] ? ? ?Encounter Date: 09/13/2021 ? ? OT End of Session - 09/13/21 2029   ? ? Visit Number 15   ? Number of Visits 34   ? Date for OT Re-Evaluation 11/12/21   ? Authorization Time Period reporting period beginning 08/21/21   ? OT Start Time [phone]   ? OT Stop Time 0930   ? OT Time Calculation (min) 40 min   ? Equipment Utilized During Treatment RW   ? Activity Tolerance Patient tolerated treatment well   ? Behavior During Therapy [facility] for tasks assessed/performed   ? ?  ?  ? ?  ? ? ?Past Medical History:  ?Diagnosis Date  ? Anxiety disorder   ? Bipolar disorder ([place])   ? Diabetes mellitus ([place])   ? Diabetic peripheral neuropathy associated with type 2 diabetes mellitus ([place])   ? Emphysema lung ([place])   ? hospitalization 1999  ? High blood pressure   ? Major depressive disorder in partial remission ([place])   ? Morbid obesity ([place])   ? Osteomyelitis of great toe of left foot ([place])   ? Sleep apnea   ? Tobacco use disorder, continuous   ? Ulcer of right heel ([place])   ? followed by podiatry--fully healed 09/2020  ? ? ?Past Surgical History:  ?Procedure Laterality Date  ? DG GREAT TOE LEFT FOOT  2010  ? s/p I and D for osteo  ? ? ?There were no vitals filed for this visit. ? ? Subjective Assessment - 09/13/21 2027   ? ? Subjective  "I could probably try some laundry this weekend."   ? Patient is accompanied by: Family member   ? Pertinent History poorly controlled T2DM with peripheral neuropathy, bipolar disorder, OSA who was admitted to [facility] on 05/26/21 with worsening of BLE weakness and  numbness for 3 weeks progressing to inability to walk x one day. Neurology consulted and MRI spine done revealing severe canal stenosis  L3/L4, moderate canal stenosis L2/L3 and mild to moderate canal stenosis with  moderate to severe left foraminal stenosis at L4/L5.   ? Limitations Muscle weakness, mild BUE tremors, impaired balance and gait, hx of falling, neuropathy in BLEs   ? Patient Stated Goals "I want to improve my balance so I can safely be in my kitchen to cook."   ? Currently in Pain? Yes   ? Pain Score 2    ? Pain Location Back   ? Pain Orientation Lower   ? Pain Descriptors / Indicators Aching;Sore   ? Pain Type Chronic pain   ? Pain Onset More than a month ago   ? Pain Frequency Intermittent   ? Aggravating Factors  prolonged sitting   ? Pain Relieving Factors movement/stretch   ? Effect of Pain on Daily Activities decreased activity tolerance   ? Multiple Pain Sites No   ? ?  ?  ? ?  ? ?Occupational Therapy Treatment: ?Therapeutic Exercise: ?Facilitated hand strengthening with use of hand gripper set at 28.9# to remove jumbo pegs from pegboard x3 trials each hand, working to increase B grip strength for holding and carrying ADL supplies. ? ?Therapeutic Activity: ?Simulated functional reaching for laundry and kitchen tasks placing cones at quad height on mat table,  reaching forward, R/L, alternating hands without UE support, wide BOS, supv-min guard with intermittent balance checks.  Incorporated trunk rotation R/L reaching for cones from each side and placing them forward on mat table, intermittent min guard.   ? ?Response to Treatment: ?See Plan/clinical impression below. ? ? ? ? OT Education - 09/12/21 2029   ? ? Education Details functional reaching   ? Person(s) Educated Patient   ? Methods Explanation;Verbal cues;Demonstration;Tactile cues   ? Comprehension Verbalized understanding;Verbal cues required;Returned demonstration;Need further instruction   ? ?  ?  ? ?  ? ? ? OT Short Term Goals - 08/21/21 0951   ? ?  ? OT SHORT TERM GOAL #1  ? Title Pt will verbalize and demonstrate 3 fall prevention strategies to reduce fall risk  with ADLs/IADLs.   ? Baseline Eval: not yet initiated; 08/21/21: identifies 3 strategies with min vc   ? Time 6   ? Period Weeks   ? Status Partially Met   ? Target Date 10/02/21   ?  ? OT SHORT TERM GOAL #2  ? Title Pt will verbalize 3 compensatory strategies to manage BUE tremors during ADL completion.   ? Baseline Eval: education needed; 08/21/21: Able to identify 2 strategies with min vc   ? Time 6   ? Period Weeks   ? Status Partially Met   ? Target Date 10/02/21   ? ?  ?  ? ?  ? ? ? ? OT Long Term Goals - 08/21/21 0953   ? ?  ? OT LONG TERM GOAL #1  ? Title Pt will increase FOTO score to 60 or better to indicate increased functional performance.   ? Baseline Eval: FOTO 51; 08/21/21: 48.4   ? Time 12   ? Period Weeks   ? Status On-going   ? Target Date 11/12/21   ?  ? OT LONG TERM GOAL #2  ? Title Pt will increase functional reach to >12" without UE support to decrease fall risk with functional reaching during daily tasks.   ? Baseline Eval: Functional reach 9" without AD, 13" with 1 hand on support surface; 08/21/21: 12" without AD, 14" with 1 hand on support surface   ? Time 12   ? Period Weeks   ? Status Revised   ? Target Date 11/12/21   ?  ? OT LONG TERM GOAL #3  ? Title Pt will increase 360 degree turn test score to WNL (4 secs or less) without AD to indicate improved dynamic standing/decreased fall risk within small spaces for kitchen mobility.   ? Baseline Eval: 360 turn test score 7 sec with RW; 08/21/21: 4 sec with RW   ? Time 12   ? Period Weeks   ? Status Revised   ? Target Date 11/12/21   ?  ? OT LONG TERM GOAL #4  ? Title Pt will be modified indep to perform hot meal prep at stove top.   ? Baseline Eval: Dep for hot meal prep, using microwave only and preparing cold meals d/t limited ability to release hands from walker; 08/21/21: Pt managing cold and hot LIGHT meal prep (microwaveable) with modified indep.   ? Time 12   ? Period Weeks   ? Status On-going   ? Target Date 11/12/21   ?  ? OT LONG TERM GOAL #5   ? Title Pt will manage laundry tasks with modified indep.   ? Baseline Eval: max A; brother currently managing pt's laundry;  08/21/21: pt has not yet attempted laundry, but educated today on transport strategies to manage laundry up/down stairs of apartment   ? Time 6   ? Period Weeks   ? Status On-going   ? Target Date 11/12/21   ?  ? OT LONG TERM GOAL #6  ? Title Pt will increase bilat grip strength by 20 or more lbs to improve ability to hold and carry ADL supplies.   ? Baseline Eval: R grip strength 66 lbs, L 68 (~20 below normal range for pt's age and gender); 08/21/21: R grip 79, L grip 80   ? Time 12   ? Period Weeks   ? Status Revised   ? Target Date 11/12/21   ? ?  ?  ? ?  ? ? ? ? ? ? ? ? Plan - 09/12/21 2044   ? ? Clinical Impression Statement Pt continues to develop dynamic standing balance and functional reaching for IADL tasks.  Pt was able to participate in functional reaching this day without UE support, supv-min guard, occasional balance checks.  Reaching is slow and guarded reaching just above knee level, but progression noted with ability to perform without UE support.  Pt will continue to benefit from skilled OT for further developing functional reaching and dynamic standing balance in order to maximize indep and safety with IADL tasks.   ? OT Occupational Profile and History Problem Focused Assessment - Including review of records relating to presenting problem   ? Occupational performance deficits (Please refer to evaluation for details): ADL's;IADL's   ? Body Structure / Function / Physical Skills ADL;Coordination;Endurance;GMC;UE functional use;Balance;Sensation;Body mechanics;Decreased knowledge of use of DME;IADL;Pain;Dexterity;FMC;Strength;Gait;Mobility   ? Rehab Potential Good   ? Clinical Decision Making Limited treatment options, no task modification necessary   ? Comorbidities Affecting Occupational Performance: May have comorbidities impacting occupational performance   ? Modification or  Assistance to Complete Evaluation  No modification of tasks or assist necessary to complete eval   ? OT Frequency 2x / week   ? OT Duration 12 weeks   ? OT Treatment/Interventions Self-care/ADL training;T

## 2021-09-18 ENCOUNTER — Ambulatory Visit: Payer: Medicare Other

## 2021-09-20 ENCOUNTER — Encounter: Payer: Self-pay | Admitting: Physical Therapy

## 2021-09-20 ENCOUNTER — Ambulatory Visit: Payer: Medicare Other | Admitting: Physical Therapy

## 2021-09-20 ENCOUNTER — Ambulatory Visit: Payer: Medicare Other | Attending: Physical Medicine and Rehabilitation | Admitting: Occupational Therapy

## 2021-09-20 DIAGNOSIS — R2681 Unsteadiness on feet: Secondary | ICD-10-CM

## 2021-09-20 DIAGNOSIS — R278 Other lack of coordination: Secondary | ICD-10-CM | POA: Diagnosis present

## 2021-09-20 DIAGNOSIS — M5106 Intervertebral disc disorders with myelopathy, lumbar region: Secondary | ICD-10-CM | POA: Insufficient documentation

## 2021-09-20 DIAGNOSIS — R262 Difficulty in walking, not elsewhere classified: Secondary | ICD-10-CM | POA: Diagnosis present

## 2021-09-20 DIAGNOSIS — M6281 Muscle weakness (generalized): Secondary | ICD-10-CM | POA: Insufficient documentation

## 2021-09-20 DIAGNOSIS — R2689 Other abnormalities of gait and mobility: Secondary | ICD-10-CM | POA: Insufficient documentation

## 2021-09-20 NOTE — Therapy (Signed)
[facility] ?[place], [place], [address] ?Phone: [phone]   Fax:  [phone] ? ?Physical Therapy Treatment ? ?Patient Details  ?Name: Jerry Wheeler ?MRN: [id number] ?Date of Birth: 23-Jan-1976 ?Referring Provider (PT): Reesa Chew PA/ Netty Starring PCP ? ? ?Encounter Date: 09/20/2021 ? ? PT End of Session - 09/20/21 1523   ? ? Visit Number 16   ? Number of Visits 23   ? Date for PT Re-Evaluation 11/06/21   ? Authorization Type medicare   ? PT Start Time 1147   ? PT Stop Time 1230   ? PT Time Calculation (min) 43 min   ? Equipment Utilized During Treatment Gait belt   ? Activity Tolerance Patient tolerated treatment well   ? Behavior During Therapy [facility] for tasks assessed/performed   ? ?  ?  ? ?  ? ? ?Past Medical History:  ?Diagnosis Date  ? Anxiety disorder   ? Bipolar disorder ([place])   ? Diabetes mellitus ([place])   ? Diabetic peripheral neuropathy associated with type 2 diabetes mellitus ([place])   ? Emphysema lung ([place])   ? hospitalization 1999  ? High blood pressure   ? Major depressive disorder in partial remission ([place])   ? Morbid obesity ([place])   ? Osteomyelitis of great toe of left foot ([place])   ? Sleep apnea   ? Tobacco use disorder, continuous   ? Ulcer of right heel ([place])   ? followed by podiatry--fully healed 09/2020  ? ? ?Past Surgical History:  ?Procedure Laterality Date  ? DG GREAT TOE LEFT FOOT  2010  ? s/p I and D for osteo  ? ? ?There were no vitals filed for this visit. ? ? Subjective Assessment - 09/20/21 1148   ? ? Subjective Pt reports no falls or stumbles recently. Pt reports he is not really using the walker at his house. He even reports he is not using it much when he is walking in his yard as he frequently has to lift it up and put it down.   ? Pertinent History 46 yo Male admitted to the hospital 05/26/2021 - 06/01/2021 at [facility] with lower extremity weakness secondary to peripheral neuropathy and severe spinal stenosis seen on  MRI on 05/26/2021. He was discharged to [facility] rehab 06/01/2021 - 06/09/2021. He was discharged to his brother's home. Prior to weakness he was in a 2 story apartment with stairs to enter. Therefore he is currently living at his brothers home which is 2 story but living is on the main floor; he does have 4 steps to enter home with B railings; He reports navigating those steps well. He is currenlty ambulating with RW, able to take a few steps without walker. Reports falling on 12/9 prior to admittance but otherwise no new falls. He denies any significant numbness/tingling; He does have neuropathy in the feet from diabetes but he doesn't feel that it has worsened. He does have an appointment with Dr. Lacinda Axon (neurosurgeon); He is not sure that they will recommend surgery due to uncontrolled diabetes and concern that surgery may not improve condition;   ? How long can you sit comfortably? several hours   ? How long can you stand comfortably? Unsure- able to stand at least 5 min but hasn't tested it.   ? How long can you walk comfortably? about 500 feet with fatigue, does use RW   ? Diagnostic tests MRI on 05/26/21 IMPRESSION:  1. Multilevel lumbar spondylosis,  as detailed above.  2. Severe canal stenosis at L3-L4.  3. Moderate canal stenosis at L2-L3.  4. Mild-to-moderate canal stenosis with moderate-severe left  foraminal stenosis at L4-L5.   ? Patient Stated Goals "get back into my apartment- negotiate steps, walk without walker"   ? Pain Onset More than a month ago   ? ?  ?  ? ?  ? ? ? ?10 meter walk no assistive device: 15.93 sec / .89ms ?- moderate signs of instability, no LOB noted  ? ?Obstacle course cones and 1/2 bolster step overs  ?-3 times through ?- good foot clearance.  ?-CGA throughout ?-mild unsteadiness noted, particularly with turns at end, fatigue noted at end of each bout and pt required rest break  ? ? ? ?  ?Ex:  ?Leg press ( adjusted to accommodate pt height)  ?1 set 70# x 10 reps  ?1 set 80# x 10  reps , medium difficulty; ?1 set at 100# x10 reps;  ?Patient exhibits  better ability to get LE on/off machine; ? ?Standing in parallel bars:  hip flexion march against green tband 2x15 reps each LE, moderate difficulty;  ?  ? ?  ?NMR:   ? ? Instructed patient in balance master exercises CGA getting on and off machine : ?Played ball popping game with B UE assist to work on weight shifting to edge of BOS. X many repetitions, pt used ankle and hip strategies to achieve movement.  ? ?Played scales game without UE assistance x 4 rounds ?- difficulty using ankle strategies.  ? ?Played maze game with UE assist, difficulty with slow and controlled tedious movements with this task. Completed in 124 seconds circular maze ?-   ?  ?Pt required occasional rest breaks due fatigue, PT was quick to ask when pt appeared to be fatiguing in order to prevent excessive fatigue. ?  ?Pt educated throughout session about proper posture and technique with exercises. Improved exercise technique, movement at target joints, use of target muscles after min to mod verbal, visual, tactile cues. ?  ? ? ? ? ? ? ? ? ? ? ? ? ? ? ? ? ? ? ? ? ? ? ? ? ? ? ? ? ? PT Education - 09/20/21 1150   ? ? Education Details Exercise form and technique   ? Person(s) Educated Patient   ? Methods Explanation   ? Comprehension Verbalized understanding;Returned demonstration;Verbal cues required;Tactile cues required   ? ?  ?  ? ?  ? ? ? PT Short Term Goals - 09/11/21 0815   ? ?  ? PT SHORT TERM GOAL #1  ? Title Patient will be adherent to HEP at least 3x a week to improve functional strength and balance for better safety at home.   ? Baseline 2/27: reports doing them 3-4x a week;   ? Time 4   ? Period Weeks   ? Status Achieved   ? Target Date 08/10/21   ?  ? PT SHORT TERM GOAL #2  ? Title Patient will demonstrate improvement in foot/ankle strength by being able to complete BLE heel raise with minimal UE support for better gait safety and mobility;   ? Baseline 2/27:  unable; 09/11/21: unable   ? Time 4   ? Period Weeks   ? Status Not Met   ? Target Date 10/09/21   ? ?  ?  ? ?  ? ? ? ? PT Long Term Goals - 09/11/21 0822   ? ?  ?  PT LONG TERM GOAL #1  ? Title Patient will increase BLE gross strength to 4+/5  especially in hip as to improve functional strength for independent gait, increased standing tolerance and increased ADL ability.   ? Baseline 2/27: see flowsheet; 09/11/21: see flowsheet, remains limited significantly in ankle strength   ? Time 8   ? Period Weeks   ? Status Partially Met   ? Target Date 11/06/21   ?  ? PT LONG TERM GOAL #2  ? Title Patient will increase six minute walk test distance to >800 for progression toward community ambulator and improve gait ability   ? Baseline 2/27: 850 feet with RW   ? Time 8   ? Period Weeks   ? Status Achieved   ? Target Date 09/07/21   ?  ? PT LONG TERM GOAL #3  ? Title Patient will demonstrate an improved Berg Balance Score of > 42/56 as to demonstrate improved balance with ADLs such as sitting/standing and transfer balance and reduced fall risk.   ? Baseline 2/27: 25/56; 09/11/21: 37/56   ? Time 8   ? Period Weeks   ? Status On-going   ? Target Date 11/06/21   ?  ? PT LONG TERM GOAL #4  ? Title Patient will reduce timed up and go to <14 seconds to reduce fall risk and demonstrate improved transfer/gait ability.   ? Baseline 2/27: 9.48 sec with RW;   ? Time 8   ? Period Weeks   ? Status Achieved   ? Target Date 09/07/21   ?  ? PT LONG TERM GOAL #5  ? Title Pt will be able to tolerate standing >/= 3 min without UE support to demonstrate ability to complete grooming/bathing tasks and other standing ADL's without LOB.   ? Baseline 09/11/21: 1 min 45 sec   ? Time 8   ? Period Weeks   ? Status New   ? Target Date 11/06/21   ? ?  ?  ? ?  ? ? ? ? ? ? ? ? Plan - 09/20/21 1524   ? ? Clinical Impression Statement Pt participated well within PT session this date.  Patient continued with core balance gains this session overall responded well  to them initiating recruitment of both hip and ankle muscle activation to improve his proprioception and balance.  Patient also making progress with ambulation was able to complete several high-level amb

## 2021-09-21 ENCOUNTER — Encounter: Payer: Self-pay | Admitting: Occupational Therapy

## 2021-09-21 NOTE — Therapy (Signed)
[facility] ?[facility] ?[place], [place], [address] ?Phone: [phone]   Fax:  [phone] ? ?Occupational Therapy Treatment ? ?Patient Details  ?Name: Jerry Wheeler ?MRN: [id number] ?Date of Birth: 28-Dec-1975 ?Referring Provider (OT): Reesa Chew, [place] ? ? ?Encounter Date: 09/20/2021 ? ? OT End of Session - 09/21/21 0843   ? ? Visit Number 16   ? Number of Visits 34   ? Date for OT Re-Evaluation 11/12/21   ? Authorization Time Period reporting period beginning 08/21/21   ? OT Start Time 1100   ? OT Stop Time 1145   ? OT Time Calculation (min) 45 min   ? Activity Tolerance Patient tolerated treatment well   ? Behavior During Therapy [facility] for tasks assessed/performed   ? ?  ?  ? ?  ? ? ?Past Medical History:  ?Diagnosis Date  ? Anxiety disorder   ? Bipolar disorder ([place])   ? Diabetes mellitus ([place])   ? Diabetic peripheral neuropathy associated with type 2 diabetes mellitus ([place])   ? Emphysema lung ([place])   ? hospitalization 1999  ? High blood pressure   ? Major depressive disorder in partial remission ([place])   ? Morbid obesity ([place])   ? Osteomyelitis of great toe of left foot ([place])   ? Sleep apnea   ? Tobacco use disorder, continuous   ? Ulcer of right heel ([place])   ? followed by podiatry--fully healed 09/2020  ? ? ?Past Surgical History:  ?Procedure Laterality Date  ? DG GREAT TOE LEFT FOOT  2010  ? s/p I and D for osteo  ? ? ?There were no vitals filed for this visit. ? ?OT TREATMENT   ? ?Therapeutic Ex.: ? ?Pt. worked on functional reaching in low planes at the mat while standing unsupported. Pt. Worked on reaching to the ipsilateral, and contralateral sides, as well as crossing midline.  Pt. Required multiple rest breaks. Pt. Worked on unsupported standing balance while writing and a vertical whiteboard. Pt. Worked on copying long sentences, and activities reaching to the far left, right, and crossing midline to the far left, and right.  ? ?Pt. reports not  feeling as great, and fatigued today. Pt. Presented with shakiness when attempting to reach low to cones one the mat. Pt. Functional reaching tasks were performed to work on the skills needed in preparation for low reaching during ADLs, and IADL tasks at home. Pt. Required multiple rest breaks during all standing tasks. Pt. continues to work on improving activity tolerance, dynamic standing balance, functional reaching tasks for improved independence during ADLs, and IADL tasks. ?  ? ? ? ? ? ? ? ? ? ? ? ? ? ? ? ? ? ? ? ? ? ? OT Education - 09/21/21 0843   ? ? Education Details functional reaching   ? Person(s) Educated Patient   ? Methods Explanation;Verbal cues;Demonstration;Tactile cues   ? Comprehension Verbalized understanding;Verbal cues required;Returned demonstration;Need further instruction   ? ?  ?  ? ?  ? ? ? OT Short Term Goals - 08/21/21 0951   ? ?  ? OT SHORT TERM GOAL #1  ? Title Pt will verbalize and demonstrate 3 fall prevention strategies to reduce fall risk with ADLs/IADLs.   ? Baseline Eval: not yet initiated; 08/21/21: identifies 3 strategies with min vc   ? Time 6   ? Period Weeks   ? Status Partially Met   ? Target Date 10/02/21   ?  ?  OT SHORT TERM GOAL #2  ? Title Pt will verbalize 3 compensatory strategies to manage BUE tremors during ADL completion.   ? Baseline Eval: education needed; 08/21/21: Able to identify 2 strategies with min vc   ? Time 6   ? Period Weeks   ? Status Partially Met   ? Target Date 10/02/21   ? ?  ?  ? ?  ? ? ? ? OT Long Term Goals - 08/21/21 0953   ? ?  ? OT LONG TERM GOAL #1  ? Title Pt will increase FOTO score to 60 or better to indicate increased functional performance.   ? Baseline Eval: FOTO 51; 08/21/21: 48.4   ? Time 12   ? Period Weeks   ? Status On-going   ? Target Date 11/12/21   ?  ? OT LONG TERM GOAL #2  ? Title Pt will increase functional reach to >12" without UE support to decrease fall risk with functional reaching during daily tasks.   ? Baseline Eval:  Functional reach 9" without AD, 13" with 1 hand on support surface; 08/21/21: 12" without AD, 14" with 1 hand on support surface   ? Time 12   ? Period Weeks   ? Status Revised   ? Target Date 11/12/21   ?  ? OT LONG TERM GOAL #3  ? Title Pt will increase 360 degree turn test score to WNL (4 secs or less) without AD to indicate improved dynamic standing/decreased fall risk within small spaces for kitchen mobility.   ? Baseline Eval: 360 turn test score 7 sec with RW; 08/21/21: 4 sec with RW   ? Time 12   ? Period Weeks   ? Status Revised   ? Target Date 11/12/21   ?  ? OT LONG TERM GOAL #4  ? Title Pt will be modified indep to perform hot meal prep at stove top.   ? Baseline Eval: Dep for hot meal prep, using microwave only and preparing cold meals d/t limited ability to release hands from walker; 08/21/21: Pt managing cold and hot LIGHT meal prep (microwaveable) with modified indep.   ? Time 12   ? Period Weeks   ? Status On-going   ? Target Date 11/12/21   ?  ? OT LONG TERM GOAL #5  ? Title Pt will manage laundry tasks with modified indep.   ? Baseline Eval: max A; brother currently managing pt's laundry; 08/21/21: pt has not yet attempted laundry, but educated today on transport strategies to manage laundry up/down stairs of apartment   ? Time 6   ? Period Weeks   ? Status On-going   ? Target Date 11/12/21   ?  ? OT LONG TERM GOAL #6  ? Title Pt will increase bilat grip strength by 20 or more lbs to improve ability to hold and carry ADL supplies.   ? Baseline Eval: R grip strength 66 lbs, L 68 (~20 below normal range for pt's age and gender); 08/21/21: R grip 79, L grip 80   ? Time 12   ? Period Weeks   ? Status Revised   ? Target Date 11/12/21   ? ?  ?  ? ?  ? ? ? ? ? ? ? ? Plan - 09/21/21 0844   ? ? Clinical Impression Statement Pt. reports not feeling as great, and fatigued today. Pt. Presented with shakiness when attempting to reach low to cones one the mat. Pt. Functional reaching tasks were  performed to work on the  skills needed in preparation for low reaching during ADLs, and IADL tasks at home. Pt. Required multiple rest breaks during all standing tasks. Pt. continues to work on improving activity tolerance, dynamic standing balance, functional reaching tasks for improved independence during ADLs, and IADL tasks. ?   ? OT Occupational Profile and History Problem Focused Assessment - Including review of records relating to presenting problem   ? Occupational performance deficits (Please refer to evaluation for details): ADL's;IADL's   ? Body Structure / Function / Physical Skills ADL;Coordination;Endurance;GMC;UE functional use;Balance;Sensation;Body mechanics;Decreased knowledge of use of DME;IADL;Pain;Dexterity;FMC;Strength;Gait;Mobility   ? Rehab Potential Good   ? Clinical Decision Making Limited treatment options, no task modification necessary   ? Comorbidities Affecting Occupational Performance: May have comorbidities impacting occupational performance   ? Modification or Assistance to Complete Evaluation  No modification of tasks or assist necessary to complete eval   ? OT Duration 12 weeks   ? OT Treatment/Interventions Self-care/ADL training;Therapeutic exercise;DME and/or AE instruction;Functional Mobility Training;Balance training;Energy conservation;Therapeutic activities;Patient/family education   ? Consulted and Agree with Plan of Care Patient   ? ?  ?  ? ?  ? ? ?Patient will benefit from skilled therapeutic intervention in order to improve the following deficits and impairments:   ?Body Structure / Function / Physical Skills: ADL, Coordination, Endurance, GMC, UE functional use, Balance, Sensation, Body mechanics, Decreased knowledge of use of DME, IADL, Pain, Dexterity, FMC, Strength, Gait, Mobility ?  ?  ? ? ?Visit Diagnosis: ?Muscle weakness (generalized) ? ?Other lack of coordination ? ? ? ?Problem List ?Patient Active Problem List  ? Diagnosis Date Noted  ? Lumbar disc disorder with myelopathy 06/01/2021   ? Unsteady gait 05/27/2021  ? Fall at home, initial encounter 05/27/2021  ? Lower extremity weakness 05/26/2021  ? Obesity (BMI 35.0-39.9 without comorbidity) 05/26/2021  ? Anxiety 09/12/2017  ? Bipolar diso

## 2021-09-25 ENCOUNTER — Ambulatory Visit: Payer: Medicare Other | Admitting: Occupational Therapy

## 2021-09-25 ENCOUNTER — Encounter: Payer: Self-pay | Admitting: Occupational Therapy

## 2021-09-25 ENCOUNTER — Ambulatory Visit: Payer: Medicare Other

## 2021-09-25 DIAGNOSIS — R262 Difficulty in walking, not elsewhere classified: Secondary | ICD-10-CM

## 2021-09-25 DIAGNOSIS — M6281 Muscle weakness (generalized): Secondary | ICD-10-CM | POA: Diagnosis not present

## 2021-09-25 DIAGNOSIS — R278 Other lack of coordination: Secondary | ICD-10-CM

## 2021-09-25 DIAGNOSIS — R2681 Unsteadiness on feet: Secondary | ICD-10-CM

## 2021-09-25 DIAGNOSIS — R2689 Other abnormalities of gait and mobility: Secondary | ICD-10-CM

## 2021-09-25 DIAGNOSIS — M5106 Intervertebral disc disorders with myelopathy, lumbar region: Secondary | ICD-10-CM

## 2021-09-25 NOTE — Therapy (Signed)
[facility] ?[place], [place], [address] ?Phone: [phone]   Fax:  [phone] ? ?Physical Therapy Treatment ? ?Patient Details  ?Name: Jerry Wheeler ?MRN: [id number] ?Date of Birth: [birth year] ?Referring Provider (PT): Reesa Chew PA/ Netty Starring PCP ? ? ?Encounter Date: 09/25/2021 ? ? PT End of Session - 09/25/21 0857   ? ? Visit Number 17   ? Number of Visits 23   ? Date for PT Re-Evaluation 11/06/21   ? Authorization Type medicare   ? PT Start Time 0815   ? PT Stop Time 0844   ? PT Time Calculation (min) 29 min   ? Equipment Utilized During Treatment Gait belt   ? Activity Tolerance Patient tolerated treatment well   ? Behavior During Therapy [facility] for tasks assessed/performed   ? ?  ?  ? ?  ? ? ?Past Medical History:  ?Diagnosis Date  ? Anxiety disorder   ? Bipolar disorder ([place])   ? Diabetes mellitus ([place])   ? Diabetic peripheral neuropathy associated with type 2 diabetes mellitus ([place])   ? Emphysema lung ([place])   ? hospitalization 1999  ? High blood pressure   ? Major depressive disorder in partial remission ([place])   ? Morbid obesity ([place])   ? Osteomyelitis of great toe of left foot ([place])   ? Sleep apnea   ? Tobacco use disorder, continuous   ? Ulcer of right heel ([place])   ? followed by podiatry--fully healed 09/2020  ? ? ?Past Surgical History:  ?Procedure Laterality Date  ? DG GREAT TOE LEFT FOOT  2010  ? s/p I and D for osteo  ? ? ?There were no vitals filed for this visit. ? ? Subjective Assessment - 09/25/21 0820   ? ? Subjective Pt reports he is feeling better overall- "I think my strength and balance have improved since I started Physical Therapy. I am really pleased with my progress. Stll trying to not use walker much around the house and no falls."   ? Pertinent History 46 yo Male admitted to the hospital 05/26/2021 - 06/01/2021 at [facility] with lower extremity weakness secondary to peripheral neuropathy and severe spinal stenosis seen  on MRI on 05/26/2021. He was discharged to [facility] rehab 06/01/2021 - 06/09/2021. He was discharged to his brother's home. Prior to weakness he was in a 2 story apartment with stairs to enter. Therefore he is currently living at his brothers home which is 2 story but living is on the main floor; he does have 4 steps to enter home with B railings; He reports navigating those steps well. He is currenlty ambulating with RW, able to take a few steps without walker. Reports falling on 12/9 prior to admittance but otherwise no new falls. He denies any significant numbness/tingling; He does have neuropathy in the feet from diabetes but he doesn't feel that it has worsened. He does have an appointment with Dr. Lacinda Axon (neurosurgeon); He is not sure that they will recommend surgery due to uncontrolled diabetes and concern that surgery may not improve condition;   ? How long can you sit comfortably? several hours   ? How long can you stand comfortably? Unsure- able to stand at least 5 min but hasn't tested it.   ? How long can you walk comfortably? about 500 feet with fatigue, does use RW   ? Diagnostic tests MRI on 05/26/21 IMPRESSION:  1. Multilevel lumbar spondylosis, as detailed above.  2. Severe canal  stenosis at L3-L4.  3. Moderate canal stenosis at L2-L3.  4. Mild-to-moderate canal stenosis with moderate-severe left  foraminal stenosis at L4-L5.   ? Patient Stated Goals "get back into my apartment- negotiate steps, walk without walker"   ? Currently in Pain? No/denies   ? Pain Onset --   ? ?  ?  ? ?  ? ? ? ?INTERVENTIONS:  ? ?Nustep (INTERVAL Training) :  ?L1= 42mn ?L4= 1 min ?L1= 1 min ?L5= 152m ?L1= 1 min ?L6= 1 min ?L1 = 48m58m?Total time= 8mi83mHR= 110 bpm ?O2 sat= 97% ?Modified RPE= 4-5/10 ? ?Mini lunge squat onto BOSU ball in // bars x 12 reps each LE. VC to perform slow and controlled. *patient initially used 2 UE support for 1st couple of reps, then 1 UE support for next 3-4, then switched to attempt no UE  support- Increased unsteadiness with intermittent UE reaching for bars.  ? ?Resistive gait- Forward at 12.5 lb x 5 trips- CGA for safety- Patient with more difficulty walking backward- very short reciprocal steps and increased unsteadiness. He did improve his balance/steadiness with each trial.  ? ?Sit to stand from high to progressive lower surface from mat table with no UE support:  ? ?-27 in= 4 reps ?-26 in= 3 reps ?-25 in = 3 reps  ?-24 in= 3 reps ?-23 in= 4 reps ? ?Patient fatigued yet able to complete. ? ?Education provided throughout session via VC/TC and demonstration to facilitate movement at target joints and correct muscle activation for all testing and exercises performed.  ? ? ? ? ? ? ? ? ? ? ? ? ? ? ? ? ? ? ? ? ? ? ? PT Education - 09/25/21 0907   ? ? Education Details Exercise technique   ? Person(s) Educated Patient   ? Methods Explanation;Demonstration;Tactile cues;Verbal cues   ? Comprehension Verbalized understanding;Returned demonstration;Verbal cues required;Tactile cues required;Need further instruction   ? ?  ?  ? ?  ? ? ? PT Short Term Goals - 09/11/21 0815   ? ?  ? PT SHORT TERM GOAL #1  ? Title Patient will be adherent to HEP at least 3x a week to improve functional strength and balance for better safety at home.   ? Baseline 2/27: reports doing them 3-4x a week;   ? Time 4   ? Period Weeks   ? Status Achieved   ? Target Date 08/10/21   ?  ? PT SHORT TERM GOAL #2  ? Title Patient will demonstrate improvement in foot/ankle strength by being able to complete BLE heel raise with minimal UE support for better gait safety and mobility;   ? Baseline 2/27: unable; 09/11/21: unable   ? Time 4   ? Period Weeks   ? Status Not Met   ? Target Date 10/09/21   ? ?  ?  ? ?  ? ? ? ? PT Long Term Goals - 09/11/21 0822   ? ?  ? PT LONG TERM GOAL #1  ? Title Patient will increase BLE gross strength to 4+/5  especially in hip as to improve functional strength for independent gait, increased standing tolerance  and increased ADL ability.   ? Baseline 2/27: see flowsheet; 09/11/21: see flowsheet, remains limited significantly in ankle strength   ? Time 8   ? Period Weeks   ? Status Partially Met   ? Target Date 11/06/21   ?  ? PT LONG TERM GOAL #2  ?  Title Patient will increase six minute walk test distance to >800 for progression toward community ambulator and improve gait ability   ? Baseline 2/27: 850 feet with RW   ? Time 8   ? Period Weeks   ? Status Achieved   ? Target Date 09/07/21   ?  ? PT LONG TERM GOAL #3  ? Title Patient will demonstrate an improved Berg Balance Score of > 42/56 as to demonstrate improved balance with ADLs such as sitting/standing and transfer balance and reduced fall risk.   ? Baseline 2/27: 25/56; 09/11/21: 37/56   ? Time 8   ? Period Weeks   ? Status On-going   ? Target Date 11/06/21   ?  ? PT LONG TERM GOAL #4  ? Title Patient will reduce timed up and go to <14 seconds to reduce fall risk and demonstrate improved transfer/gait ability.   ? Baseline 2/27: 9.48 sec with RW;   ? Time 8   ? Period Weeks   ? Status Achieved   ? Target Date 09/07/21   ?  ? PT LONG TERM GOAL #5  ? Title Pt will be able to tolerate standing >/= 3 min without UE support to demonstrate ability to complete grooming/bathing tasks and other standing ADL's without LOB.   ? Baseline 09/11/21: 1 min 45 sec   ? Time 8   ? Period Weeks   ? Status New   ? Target Date 11/06/21   ? ?  ?  ? ?  ? ? ? ? ? ? ? ? Plan - 09/25/21 0858   ? ? Clinical Impression Statement Patient arrived late yet with good motivation for today's session. He was able to participate well with all balance, functional endurance and LE strengthening activities well. He was challenged with resistive gait and attempting to perform mini lunges due to sensory loss with feet yet did improve with practice today. He reported overall fatigue from session yet able to complete all therex with minimal rest breaks. He will  benefit from continued training walking/balance  without rollator.Patient will continue to benefit from skilled physical therapy services in order to improve his strength, balance, and mobility.   ? Personal Factors and Comorbidities Comorbidity 3+;Fitness;Ti

## 2021-09-25 NOTE — Therapy (Addendum)
?[facility] ?[place], [place], [address] ?Phone: [phone]   Fax:  [phone] ? ?Occupational Therapy Treatment ? ?Patient Details  ?Name: Jerry Wheeler ?MRN: [id number] ?Date of Birth: Jun 09, 1976 ?Referring Provider (OT): Reesa Chew, [place] ? ? ?Encounter Date: 09/25/2021 ? ? OT End of Session - 09/25/21 0907   ? ? Visit Number 17   ? Number of Visits 34   ? Date for OT Re-Evaluation 11/12/21   ? Authorization Time Period reporting period beginning 08/21/21   ? OT Start Time 0845   ? OT Stop Time 0930   ? OT Time Calculation (min) 45 min   ? Activity Tolerance Patient tolerated treatment well   ? Behavior During Therapy [facility] for tasks assessed/performed   ? ?  ?  ? ?  ? ? ?Past Medical History:  ?Diagnosis Date  ? Anxiety disorder   ? Bipolar disorder ([place])   ? Diabetes mellitus ([place])   ? Diabetic peripheral neuropathy associated with type 2 diabetes mellitus ([place])   ? Emphysema lung ([place])   ? hospitalization 1999  ? High blood pressure   ? Major depressive disorder in partial remission ([place])   ? Morbid obesity ([place])   ? Osteomyelitis of great toe of left foot ([place])   ? Sleep apnea   ? Tobacco use disorder, continuous   ? Ulcer of right heel ([place])   ? followed by podiatry--fully healed 09/2020  ? ? ?Past Surgical History:  ?Procedure Laterality Date  ? DG GREAT TOE LEFT FOOT  2010  ? s/p I and D for osteo  ? ? ?There were no vitals filed for this visit. ? ? Subjective Assessment - 09/25/21 0906   ? ? Subjective  Pt. reports that he did laundry this weekens using a bag to carry the laundry.   ? Patient is accompanied by: Family member   ? Pertinent History poorly controlled T2DM with peripheral neuropathy, bipolar disorder, OSA who was admitted to [facility] on 05/26/21 with worsening of BLE weakness and  numbness for 3 weeks progressing to inability to walk x one day. Neurology consulted and MRI spine done revealing severe canal stenosis L3/L4,  moderate canal stenosis L2/L3 and mild to moderate canal stenosis with  moderate to severe left foraminal stenosis at L4/L5.   ? Currently in Pain? No/denies   ? ?  ?  ? ?  ? ?  ?OT TREATMENT   ?  ?Pt. performed 3# dowel ex. For UE strengthening secondary to weakness. Bilateral shoulder flexion, chest press, circular patterns, horizontal "V" patterns, and elbow flexion/extension were performed. 4# dumbbell ex. for shoulder flexion, elbow flexion, and extension, forearm supination/pronation, wrist flexion/extension, and radial deviation. Pt. requires rest breaks and verbal cues for proper technique. Pt. worked on unsupported standing balance while writing and a vertical whiteboard, as well as activities reaching to the far left, right, and crossing midline to the far left, and right. Pt. Worked on these tasks to improve strength, endurance for standing activity tolerance during ADLS, and IADLs. ?  ?Pt. reports that he is feeling better today, however is fatigued from not eating breakfast this morning, as he has to have fasting Lab work after therapy.  Pt. Functional reaching, and dynamic standing tasks were performed to work on the skills needed in preparation for low reaching during ADLs, and IADL tasks at home. Pt. Worked on UE there. Ex. To improve strength needed for improved UE functioning during ADLs, and  IADLs. Pt. required multiple rest breaks during all standing tasks. Pt. continues to work on improving activity tolerance, dynamic standing balance, UE there. Ex., and functional reaching tasks for improved independence during ADLs, and IADL tasks. ? ?  ? ? ? ? ? ? ? ? ? ? ? ? ? ? ? ? ? ? ? ? ? OT Education - 09/25/21 0907   ? ? Education Details functional reaching   ? Person(s) Educated Patient   ? Methods Explanation;Verbal cues;Demonstration;Tactile cues   ? Comprehension Verbalized understanding;Verbal cues required;Returned demonstration;Need further instruction   ? ?  ?  ? ?  ? ? ? OT Short Term Goals -  08/21/21 0951   ? ?  ? OT SHORT TERM GOAL #1  ? Title Pt will verbalize and demonstrate 3 fall prevention strategies to reduce fall risk with ADLs/IADLs.   ? Baseline Eval: not yet initiated; 08/21/21: identifies 3 strategies with min vc   ? Time 6   ? Period Weeks   ? Status Partially Met   ? Target Date 10/02/21   ?  ? OT SHORT TERM GOAL #2  ? Title Pt will verbalize 3 compensatory strategies to manage BUE tremors during ADL completion.   ? Baseline Eval: education needed; 08/21/21: Able to identify 2 strategies with min vc   ? Time 6   ? Period Weeks   ? Status Partially Met   ? Target Date 10/02/21   ? ?  ?  ? ?  ? ? ? ? OT Long Term Goals - 08/21/21 0953   ? ?  ? OT LONG TERM GOAL #1  ? Title Pt will increase FOTO score to 60 or better to indicate increased functional performance.   ? Baseline Eval: FOTO 51; 08/21/21: 48.4   ? Time 12   ? Period Weeks   ? Status On-going   ? Target Date 11/12/21   ?  ? OT LONG TERM GOAL #2  ? Title Pt will increase functional reach to >12" without UE support to decrease fall risk with functional reaching during daily tasks.   ? Baseline Eval: Functional reach 9" without AD, 13" with 1 hand on support surface; 08/21/21: 12" without AD, 14" with 1 hand on support surface   ? Time 12   ? Period Weeks   ? Status Revised   ? Target Date 11/12/21   ?  ? OT LONG TERM GOAL #3  ? Title Pt will increase 360 degree turn test score to WNL (4 secs or less) without AD to indicate improved dynamic standing/decreased fall risk within small spaces for kitchen mobility.   ? Baseline Eval: 360 turn test score 7 sec with RW; 08/21/21: 4 sec with RW   ? Time 12   ? Period Weeks   ? Status Revised   ? Target Date 11/12/21   ?  ? OT LONG TERM GOAL #4  ? Title Pt will be modified indep to perform hot meal prep at stove top.   ? Baseline Eval: Dep for hot meal prep, using microwave only and preparing cold meals d/t limited ability to release hands from walker; 08/21/21: Pt managing cold and hot LIGHT meal prep  (microwaveable) with modified indep.   ? Time 12   ? Period Weeks   ? Status On-going   ? Target Date 11/12/21   ?  ? OT LONG TERM GOAL #5  ? Title Pt will manage laundry tasks with modified indep.   ?  Baseline Eval: max A; brother currently managing pt's laundry; 08/21/21: pt has not yet attempted laundry, but educated today on transport strategies to manage laundry up/down stairs of apartment   ? Time 6   ? Period Weeks   ? Status On-going   ? Target Date 11/12/21   ?  ? OT LONG TERM GOAL #6  ? Title Pt will increase bilat grip strength by 20 or more lbs to improve ability to hold and carry ADL supplies.   ? Baseline Eval: R grip strength 66 lbs, L 68 (~20 below normal range for pt's age and gender); 08/21/21: R grip 79, L grip 80   ? Time 12   ? Period Weeks   ? Status Revised   ? Target Date 11/12/21   ? ?  ?  ? ?  ? ? ? ? ? ? ? ? Plan - 09/25/21 0908   ? ? Clinical Impression Statement Pt. reports that he is feeling better today, however is fatigued from not eating breakfast this morning, as he has to have fasting Lab work after therapy.  Pt. Functional reaching, and dynamic standing tasks were performed to work on the skills needed in preparation for low reaching during ADLs, and IADL tasks at home. Pt. Worked on UE there. Ex. To improve strength needed for improved UE functioning during ADLs, and IADLs. Pt. required multiple rest breaks during all standing tasks. Pt. continues to work on improving activity tolerance, dynamic standing balance, UE there. Ex., and functional reaching tasks for improved independence during ADLs, and IADL tasks. ? ?   ? OT Occupational Profile and History Problem Focused Assessment - Including review of records relating to presenting problem   ? Occupational performance deficits (Please refer to evaluation for details): ADL's;IADL's   ? Body Structure / Function / Physical Skills ADL;Coordination;Endurance;GMC;UE functional use;Balance;Sensation;Body mechanics;Decreased knowledge of  use of DME;IADL;Pain;Dexterity;FMC;Strength;Gait;Mobility   ? Rehab Potential Good   ? Clinical Decision Making Limited treatment options, no task modification necessary   ? Comorbidities Affecting Occupational

## 2021-09-27 ENCOUNTER — Ambulatory Visit: Payer: Medicare Other

## 2021-09-27 DIAGNOSIS — R262 Difficulty in walking, not elsewhere classified: Secondary | ICD-10-CM

## 2021-09-27 DIAGNOSIS — M6281 Muscle weakness (generalized): Secondary | ICD-10-CM

## 2021-09-27 DIAGNOSIS — R2681 Unsteadiness on feet: Secondary | ICD-10-CM

## 2021-09-27 DIAGNOSIS — R2689 Other abnormalities of gait and mobility: Secondary | ICD-10-CM

## 2021-09-27 DIAGNOSIS — M5106 Intervertebral disc disorders with myelopathy, lumbar region: Secondary | ICD-10-CM

## 2021-09-27 NOTE — Therapy (Addendum)
[facility] MAIN [facility] SERVICES [address] [place], [place], [address] Phone: [phone]   Fax:  [phone]  Occupational Therapy Treatment  Patient Details  Name: Jerry Wheeler MRN: [id number] Date of Birth: 01/22/1976 Referring Provider (OT): Delle Reining, [place]   Encounter Date: 09/27/2021   OT End of Session - 09/27/21 1710     Visit Number 18    Number of Visits 34    Date for OT Re-Evaluation 11/12/21    Authorization Time Period reporting period beginning 08/21/21    OT Start Time 0845    OT Stop Time 0930    OT Time Calculation (min) 45 min    Equipment Utilized During Treatment RW    Activity Tolerance Patient tolerated treatment well    Behavior During Therapy WFL for tasks assessed/performed             Past Medical History:  Diagnosis Date   Anxiety disorder    Bipolar disorder (HCC)    Diabetes mellitus (HCC)    Diabetic peripheral neuropathy associated with type 2 diabetes mellitus (HCC)    Emphysema lung (HCC)    hospitalization 1999   High blood pressure    Major depressive disorder in partial remission (HCC)    Morbid obesity (HCC)    Osteomyelitis of great toe of left foot (HCC)    Sleep apnea    Tobacco use disorder, continuous    Ulcer of right heel (HCC)    followed by podiatry--fully healed 09/2020    Past Surgical History:  Procedure Laterality Date   DG GREAT TOE LEFT FOOT  2010   s/p I and D for osteo    There were no vitals filed for this visit.   Subjective Assessment - 09/27/21 1706     Subjective  Pt reports community mobility continues to be tiring.    Patient is accompanied by: Family member    Pertinent History poorly controlled T2DM with peripheral neuropathy, bipolar disorder, OSA who was admitted to [facility] on 05/26/21 with worsening of BLE weakness and  numbness for 3 weeks progressing to inability to walk x one day. Neurology consulted and MRI spine done revealing severe canal  stenosis L3/L4, moderate canal stenosis L2/L3 and mild to moderate canal stenosis with  moderate to severe left foraminal stenosis at L4/L5.    Limitations Muscle weakness, mild BUE tremors, impaired balance and gait, hx of falling, neuropathy in BLEs    Patient Stated Goals "I want to improve my balance so I can safely be in my kitchen to cook."    Currently in Pain? No/denies    Pain Score 0-No pain    Pain Onset More than a month ago            Occupational Therapy Treatment: Therapeutic Exercise: Pt participated in UB therapeutic exercises, working to increase activity tolerance for daily tasks.  Pt completed 3# dowel exercises for chest press, shoulder flex, shoulder abd, circular rotations forward and reverse x2 sets 10 reps each, rest between each set.  Performed bicep curls and tricep extension over head bilat x2 sets 10 reps each.  In standing, completed 5 min UBE at moderate resistance, took seated rest break x3 min and continued another 3 min for seated UBE same setting.    Response to Treatment: VS monitoring throughout therapeutic exercises, with HR 108 at it's lowest when resting, up to 114 with seated and standing therapeutic exercise.  BP elevated at 144/96 following first set of  dowel exercises, but decreased to 138/81 with several min of rest.  OT reinforced importance of rest between sets for lowering HR and BP to more normal range.  Pt rates RPE at 4-6 for community outings such as getting to a doctor appointment.  Pt continues to limit activity outside the house d/t fear of stairs and decreased activity tolerance.  Pt will continue to benefit from skilled OT for increasing activity tolerance and dynamic standing balance for better tolerance and safety with ADL/IADL/community outings.   OT Education - 09/27/21 1709     Education Details VS monitoring with therapeutic exercises    Person(s) Educated Patient    Methods Explanation;Verbal cues    Comprehension Verbalized  understanding              OT Short Term Goals - 08/21/21 0951       OT SHORT TERM GOAL #1   Title Pt will verbalize and demonstrate 3 fall prevention strategies to reduce fall risk with ADLs/IADLs.    Baseline Eval: not yet initiated; 08/21/21: identifies 3 strategies with min vc    Time 6    Period Weeks    Status Partially Met    Target Date 10/02/21      OT SHORT TERM GOAL #2   Title Pt will verbalize 3 compensatory strategies to manage BUE tremors during ADL completion.    Baseline Eval: education needed; 08/21/21: Able to identify 2 strategies with min vc    Time 6    Period Weeks    Status Partially Met    Target Date 10/02/21               OT Long Term Goals - 08/21/21 0953       OT LONG TERM GOAL #1   Title Pt will increase FOTO score to 60 or better to indicate increased functional performance.    Baseline Eval: FOTO 51; 08/21/21: 48.4    Time 12    Period Weeks    Status On-going    Target Date 11/12/21      OT LONG TERM GOAL #2   Title Pt will increase functional reach to >12" without UE support to decrease fall risk with functional reaching during daily tasks.    Baseline Eval: Functional reach 9" without AD, 13" with 1 hand on support surface; 08/21/21: 12" without AD, 14" with 1 hand on support surface    Time 12    Period Weeks    Status Revised    Target Date 11/12/21      OT LONG TERM GOAL #3   Title Pt will increase 360 degree turn test score to WNL (4 secs or less) without AD to indicate improved dynamic standing/decreased fall risk within small spaces for kitchen mobility.    Baseline Eval: 360 turn test score 7 sec with RW; 08/21/21: 4 sec with RW    Time 12    Period Weeks    Status Revised    Target Date 11/12/21      OT LONG TERM GOAL #4   Title Pt will be modified indep to perform hot meal prep at stove top.    Baseline Eval: Dep for hot meal prep, using microwave only and preparing cold meals d/t limited ability to release hands from  walker; 08/21/21: Pt managing cold and hot LIGHT meal prep (microwaveable) with modified indep.    Time 12    Period Weeks    Status On-going    Target Date  11/12/21      OT LONG TERM GOAL #5   Title Pt will manage laundry tasks with modified indep.    Baseline Eval: max A; brother currently managing pt's laundry; 08/21/21: pt has not yet attempted laundry, but educated today on transport strategies to manage laundry up/down stairs of apartment    Time 6    Period Weeks    Status On-going    Target Date 11/12/21      OT LONG TERM GOAL #6   Title Pt will increase bilat grip strength by 20 or more lbs to improve ability to hold and carry ADL supplies.    Baseline Eval: R grip strength 66 lbs, L 68 (~20 below normal range for pt's age and gender); 08/21/21: R grip 79, L grip 80    Time 12    Period Weeks    Status Revised    Target Date 11/12/21              Plan - 09/27/21 1719     Clinical Impression Statement VS monitoring throughout therapeutic exercises, with HR 108 at it's lowest when resting, up to 114 with seated and standing therapeutic exercise.  BP elevated at 144/96 following first set of dowel exercises, but decreased to 138/81 with several min of rest.  Sp02 remained in the high 90s throughout session.  OT reinforced importance of rest between sets for lowering HR and BP to more normal range.  Pt rates RPE at 4-6 for community outings such as getting to a doctor appointment.  Pt continues to limit activity outside the house d/t fear of stairs and decreased activity tolerance.  Pt will continue to benefit from skilled OT for increasing activity tolerance and dynamic standing balance for better tolerance and safety with ADL/IADL/community outings.    OT Occupational Profile and History Problem Focused Assessment - Including review of records relating to presenting problem    Occupational performance deficits (Please refer to evaluation for details): ADL's;IADL's    Body Structure  / Function / Physical Skills ADL;Coordination;Endurance;GMC;UE functional use;Balance;Sensation;Body mechanics;Decreased knowledge of use of DME;IADL;Pain;Dexterity;FMC;Strength;Gait;Mobility    Rehab Potential Good    Clinical Decision Making Limited treatment options, no task modification necessary    Comorbidities Affecting Occupational Performance: May have comorbidities impacting occupational performance    Modification or Assistance to Complete Evaluation  No modification of tasks or assist necessary to complete eval    OT Duration 12 weeks    OT Treatment/Interventions Self-care/ADL training;Therapeutic exercise;DME and/or AE instruction;Functional Mobility Training;Balance training;Energy conservation;Therapeutic activities;Patient/family education    Consulted and Agree with Plan of Care Patient             Patient will benefit from skilled therapeutic intervention in order to improve the following deficits and impairments:   Body Structure / Function / Physical Skills: ADL, Coordination, Endurance, GMC, UE functional use, Balance, Sensation, Body mechanics, Decreased knowledge of use of DME, IADL, Pain, Dexterity, FMC, Strength, Gait, Mobility       Visit Diagnosis: Muscle weakness (generalized)  Lumbar disc disorder with myelopathy    Problem List Patient Active Problem List   Diagnosis Date Noted   Lumbar disc disorder with myelopathy 06/01/2021   Unsteady gait 05/27/2021   Fall at home, initial encounter 05/27/2021   Lower extremity weakness 05/26/2021   Obesity (BMI 35.0-39.9 without comorbidity) 05/26/2021   Anxiety 09/12/2017   Bipolar disorder (HCC) 09/12/2017   Insulin dependent diabetes mellitus 09/12/2017   Primary insomnia 09/12/2017   Pure hypercholesterolemia  09/12/2017   Recurrent major depressive disorder, in partial remission (HCC) 09/12/2017   Tobacco dependence 09/12/2017   Vitamin D deficiency 09/12/2017   DM type 2 with diabetic peripheral  neuropathy (HCC) 04/06/2016   Hypercalcemia 09/13/2015   Hypogonadism in male 09/13/2015   Non morbid obesity due to excess calories 09/13/2015   Danelle Earthly, MS, OTR/L  Otis Dials, OT 09/27/2021, 5:21 PM   [facility] MAIN [facility] SERVICES [address] [place], [place], [address] Phone: [phone]   Fax:  [phone]  Name: Absalon Mannis MRN: [id number] Date of Birth: [birth year]

## 2021-09-27 NOTE — Therapy (Signed)
[facility] MAIN REHAB SERVICES ?[place], [place], [address] ?Phone: [phone]   Fax:  [phone] ? ?Physical Therapy Treatment ? ?Patient Details  ?Name: Jerry Wheeler ?MRN: [id number] ?Date of Birth: 1976-01-03 ?Referring Provider (PT): Reesa Chew PA/ Netty Starring PCP ? ? ?Encounter Date: 09/27/2021 ? ? PT End of Session - 09/27/21 1249   ? ? Visit Number 18   ? Number of Visits 23   ? Date for PT Re-Evaluation 11/06/21   ? Authorization Type medicare   ? PT Start Time 0813   ? PT Stop Time 0845   ? PT Time Calculation (min) 32 min   ? Equipment Utilized During Treatment Gait belt   ? Activity Tolerance Patient tolerated treatment well   ? Behavior During Therapy [facility] for tasks assessed/performed   ? ?  ?  ? ?  ? ? ?Past Medical History:  ?Diagnosis Date  ? Anxiety disorder   ? Bipolar disorder ([place])   ? Diabetes mellitus ([place])   ? Diabetic peripheral neuropathy associated with type 2 diabetes mellitus ([place])   ? Emphysema lung ([place])   ? hospitalization 1999  ? High blood pressure   ? Major depressive disorder in partial remission ([place])   ? Morbid obesity ([place])   ? Osteomyelitis of great toe of left foot ([place])   ? Sleep apnea   ? Tobacco use disorder, continuous   ? Ulcer of right heel ([place])   ? followed by podiatry--fully healed 09/2020  ? ? ?Past Surgical History:  ?Procedure Laterality Date  ? DG GREAT TOE LEFT FOOT  2010  ? s/p I and D for osteo  ? ? ?There were no vitals filed for this visit. ? ? Subjective Assessment - 09/27/21 0837   ? ? Subjective Patient reports he is doing well. No falls or LOB since last session. Reports he is doing well, no new problems.   ? Pertinent History 46 yo Male admitted to the hospital 05/26/2021 - 06/01/2021 at [facility] with lower extremity weakness secondary to peripheral neuropathy and severe spinal stenosis seen on MRI on 05/26/2021. He was discharged to [facility] rehab 06/01/2021 - 06/09/2021. He was discharged to his  brother's home. Prior to weakness he was in a 2 story apartment with stairs to enter. Therefore he is currently living at his brothers home which is 2 story but living is on the main floor; he does have 4 steps to enter home with B railings; He reports navigating those steps well. He is currenlty ambulating with RW, able to take a few steps without walker. Reports falling on 12/9 prior to admittance but otherwise no new falls. He denies any significant numbness/tingling; He does have neuropathy in the feet from diabetes but he doesn't feel that it has worsened. He does have an appointment with Dr. Lacinda Axon (neurosurgeon); He is not sure that they will recommend surgery due to uncontrolled diabetes and concern that surgery may not improve condition;   ? How long can you sit comfortably? several hours   ? How long can you stand comfortably? Unsure- able to stand at least 5 min but hasn't tested it.   ? How long can you walk comfortably? about 500 feet with fatigue, does use RW   ? Diagnostic tests MRI on 05/26/21 IMPRESSION:  1. Multilevel lumbar spondylosis, as detailed above.  2. Severe canal stenosis at L3-L4.  3. Moderate canal stenosis at L2-L3.  4. Mild-to-moderate canal stenosis with moderate-severe left  foraminal stenosis at L4-L5.   ? Patient Stated Goals "get back into my apartment- negotiate steps, walk without walker"   ? Currently in Pain? No/denies   ? ?  ?  ? ?  ? ? ? ? ? ?Neuro Re-ed ? ?Standing with CGA next to support surface:  ?Airex pad: static stand 30 seconds x 2 trials, noticeable trembling of ankles/LE's with fatigue and challenge to maintain stability ?Airex pad: horizontal head turns 30 seconds scanning room 10x ; cueing for arc of motion  ?Airex pad: vertical head turns 30 seconds, cueing for arc of motion, noticeable sway with upward gaze increasing demand on ankle righting reaction musculature ?Airex pad: one foot on 6" step one foot on airex pad, hold position for 30 seconds, switch legs, 2x  each LE; ? ?Airex balance beam:  ?-lateral stepping 6x length of // bars with UE support ?-tandem walking 6x length of // bars with UE support ? ?Weaving around five cones and stepping over half foam roller x 2 no AD.  ? ?Stand in center of cones: weave around cone color PT call out and return to center x 5 cones  ? ?Three hedghog taps with finger tip support x6 each LE ? ? ?TherEx ? ?Leg Press: ?BLE 85# x15 reps, Medium difficulty; ?BLE 100# x10 reps, able to exhibit good control and positioning;  ?BLE heel press 40# x10 reps, able to exhibit good ankle ROM and reports feeling some fatigue in calves;  ? ?STS 5x  ? ?Patient able to get LE on/off machine independent which is improvement; He does require extension of machine for LE length;  ? ?Patient's PT session limited by late arrival however patient remains highly motivated throughout session. He is able to perform increased ambulation and negotiation of obstacles without AD. His LE's fatigue by end of session with increased trembling. His strength is improving as can be seen in leg press increase and heel press.  He will benefit from continued training walking/balance without rollator.Patient will continue to benefit from skilled physical therapy services in order to improve his strength, balance, and mobility ? ? ? ? ? ? ? ? ? ? ? ? ? ? ? ? ? PT Education - 09/27/21 1249   ? ? Education Details exercise technique, body mechanics   ? Person(s) Educated Patient   ? Methods Explanation;Demonstration;Tactile cues;Verbal cues   ? Comprehension Verbalized understanding;Returned demonstration;Verbal cues required;Tactile cues required   ? ?  ?  ? ?  ? ? ? PT Short Term Goals - 09/11/21 0815   ? ?  ? PT SHORT TERM GOAL #1  ? Title Patient will be adherent to HEP at least 3x a week to improve functional strength and balance for better safety at home.   ? Baseline 2/27: reports doing them 3-4x a week;   ? Time 4   ? Period Weeks   ? Status Achieved   ? Target Date  08/10/21   ?  ? PT SHORT TERM GOAL #2  ? Title Patient will demonstrate improvement in foot/ankle strength by being able to complete BLE heel raise with minimal UE support for better gait safety and mobility;   ? Baseline 2/27: unable; 09/11/21: unable   ? Time 4   ? Period Weeks   ? Status Not Met   ? Target Date 10/09/21   ? ?  ?  ? ?  ? ? ? ? PT Long Term Goals - 09/11/21 0822   ? ?  ?  PT LONG TERM GOAL #1  ? Title Patient will increase BLE gross strength to 4+/5  especially in hip as to improve functional strength for independent gait, increased standing tolerance and increased ADL ability.   ? Baseline 2/27: see flowsheet; 09/11/21: see flowsheet, remains limited significantly in ankle strength   ? Time 8   ? Period Weeks   ? Status Partially Met   ? Target Date 11/06/21   ?  ? PT LONG TERM GOAL #2  ? Title Patient will increase six minute walk test distance to >800 for progression toward community ambulator and improve gait ability   ? Baseline 2/27: 850 feet with RW   ? Time 8   ? Period Weeks   ? Status Achieved   ? Target Date 09/07/21   ?  ? PT LONG TERM GOAL #3  ? Title Patient will demonstrate an improved Berg Balance Score of > 42/56 as to demonstrate improved balance with ADLs such as sitting/standing and transfer balance and reduced fall risk.   ? Baseline 2/27: 25/56; 09/11/21: 37/56   ? Time 8   ? Period Weeks   ? Status On-going   ? Target Date 11/06/21   ?  ? PT LONG TERM GOAL #4  ? Title Patient will reduce timed up and go to <14 seconds to reduce fall risk and demonstrate improved transfer/gait ability.   ? Baseline 2/27: 9.48 sec with RW;   ? Time 8   ? Period Weeks   ? Status Achieved   ? Target Date 09/07/21   ?  ? PT LONG TERM GOAL #5  ? Title Pt will be able to tolerate standing >/= 3 min without UE support to demonstrate ability to complete grooming/bathing tasks and other standing ADL's without LOB.   ? Baseline 09/11/21: 1 min 45 sec   ? Time 8   ? Period Weeks   ? Status New   ? Target Date  11/06/21   ? ?  ?  ? ?  ? ? ? ? ? ? ? ? Plan - 09/27/21 1300   ? ? Clinical Impression Statement Patient's PT session limited by late arrival however patient remains highly motivated throughout session. He is abl

## 2021-10-02 ENCOUNTER — Ambulatory Visit: Payer: Medicare Other

## 2021-10-02 ENCOUNTER — Ambulatory Visit: Payer: Medicare Other | Admitting: Occupational Therapy

## 2021-10-04 ENCOUNTER — Ambulatory Visit: Payer: Medicare Other

## 2021-10-04 ENCOUNTER — Ambulatory Visit: Payer: Medicare Other | Admitting: Physical Therapy

## 2021-10-04 DIAGNOSIS — M5106 Intervertebral disc disorders with myelopathy, lumbar region: Secondary | ICD-10-CM

## 2021-10-04 DIAGNOSIS — M6281 Muscle weakness (generalized): Secondary | ICD-10-CM | POA: Diagnosis not present

## 2021-10-04 DIAGNOSIS — R2681 Unsteadiness on feet: Secondary | ICD-10-CM

## 2021-10-04 DIAGNOSIS — R2689 Other abnormalities of gait and mobility: Secondary | ICD-10-CM

## 2021-10-04 DIAGNOSIS — R262 Difficulty in walking, not elsewhere classified: Secondary | ICD-10-CM

## 2021-10-04 NOTE — Therapy (Signed)
[facility] ?[place], [place], [address] ?Phone: [phone]   Fax:  [phone] ? ?Physical Therapy Treatment ? ?Patient Details  ?Name: Jerry Wheeler ?MRN: [id number] ?Date of Birth: [birth year] ?Referring Provider (PT): Reesa Chew PA/ Netty Starring PCP ? ? ?Encounter Date: 10/04/2021 ? ? PT End of Session - 10/04/21 1151   ? ? Visit Number 19   ? Number of Visits 23   ? Date for PT Re-Evaluation 11/06/21   ? Authorization Type medicare   ? PT Start Time 1149   ? PT Stop Time 1228   ? PT Time Calculation (min) 39 min   ? Equipment Utilized During Treatment Gait belt   ? Activity Tolerance Patient tolerated treatment well   ? Behavior During Therapy [facility] for tasks assessed/performed   ? ?  ?  ? ?  ? ? ?Past Medical History:  ?Diagnosis Date  ? Anxiety disorder   ? Bipolar disorder ([place])   ? Diabetes mellitus ([place])   ? Diabetic peripheral neuropathy associated with type 2 diabetes mellitus ([place])   ? Emphysema lung ([place])   ? hospitalization 1999  ? High blood pressure   ? Major depressive disorder in partial remission ([place])   ? Morbid obesity ([place])   ? Osteomyelitis of great toe of left foot ([place])   ? Sleep apnea   ? Tobacco use disorder, continuous   ? Ulcer of right heel ([place])   ? followed by podiatry--fully healed 09/2020  ? ? ?Past Surgical History:  ?Procedure Laterality Date  ? DG GREAT TOE LEFT FOOT  2010  ? s/p I and D for osteo  ? ? ?There were no vitals filed for this visit. ? ? Subjective Assessment - 10/04/21 1150   ? ? Subjective Patient reports he is doing well. 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   ? Pertinent History 46 yo Male admitted to the hospital 05/26/2021 - 06/01/2021 at [facility] with lower extremity weakness secondary to peripheral neuropathy and severe spinal stenosis seen on MRI on 05/26/2021. He was discharged to [facility] rehab 06/01/2021 - 06/09/2021. He was discharged to his  brother's home. Prior to weakness he was in a 2 story apartment with stairs to enter. Therefore he is currently living at his brothers home which is 2 story but living is on the main floor; he does have 4 steps to enter home with B railings; He reports navigating those steps well. He is currenlty ambulating with RW, able to take a few steps without walker. Reports falling on 12/9 prior to admittance but otherwise no new falls. He denies any significant numbness/tingling; He does have neuropathy in the feet from diabetes but he doesn't feel that it has worsened. He does have an appointment with Dr. Lacinda Axon (neurosurgeon); He is not sure that they will recommend surgery due to uncontrolled diabetes and concern that surgery may not improve condition;   ? How long can you sit comfortably? several hours   ? How long can you stand comfortably? Unsure- able to stand at least 5 min but hasn't tested it.   ? How long can you walk comfortably? about 500 feet with fatigue, does use RW   ? Diagnostic tests MRI on 05/26/21 IMPRESSION:  1. Multilevel lumbar spondylosis, as detailed above.  2. Severe canal stenosis at L3-L4.  3. Moderate canal stenosis at L2-L3.  4. Mild-to-moderate canal stenosis with moderate-severe left  foraminal  stenosis at L4-L5.   ? Patient Stated Goals "get back into my apartment- negotiate steps, walk without walker"   ? ?  ?  ? ?  ? ? ? ? ? ?Neuro Re-ed ? ?Standing with CGA next to support surface:  ?Airex pad: static stand 30 seconds x 3 trials, noticeable trembling of ankles/LE's with fatigue and challenge to maintain stability ?Airex pad: horizontal head turns 30 seconds scanning room 5x ea direction  ; cueing for arc of motion  ?Airex pad: vertical head turns 5 x ea direction, cueing for arc of motion, noticeable sway with upward gaze increasing demand on ankle righting reaction musculature ?Airex pad: one foot on 6" step one foot on airex pad, hold position for 30 seconds, switch legs, 2x each  LE; ? ?Airex balance beam:  ?-lateral stepping 6x length of // bars with UE support with turn around on airex pad - rates easy  ?-tandem walking 6x length of // bars with UE support but cues for fingertip support only, increased difficulty per pt report, difficulty with LE foot clearance from airex pad to balance beam transition  ? ?Balance course stepping over hurdles and 1/2 foam roller, stepping on airex balance beam and turning on airex pad ( UE support for latter 2 obstacles in // bars) x 3 laps (back and forth = 1 lap) through ? ?TherEx ? ?Leg Press: ?BLE 85# x15 reps, Medium difficulty; ?BLE 100# x10 reps, able to exhibit good control and positioning;  ?BLE heel press 40# 2x10 reps, able to exhibit fair ankle ROM and reports feeling some fatigue in calves;  ? ?Patient able to get LE on/off machine independent which is improvement; He does require extension of machine for LE length;  ? ?PT following up with occupational therapy activities and monitored and provided cues well patient ambulated to ADL kitchen, squatted and removed prepared meal from fridge, and put prepared food on plate and perform transition from standing to sitting at table.  Ended session and Occupational Therapy kitchen.  No loss of balance or instability noted with activities and ADL kitchen. ? ?Pt educated throughout session about proper posture and technique with exercises. Improved exercise technique, movement at target joints, use of target muscles after min to mod verbal, visual, tactile cues. ? ? ? ? ? ? ? ? ? ? ? ? ? ? ? ? ? ? ? ? ? ? ? ? ? ? ? ? ? ? ? ? ? PT Education - 10/04/21 1151   ? ? Education Details exercise technique, body mechanics   ? Person(s) Educated Patient   ? Methods Explanation;Demonstration   ? Comprehension Verbalized understanding;Returned demonstration   ? ?  ?  ? ?  ? ? ? PT Short Term Goals - 09/11/21 0815   ? ?  ? PT SHORT TERM GOAL #1  ? Title Patient will be adherent to HEP at least 3x a week to improve  functional strength and balance for better safety at home.   ? Baseline 2/27: reports doing them 3-4x a week;   ? Time 4   ? Period Weeks   ? Status Achieved   ? Target Date 08/10/21   ?  ? PT SHORT TERM GOAL #2  ? Title Patient will demonstrate improvement in foot/ankle strength by being able to complete BLE heel raise with minimal UE support for better gait safety and mobility;   ? Baseline 2/27: unable; 09/11/21: unable   ? Time 4   ? Period  Weeks   ? Status Not Met   ? Target Date 10/09/21   ? ?  ?  ? ?  ? ? ? ? PT Long Term Goals - 09/11/21 0822   ? ?  ? PT LONG TERM GOAL #1  ? Title Patient will increase BLE gross strength to 4+/5  especially in hip as to improve functional strength for independent gait, increased standing tolerance and increased ADL ability.   ? Baseline 2/27: see flowsheet; 09/11/21: see flowsheet, remains limited significantly in ankle strength   ? Time 8   ? Period Weeks   ? Status Partially Met   ? Target Date 11/06/21   ?  ? PT LONG TERM GOAL #2  ? Title Patient will increase six minute walk test distance to >800 for progression toward community ambulator and improve gait ability   ? Baseline 2/27: 850 feet with RW   ? Time 8   ? Period Weeks   ? Status Achieved   ? Target Date 09/07/21   ?  ? PT LONG TERM GOAL #3  ? Title Patient will demonstrate an improved Berg Balance Score of > 42/56 as to demonstrate improved balance with ADLs such as sitting/standing and transfer balance and reduced fall risk.   ? Baseline 2/27: 25/56; 09/11/21: 37/56   ? Time 8   ? Period Weeks   ? Status On-going   ? Target Date 11/06/21   ?  ? PT LONG TERM GOAL #4  ? Title Patient will reduce timed up and go to <14 seconds to reduce fall risk and demonstrate improved transfer/gait ability.   ? Baseline 2/27: 9.48 sec with RW;   ? Time 8   ? Period Weeks   ? Status Achieved   ? Target Date 09/07/21   ?  ? PT LONG TERM GOAL #5  ? Title Pt will be able to tolerate standing >/= 3 min without UE support to demonstrate  ability to complete grooming/bathing tasks and other standing ADL's without LOB.   ? Baseline 09/11/21: 1 min 45 sec   ? Time 8   ? Period Weeks   ? Status New   ? Target Date 11/06/21   ? ?  ?  ? ?  ? ? ? ? ? ? ? ? Pl

## 2021-10-05 NOTE — Therapy (Signed)
[facility] ?[place], [place], [address] ?Phone: [phone]   Fax:  [phone] ? ?Occupational Therapy Treatment ? ?Patient Details  ?Name: Jerry Wheeler ?MRN: [id number] ?Date of Birth: [birth year] ?Referring Provider (OT): Reesa Chew, [place] ? ? ?Encounter Date: 10/04/2021 ? ? OT End of Session - 10/05/21 0941   ? ? Visit Number 19   ? Number of Visits 34   ? Date for OT Re-Evaluation 11/12/21   ? Authorization Time Period reporting period beginning 08/21/21   ? OT Start Time 1100   ? OT Stop Time 1145   ? OT Time Calculation (min) 45 min   ? Equipment Utilized During Treatment RW   ? Activity Tolerance Patient tolerated treatment well   ? Behavior During Therapy [facility] for tasks assessed/performed   ? ?  ?  ? ?  ? ? ?Past Medical History:  ?Diagnosis Date  ? Anxiety disorder   ? Bipolar disorder ([place])   ? Diabetes mellitus ([place])   ? Diabetic peripheral neuropathy associated with type 2 diabetes mellitus ([place])   ? Emphysema lung ([place])   ? hospitalization 1999  ? High blood pressure   ? Major depressive disorder in partial remission ([place])   ? Morbid obesity ([place])   ? Osteomyelitis of great toe of left foot ([place])   ? Sleep apnea   ? Tobacco use disorder, continuous   ? Ulcer of right heel ([place])   ? followed by podiatry--fully healed 09/2020  ? ? ?Past Surgical History:  ?Procedure Laterality Date  ? DG GREAT TOE LEFT FOOT  2010  ? s/p I and D for osteo  ? ? ?There were no vitals filed for this visit. ? ? Subjective Assessment - 10/04/21 0938   ? ? Subjective  Pt reports his depression got to him on Monday which is why he cancelled his therapy that day. Pt reports doing some better today.   ? Patient is accompanied by: Family member   ? Pertinent History poorly controlled T2DM with peripheral neuropathy, bipolar disorder, OSA who was admitted to [facility] on 05/26/21 with worsening of BLE weakness and  numbness for 3 weeks progressing to inability to walk x  one day. Neurology consulted and MRI spine done revealing severe canal stenosis L3/L4, moderate canal stenosis L2/L3 and mild to moderate canal stenosis with  moderate to severe left foraminal stenosis at L4/L5.   ? Limitations Muscle weakness, mild BUE tremors, impaired balance and gait, hx of falling, neuropathy in BLEs   ? Patient Stated Goals "I want to improve my balance so I can safely be in my kitchen to cook."   ? Currently in Pain? No/denies   ? Pain Score 0-No pain   ? Pain Onset More than a month ago   ? ?  ?  ? ?  ? ?Occupational Therapy Treatment: ?Self Care: ?Pt participated in light meal prep baking activity, working to challenge dynamic standing and standing tolerance.  Pt gathered all supplies, requiring reach into bottom shelf of refrigerator with unilateral support, item transport along countertop to kitchen table while leaving walker near by.  Pt washed and loaded dishes into dishwasher at end of session.  Pt stood for 15 min x2, and 10 min x1.  Short sitting breaks between standing episodes.  Pt required 2 vc to initiate a sitting rest break d/t noted trembling of legs.  Pt will continue to benefit from skilled OT for increasing activity tolerance and  dynamic standing balance for better tolerance and safety with ADL/IADL/community outings. ? ? ? OT Education - 10/04/21 0939   ? ? Education Details EC with kitchen activity   ? Person(s) Educated Patient   ? Methods Explanation;Verbal cues   ? Comprehension Verbalized understanding;Returned demonstration   ? ?  ?  ? ?  ? ? ? OT Short Term Goals - 08/21/21 0951   ? ?  ? OT SHORT TERM GOAL #1  ? Title Pt will verbalize and demonstrate 3 fall prevention strategies to reduce fall risk with ADLs/IADLs.   ? Baseline Eval: not yet initiated; 08/21/21: identifies 3 strategies with min vc   ? Time 6   ? Period Weeks   ? Status Partially Met   ? Target Date 10/02/21   ?  ? OT SHORT TERM GOAL #2  ? Title Pt will verbalize 3 compensatory strategies to manage  BUE tremors during ADL completion.   ? Baseline Eval: education needed; 08/21/21: Able to identify 2 strategies with min vc   ? Time 6   ? Period Weeks   ? Status Partially Met   ? Target Date 10/02/21   ? ?  ?  ? ?  ? ? ? ? OT Long Term Goals - 08/21/21 0953   ? ?  ? OT LONG TERM GOAL #1  ? Title Pt will increase FOTO score to 60 or better to indicate increased functional performance.   ? Baseline Eval: FOTO 51; 08/21/21: 48.4   ? Time 12   ? Period Weeks   ? Status On-going   ? Target Date 11/12/21   ?  ? OT LONG TERM GOAL #2  ? Title Pt will increase functional reach to >12" without UE support to decrease fall risk with functional reaching during daily tasks.   ? Baseline Eval: Functional reach 9" without AD, 13" with 1 hand on support surface; 08/21/21: 12" without AD, 14" with 1 hand on support surface   ? Time 12   ? Period Weeks   ? Status Revised   ? Target Date 11/12/21   ?  ? OT LONG TERM GOAL #3  ? Title Pt will increase 360 degree turn test score to WNL (4 secs or less) without AD to indicate improved dynamic standing/decreased fall risk within small spaces for kitchen mobility.   ? Baseline Eval: 360 turn test score 7 sec with RW; 08/21/21: 4 sec with RW   ? Time 12   ? Period Weeks   ? Status Revised   ? Target Date 11/12/21   ?  ? OT LONG TERM GOAL #4  ? Title Pt will be modified indep to perform hot meal prep at stove top.   ? Baseline Eval: Dep for hot meal prep, using microwave only and preparing cold meals d/t limited ability to release hands from walker; 08/21/21: Pt managing cold and hot LIGHT meal prep (microwaveable) with modified indep.   ? Time 12   ? Period Weeks   ? Status On-going   ? Target Date 11/12/21   ?  ? OT LONG TERM GOAL #5  ? Title Pt will manage laundry tasks with modified indep.   ? Baseline Eval: max A; brother currently managing pt's laundry; 08/21/21: pt has not yet attempted laundry, but educated today on transport strategies to manage laundry up/down stairs of apartment   ? Time 6    ? Period Weeks   ? Status On-going   ? Target Date  11/12/21   ?  ? OT LONG TERM GOAL #6  ? Title Pt will increase bilat grip strength by 20 or more lbs to improve ability to hold and carry ADL supplies.   ? Baseline Eval: R grip strength 66 lbs, L 68 (~20 below normal range for pt's age and gender); 08/21/21: R grip 79, L grip 80   ? Time 12   ? Period Weeks   ? Status Revised   ? Target Date 11/12/21   ? ?  ?  ? ?  ? ? Plan - 10/04/21 0949   ? ? Clinical Impression Statement Pt participated in light meal prep baking activity, working to challenge dynamic standing and standing tolerance.  Pt gathered all supplies, requiring reach into bottom shelf of refrigerator with unilateral support, item transport along countertop to kitchen table while leaving walker near by.  Pt washed and loaded dishes into dishwasher at end of session.  Pt stood for 15 min x2, and 10 min x1.  Short sitting breaks between standing episodes.  Pt required 2 vc to initiate a sitting rest break d/t noted trembling of legs.  Pt will continue to benefit from skilled OT for increasing activity tolerance and dynamic standing balance for better tolerance and safety with ADL/IADL/community outings.   ? OT Occupational Profile and History Problem Focused Assessment - Including review of records relating to presenting problem   ? Occupational performance deficits (Please refer to evaluation for details): ADL's;IADL's   ? Body Structure / Function / Physical Skills ADL;Coordination;Endurance;GMC;UE functional use;Balance;Sensation;Body mechanics;Decreased knowledge of use of DME;IADL;Pain;Dexterity;FMC;Strength;Gait;Mobility   ? Rehab Potential Good   ? Clinical Decision Making Limited treatment options, no task modification necessary   ? Comorbidities Affecting Occupational Performance: May have comorbidities impacting occupational performance   ? Modification or Assistance to Complete Evaluation  No modification of tasks or assist necessary to complete  eval   ? OT Duration 12 weeks   ? OT Treatment/Interventions Self-care/ADL training;Therapeutic exercise;DME and/or AE instruction;Functional Mobility Training;Balance training;Energy conservation;[place]

## 2021-10-09 ENCOUNTER — Ambulatory Visit: Payer: Medicare Other | Admitting: Physical Therapy

## 2021-10-09 ENCOUNTER — Ambulatory Visit: Payer: Medicare Other | Admitting: Occupational Therapy

## 2021-10-09 ENCOUNTER — Encounter: Payer: Self-pay | Admitting: Occupational Therapy

## 2021-10-09 DIAGNOSIS — R2689 Other abnormalities of gait and mobility: Secondary | ICD-10-CM

## 2021-10-09 DIAGNOSIS — M6281 Muscle weakness (generalized): Secondary | ICD-10-CM | POA: Diagnosis not present

## 2021-10-09 DIAGNOSIS — R2681 Unsteadiness on feet: Secondary | ICD-10-CM

## 2021-10-09 DIAGNOSIS — R262 Difficulty in walking, not elsewhere classified: Secondary | ICD-10-CM

## 2021-10-09 DIAGNOSIS — R278 Other lack of coordination: Secondary | ICD-10-CM

## 2021-10-09 NOTE — Therapy (Signed)
[place] ?[facility] ?[address] ?[place], [place], [address] ?Phone: [phone]   Fax:  [phone] ? ?Occupational Therapy Progress Note ? ?Dates of reporting period  08/21/2021   to   08/21/2021  ? ?Patient Details  ?Name: Jerry Wheeler ?MRN: [id number] ?Date of Birth: [birth year] ?Referring Provider (OT): Delle Reining, [place] ? ? ?Encounter Date: 10/09/2021 ? ? OT End of Session - 10/09/21 0851   ? ? Visit Number 20   ? Number of Visits 34   ? Date for OT Re-Evaluation 11/12/21   ? Authorization Time Period reporting period beginning 08/21/21   ? OT Start Time 0845   ? OT Stop Time 0930   ? OT Time Calculation (min) 45 min   ? Activity Tolerance Patient tolerated treatment well   ? Behavior During Therapy [facility] for tasks assessed/performed   ? ?  ?  ? ?  ? ? ?Past Medical History:  ?Diagnosis Date  ? Anxiety disorder   ? Bipolar disorder (HCC)   ? Diabetes mellitus (HCC)   ? Diabetic peripheral neuropathy associated with type 2 diabetes mellitus (HCC)   ? Emphysema lung (HCC)   ? hospitalization 1999  ? High blood pressure   ? 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 Morbid obesity (HCC)   ? Osteomyelitis of great toe of left foot (HCC)   ? Sleep apnea   ? Tobacco use disorder, continuous   ? Ulcer of right heel (HCC)   ? followed by podiatry--fully healed 09/2020  ? ? ?Past Surgical History:  ?Procedure Laterality Date  ? DG GREAT TOE LEFT FOOT  2010  ? s/p I and D for osteo  ? ? ?There were no vitals filed for this visit. ? ? Subjective Assessment - 10/09/21 0850   ? ? Subjective  Pt. reports LE soreness since last session   ? Patient is accompanied by: Family member   ? Pertinent History poorly controlled T2DM with peripheral neuropathy, bipolar disorder, OSA who was admitted to [facility] on 05/26/21 with worsening of BLE weakness and  numbness for 3 weeks progressing to inability to walk x one day. Neurology consulted and MRI spine done revealing severe  canal stenosis L3/L4, moderate canal stenosis L2/L3 and mild to moderate canal stenosis with  moderate to severe left foraminal stenosis at L4/L5.   ? Currently in Pain? Yes   ? Pain Score 4    ? Pain Location Leg   ? Pain Orientation Right;Left   ? Pain Descriptors / Indicators Sore   ? Pain Type Chronic pain   ? ?  ?  ? ?  ? ?OT TREATMENT   ? ?Therapeutic Exercise: ? ?Pt. performed 3# dowel ex. For UE strengthening secondary to weakness. Bilateral shoulder flexion, chest press, circular patterns, and elbow flexion/extension were performed. 3# dumbbell ex. 4# for elbow flexion and extension,  3# for forearm supination/pronation, wrist flexion/extension, and radial deviation. Pt. requires rest breaks and verbal cues for proper technique. There. Ex. was performed to increase UE strength needed for ADLs, and IADLs.  ? ?Measurements were obtained, and goals were reviewed with the pt. Pt. has made progress since the past progress reporting period. Pt. is now able to complete laundry independently, using a bag to transport the clothing with the walker. Pt. Is able to cook at the stovetop, and remove items from the oven. Pt.'s FOTO score has improver to 53. Pt. Continues to work on improving UUE strength,  [facility] skills, standing activity tolerance for ADLs, and review of compensatory strategies for tremors, and fall prevention strategies to improve and maximize safety, and independence with ADLs, and IADLs. ?  ? ? ? [facility] OT Assessment - 10/09/21 0906   ? ?  ? Coordination  ? Right 9 Hole Peg Test 33   ? Left 9 Hole Peg Test 47   ?  ? Hand Function  ? Right Hand Grip (lbs) 85   ? Right Hand Lateral Pinch 26 lbs   ? Right Hand 3 Point Pinch 16 lbs   ? Left Hand Grip (lbs) 80   ? Left Hand Lateral Pinch 20 lbs   ? Left 3 point pinch 17 lbs   ? ?  ?  ? ?  ? ? ? ? ? ?  ? ? ? ? ? ? ? ? ? ? ? ? ? ? ? ? ? OT Education - 10/09/21 0851   ? ? Education Details Goals   ? Person(s) Educated Patient   ? Methods Explanation;Verbal cues   ?  Comprehension Verbalized understanding;Returned demonstration   ? ?  ?  ? ?  ? ? ? OT Short Term Goals - 10/09/21 0918   ? ?  ? OT SHORT TERM GOAL #1  ? Title Pt will verbalize and demonstrate 3 fall prevention strategies to reduce fall risk with ADLs/IADLs.   ? Baseline Eval: not yet initiated; 08/21/21: identifies 3 strategies with min vc 10/09/2021:As pt. has improved with strength, and mobility, pt. is consistently using strateges forfall prevention.   ? Time 6   ? Period Weeks   ? Status Achieved   ?  ? OT SHORT TERM GOAL #2  ? Title Pt will verbalize 3 compensatory strategies to manage BUE tremors during ADL completion.   ? Baseline Eval: education needed; 08/21/21: Able to identify 2 strategies with min vc. 10/09/2021: Pt. is able to identify 2 compensatory strategies for tremors.   ? Time 6   ? Period Weeks   ? Status On-going   ? Target Date 11/20/21   ? ?  ?  ? ?  ? ? ? ? OT Long Term Goals - 10/09/21 0924   ? ?  ? OT LONG TERM GOAL #1  ? Title Pt will increase FOTO score to 60 or better to indicate increased functional performance.   ? Baseline Eval: FOTO 51; 08/21/21: 48.4, 10/09/2021: 53   ? Time 12   ? Period Weeks   ? Target Date 11/12/21   ?  ? OT LONG TERM GOAL #2  ? Title Pt will increase functional reach to >12" without UE support to decrease fall risk with functional reaching during daily tasks.   ? Baseline Eval: Functional reach 9" without AD, 13" with 1 hand on support surface; 08/21/21: 12" without AD, 14" with 1 hand on support surface.10/09/2021: 14" without hand support on the surface.   ? Time 12   ? Status On-going   ? Target Date 11/12/21   ?  ? OT LONG TERM GOAL #3  ? Title Pt will increase 360 degree turn test score to WNL (4 secs or less) without AD to indicate improved dynamic standing/decreased fall risk within small spaces for kitchen mobility.   ? Baseline Eval: 360 turn test score 7 sec with RW; 08/21/21: 4 sec with RW 10/09/2021: TBA   ? Time 12   ? Period Weeks   ? Status Deferred   ? Target  Date  11/12/21   ?  ? OT LONG TERM GOAL #4  ? Title Pt will be modified indep to perform hot meal prep at stove top.   ? Baseline Eval: Dep for hot meal prep, using microwave only and preparing cold meals d/t limited ability to release hands from walker; 08/21/21: Pt managing cold and hot LIGHT meal prep (microwaveable) with modified indep.10/09/2021: Pt. is able to retrieve items from the oven, and uses the stovetop  to fry items in the frying pan.   ? Time 12   ? Period Weeks   ? Status On-going   ? Target Date 11/12/21   ?  ? OT LONG TERM GOAL #5  ? Title Pt will manage laundry tasks with modified indep.   ? Baseline Eval: max A; brother currently managing pt's laundry; 08/21/21: pt has not yet attempted laundry, but educated today on transport strategies to manage laundry up/down stairs of apartment 10/09/2021: Independent   ? Time 6   ? Period Weeks   ? Status Achieved   ? Target Date 11/12/21   ?  ? OT LONG TERM GOAL #6  ? Title Pt will increase bilat grip strength by 20 or more lbs to improve ability to hold and carry ADL supplies.   ? Baseline Eval: R grip strength 66 lbs, L 68 (~20 below normal range for pt's age and gender); 08/21/21: R grip 79, L grip 80 10/09/2021: R: 85, L: 80   ? Time 12   ? Period Weeks   ? Status On-going   ? Target Date 11/12/21   ? ?  ?  ? ?  ? ? ? ? ? ? ? ? Plan - 10/09/21 0852   ? ? Clinical Impression Statement Measurements were obtained, and goals were reviewed with the pt. Pt. has made progress since the past progress reporting period. Pt. is now able to complete laundry independently, using a bag to transport the clothing with the walker. Pt. Is able to cook at the stovetop, and remove items from the oven. Pt.'s FOTO score has improver to 53. Pt. Continues to work on improving UUE strength, [facility] skills, standing activity tolerance for ADLs, and review of compensatory strategies for tremors, and fall prevention strategies to improve and maximize safety, and independence with ADLs, and  IADLs. ?   ? OT Occupational Profile and History Problem Focused Assessment - Including review of records relating to presenting problem   ? Occupational performance deficits (Please refer to evaluation

## 2021-10-09 NOTE — Therapy (Signed)
[place] ?[facility] ?[place], [place], [address] ?Phone: [phone]   Fax:  [phone] ? ?Physical Therapy Treatment/Physical Therapy Progress Note ? ? ?Dates of reporting period  08/21/21   to   10/09/21 ? ? ?Patient Details  ?Name: Jerry Wheeler ?MRN: [id number] ?Date of Birth: Apr 14, 1976 ?Referring Provider (PT): Reesa Chew PA/ Netty Starring PCP ? ? ?Encounter Date: 10/09/2021 ? ? PT End of Session - 10/09/21 0818   ? ? Visit Number 20   ? Number of Visits 23   ? Date for PT Re-Evaluation 11/06/21   ? Authorization Type medicare   ? PT Start Time 0815   ? PT Stop Time 0844   ? PT Time Calculation (min) 29 min   ? Equipment Utilized During Treatment Gait belt   ? Activity Tolerance Patient tolerated treatment well   ? Behavior During Therapy [facility] for tasks assessed/performed   ? ?  ?  ? ?  ? ? ?Past Medical History:  ?Diagnosis Date  ? Anxiety disorder   ? Bipolar disorder ([place])   ? Diabetes mellitus ([place])   ? Diabetic peripheral neuropathy associated with type 2 diabetes mellitus ([place])   ? Emphysema lung ([place])   ? hospitalization 1999  ? High blood pressure   ? Major depressive disorder in partial remission ([place])   ? Morbid obesity ([place])   ? Osteomyelitis of great toe of left foot ([place])   ? Sleep apnea   ? Tobacco use disorder, continuous   ? Ulcer of right heel ([place])   ? followed by podiatry--fully healed 09/2020  ? ? ?Past Surgical History:  ?Procedure Laterality Date  ? DG GREAT TOE LEFT FOOT  2010  ? s/p I and D for osteo  ? ? ?There were no vitals filed for this visit. ? ? Subjective Assessment - 10/09/21 0814   ? ? Subjective Patient reports he is doing well. Pt reports he has ipmroved significantly but still feel she has room to grow toward his goals. States he would like to continue to build his onfidence ambulating without and AD or even ambulating with a cane copmared to his walker for community ambulation.   ? Pertinent History 46 yo Male  admitted to the hospital 05/26/2021 - 06/01/2021 at [facility] with lower extremity weakness secondary to peripheral neuropathy and severe spinal stenosis seen on MRI on 05/26/2021. He was discharged to [facility] rehab 06/01/2021 - 06/09/2021. He was discharged to his brother's home. Prior to weakness he was in a 2 story apartment with stairs to enter. Therefore he is currently living at his brothers home which is 2 story but living is on the main floor; he does have 4 steps to enter home with B railings; He reports navigating those steps well. He is currenlty ambulating with RW, able to take a few steps without walker. Reports falling on 12/9 prior to admittance but otherwise no new falls. He denies any significant numbness/tingling; He does have neuropathy in the feet from diabetes but he doesn't feel that it has worsened. He does have an appointment with Dr. Lacinda Axon (neurosurgeon); He is not sure that they will recommend surgery due to uncontrolled diabetes and concern that surgery may not improve condition;   ? How long can you sit comfortably? several hours   ? How long can you stand comfortably? Unsure- able to stand at least 5 min but hasn't tested it.   ? How long can you walk comfortably? 5-7  minutes   ? Diagnostic tests MRI on 05/26/21 IMPRESSION:  1. Multilevel lumbar spondylosis, as detailed above.  2. Severe canal stenosis at L3-L4.  3. Moderate canal stenosis at L2-L3.  4. Mild-to-moderate canal stenosis with moderate-severe left  foraminal stenosis at L4-L5.   ? Patient Stated Goals "get back into my apartment- negotiate steps, walk without walker"   ? Currently in Pain? No/denies   ? Pain Score 0-No pain   ? ?  ?  ? ?  ? ? ? ? ? [facility] PT Assessment - 10/09/21 0001   ? ?  ? Berg Balance Test  ? Sit to Stand Able to stand  independently using hands   ? Standing Unsupported Able to stand 2 minutes with supervision   ? Sitting with Back Unsupported but Feet Supported on Floor or Stool Able to sit safely and securely  2 minutes   ? Stand to Sit Controls descent by using hands   ? Transfers Able to transfer safely, definite need of hands   ? Standing Unsupported with Eyes Closed Able to stand 3 seconds   ? Standing Unsupported with Feet Together Able to place feet together independently and stand 1 minute safely   ? From Standing, Reach Forward with Outstretched Arm Can reach confidently >25 cm (10")   ? From Standing Position, Pick up Object from Floor Able to pick up shoe, needs supervision   ? From Standing Position, Turn to Look Behind Over each Shoulder Looks behind one side only/other side shows less weight shift   ? Turn 360 Degrees Able to turn 360 degrees safely but slowly   ? Standing Unsupported, Alternately Place Feet on Step/Stool Able to stand independently and complete 8 steps >20 seconds   unsteady but no LOB or correction needed by PT  ? Standing Unsupported, One Foot in Front Able to plae foot ahead of the other independently and hold 30 seconds   ? Standing on One Leg Tries to lift leg/unable to hold 3 seconds but remains standing independently   ? Total Score 41   ? Berg comment: 37-45 significant   ? ?  ?  ? ?  ? ? ? ? ?  While doing Berg balance test patient able to stand for 3 minutes without upper extremity support but did have 1 minor loss of balance corrected with stepping strategy as well as significant lower extremity trembling and fatigue noted as patient progressed past minute 2. ? ?When patient attempted heel raises patient unable to perform adequate range of motion heel raise either without upper extremity support or with upper extremity support and pushing on stair railings.  Patient instructed to complete progressive heel raising activities at home utilizing items around his home for seated heel raising and holds to continue to progress his strength in his ankles to improve his balance and stability. ? ? ? ? ? ? ? ? ? ? ? ? ? ? ? ? ? ? ? ? PT Education - 10/09/21 0817   ? ? Education Details  Progress with therapy   ? Person(s) Educated Patient   ? Methods Explanation   ? Comprehension Verbalized understanding   ? ?  ?  ? ?  ? ? ? PT Short Term Goals - 10/09/21 0820   ? ?  ? PT SHORT TERM GOAL #1  ? Title Patient will be adherent to HEP at least 3x a week to improve functional strength and balance for better safety at home.   ?  Baseline 2/27: reports doing them 3-4x a week;   ? Time 4   ? Period Weeks   ? Status Achieved   ? Target Date 08/10/21   ?  ? PT SHORT TERM GOAL #2  ? Title Patient will demonstrate improvement in foot/ankle strength by being able to complete BLE heel raise with minimal UE support for better gait safety and mobility;   ? Baseline 2/27: unable; 09/11/21: unable, good MMT strength, unable to lift off floor wihtout UE support   ? Time 4   ? Period Weeks   ? Status Not Met   ? Target Date 10/09/21   ? ?  ?  ? ?  ? ? ? ? PT Long Term Goals - 10/09/21 0822   ? ?  ? PT LONG TERM GOAL #1  ? Title Patient will increase BLE gross strength to 4+/5  especially in hip as to improve functional strength for independent gait, increased standing tolerance and increased ADL ability.   ? Baseline 2/27: see flowsheet; 09/11/21: see flowsheet, remains limited significantly in ankle strength   ? Time 8   ? Period Weeks   ? Status Partially Met   ? Target Date 11/06/21   ?  ? PT LONG TERM GOAL #2  ? Title Patient will increase six minute walk test distance to >800 for progression toward community ambulator and improve gait ability   ? Baseline 2/27: 850 feet with RW   ? Time 8   ? Period Weeks   ? Status Achieved   ? Target Date 09/07/21   ?  ? PT LONG TERM GOAL #3  ? Title Patient will demonstrate an improved Berg Balance Score of > 42/56 as to demonstrate improved balance with ADLs such as sitting/standing and transfer balance and reduced fall risk.   ? Baseline 2/27: 25/56; 09/11/21: 37/56 4/24: 41/56   ? Time 8   ? Period Weeks   ? Status On-going   ? Target Date 11/06/21   ?  ? PT LONG TERM GOAL #4   ? Title Patient will reduce timed up and go to <14 seconds to reduce fall risk and demonstrate improved transfer/gait ability.   ? Baseline 2/27: 9.48 sec with RW;   ? Time 8   ? Period Weeks   ? Status Achi

## 2021-10-11 ENCOUNTER — Ambulatory Visit: Payer: Medicare Other | Admitting: Physical Therapy

## 2021-10-11 ENCOUNTER — Ambulatory Visit: Payer: Medicare Other

## 2021-10-12 ENCOUNTER — Ambulatory Visit: Payer: Medicare Other | Admitting: Physical Therapy

## 2021-10-16 ENCOUNTER — Encounter: Payer: Self-pay | Admitting: Physical Therapy

## 2021-10-16 ENCOUNTER — Ambulatory Visit: Payer: Medicare Other | Attending: Physical Medicine and Rehabilitation | Admitting: Physical Therapy

## 2021-10-16 DIAGNOSIS — R2681 Unsteadiness on feet: Secondary | ICD-10-CM | POA: Insufficient documentation

## 2021-10-16 DIAGNOSIS — M6281 Muscle weakness (generalized): Secondary | ICD-10-CM | POA: Diagnosis present

## 2021-10-16 DIAGNOSIS — R269 Unspecified abnormalities of gait and mobility: Secondary | ICD-10-CM | POA: Insufficient documentation

## 2021-10-16 DIAGNOSIS — R2689 Other abnormalities of gait and mobility: Secondary | ICD-10-CM | POA: Insufficient documentation

## 2021-10-16 DIAGNOSIS — R278 Other lack of coordination: Secondary | ICD-10-CM | POA: Insufficient documentation

## 2021-10-16 DIAGNOSIS — R262 Difficulty in walking, not elsewhere classified: Secondary | ICD-10-CM | POA: Insufficient documentation

## 2021-10-16 NOTE — Therapy (Addendum)
[place] [facility] ?[place], [place], [address] ?Phone: [phone]   Fax:  [phone] ? ?Physical Therapy Treatment ? ?Patient Details  ?Name: Jerry Wheeler ?MRN: [id number] ?Date of Birth: 03-Jul-1975 ?Referring Provider (PT): Reesa Chew PA/ Netty Starring PCP ? ? ?Encounter Date: 10/16/2021 ? ? PT End of Session - 10/16/21 1019   ? ? Visit Number 21   ? Number of Visits 23   ? Date for PT Re-Evaluation 11/06/21   ? Authorization Type medicare   ? PT Start Time 0815   ? PT Stop Time 0845   ? PT Time Calculation (min) 30 min   ? Equipment Utilized During Treatment Gait belt   ? Activity Tolerance Patient tolerated treatment well   ? Behavior During Therapy [facility] for tasks assessed/performed   ? ?  ?  ? ?  ? ? ?Past Medical History:  ?Diagnosis Date  ? Anxiety disorder   ? Bipolar disorder ([place])   ? Diabetes mellitus ([place])   ? Diabetic peripheral neuropathy associated with type 2 diabetes mellitus ([place])   ? Emphysema lung ([place])   ? hospitalization 1999  ? High blood pressure   ? Major depressive disorder in partial remission ([place])   ? Morbid obesity ([place])   ? Osteomyelitis of great toe of left foot ([place])   ? Sleep apnea   ? Tobacco use disorder, continuous   ? Ulcer of right heel ([place])   ? followed by podiatry--fully healed 09/2020  ? ? ?Past Surgical History:  ?Procedure Laterality Date  ? DG GREAT TOE LEFT FOOT  2010  ? s/p I and D for osteo  ? ? ?There were no vitals filed for this visit. ? ? Subjective Assessment - 10/16/21 0818   ? ? Subjective Patient reports he is doing well. Pt reports he has been playing a new video game and it has been fun.   ? Pertinent History 46 yo Male admitted to the hospital 05/26/2021 - 06/01/2021 at [facility] with lower extremity weakness secondary to peripheral neuropathy and severe spinal stenosis seen on MRI on 05/26/2021. He was discharged to [facility] rehab 06/01/2021 - 06/09/2021. He was discharged to his brother's home.  Prior to weakness he was in a 2 story apartment with stairs to enter. Therefore he is currently living at his brothers home which is 2 story but living is on the main floor; he does have 4 steps to enter home with B railings; He reports navigating those steps well. He is currenlty ambulating with RW, able to take a few steps without walker. Reports falling on 12/9 prior to admittance but otherwise no new falls. He denies any significant numbness/tingling; He does have neuropathy in the feet from diabetes but he doesn't feel that it has worsened. He does have an appointment with Dr. Lacinda Axon (neurosurgeon); He is not sure that they will recommend surgery due to uncontrolled diabetes and concern that surgery may not improve condition;   ? How long can you sit comfortably? several hours   ? How long can you stand comfortably? Unsure- able to stand at least 5 min but hasn't tested it.   ? How long can you walk comfortably? 5-7 minutes   ? Diagnostic tests MRI on 05/26/21 IMPRESSION:  1. Multilevel lumbar spondylosis, as detailed above.  2. Severe canal stenosis at L3-L4.  3. Moderate canal stenosis at L2-L3.  4. Mild-to-moderate canal stenosis with moderate-severe left  foraminal stenosis at L4-L5.   ?  Patient Stated Goals "get back into my apartment- negotiate steps, walk without walker"   ? Currently in Pain? No/denies   ? Pain Score 0-No pain   ? ?  ?  ? ?  ? ? ? ? ?Exercise/Activity Sets/Reps/Time/ Resistance Assistance Charge type Comments  ?NuStep lower extremity only progressive increase in resistance 2 minutes each level 3, level 4, level 5  There ex Cues for steps per minute as well as assistance with increasing resistance at intervals  ?Ambulation with large-base quad cane X70 feet with turnaround at halfway point ?Time 70 feet weaving around cones with turnaround at halfway point CGA Gait training Cues for proper sequencing.  With increased practice patient sequencing did improve but had increased difficulty when  managing weaving around objects and cones.  We will continue to work in future sessions.  ?Heel raises with resistance 2x15 repetitions/ 9# weight Seated Therapeutic exercise Patient instructed he can complete at home with household objects as weights  ?Standing on Airex pad completing dual task activity on white board X3 minutes Intermittent upper extremity support, contact-guard assist Therapeutic exercise To improve lower extremity endurance and stability with prolonged standing.  ?      ?      ?      ?      ?      ?      ?      ?Treatment Provided this session  ? ?Pt educated throughout session about proper posture and technique with exercises. Improved exercise technique, movement at target joints, use of target muscles after min to mod verbal, visual, tactile cues. ?Note: Portions of this document were prepared using Dragon voice recognition software and although reviewed may contain unintentional dictation errors in syntax, grammar, or spelling. ? ? ? ? ? ? ? ? ? ? ? ? ? ? ? ? ? ? ? ? ? ? ? ? ? ? PT Education - 10/16/21 0823   ? ? Education Details Exercise technique   ? Person(s) Educated Patient   ? Methods Explanation;Demonstration   ? Comprehension Verbalized understanding;Returned demonstration   ? ?  ?  ? ?  ? ? ? PT Short Term Goals - 10/09/21 0820   ? ?  ? PT SHORT TERM GOAL #1  ? Title Patient will be adherent to HEP at least 3x a week to improve functional strength and balance for better safety at home.   ? Baseline 2/27: reports doing them 3-4x a week;   ? Time 4   ? Period Weeks   ? Status Achieved   ? Target Date 08/10/21   ?  ? PT SHORT TERM GOAL #2  ? Title Patient will demonstrate improvement in foot/ankle strength by being able to complete BLE heel raise with minimal UE support for better gait safety and mobility;   ? Baseline 2/27: unable; 09/11/21: unable, good MMT strength, unable to lift off floor wihtout UE support   ? Time 4   ? Period Weeks   ? Status Not Met   ? Target Date 10/09/21    ? ?  ?  ? ?  ? ? ? ? PT Long Term Goals - 10/09/21 0822   ? ?  ? PT LONG TERM GOAL #1  ? Title Patient will increase BLE gross strength to 4+/5  especially in hip as to improve functional strength for independent gait, increased standing tolerance and increased ADL ability.   ? Baseline 2/27: see flowsheet; 09/11/21: see flowsheet,  remains limited significantly in ankle strength   ? Time 8   ? Period Weeks   ? Status Partially Met   ? Target Date 11/06/21   ?  ? PT LONG TERM GOAL #2  ? Title Patient will increase six minute walk test distance to >800 for progression toward community ambulator and improve gait ability   ? Baseline 2/27: 850 feet with RW   ? Time 8   ? Period Weeks   ? Status Achieved   ? Target Date 09/07/21   ?  ? PT LONG TERM GOAL #3  ? Title Patient will demonstrate an improved Berg Balance Score of > 42/56 as to demonstrate improved balance with ADLs such as sitting/standing and transfer balance and reduced fall risk.   ? Baseline 2/27: 25/56; 09/11/21: 37/56 4/24: 41/56   ? Time 8   ? Period Weeks   ? Status On-going   ? Target Date 11/06/21   ?  ? PT LONG TERM GOAL #4  ? Title Patient will reduce timed up and go to <14 seconds to reduce fall risk and demonstrate improved transfer/gait ability.   ? Baseline 2/27: 9.48 sec with RW;   ? Time 8   ? Period Weeks   ? Status Achieved   ? Target Date 09/07/21   ?  ? PT LONG TERM GOAL #5  ? Title Pt will be able to tolerate standing >/= 3 min without UE support and without LOB to demonstrate ability to complete grooming/bathing tasks and other standing ADL's without LOB.   ? Baseline 09/11/21: 1 min 45 sec 10/09/21: 3 min but 1 LOB wiht step correction and significant fatigue and LE trembling   ? Time 8   ? Period Weeks   ? Status Revised   ? Target Date 11/06/21   ? ?  ?  ? ?  ? ? ? ? ? ? ? ? Plan - 10/16/21 1019   ? ? Clinical Impression Statement Pt presents to PT with excellent motivation for completion of PT session. Pt progressed with [facility] as AD for  activities today and with practice he hdemonstrated good gait mechanics with activities with this. Pt also introduced to progressive calf/ plantar flexion strengthening exercises to be completed as addi

## 2021-10-18 ENCOUNTER — Ambulatory Visit: Payer: Medicare Other

## 2021-10-18 ENCOUNTER — Ambulatory Visit: Payer: Medicare Other | Admitting: Physical Therapy

## 2021-10-18 DIAGNOSIS — R262 Difficulty in walking, not elsewhere classified: Secondary | ICD-10-CM

## 2021-10-18 DIAGNOSIS — M6281 Muscle weakness (generalized): Secondary | ICD-10-CM

## 2021-10-18 DIAGNOSIS — R278 Other lack of coordination: Secondary | ICD-10-CM

## 2021-10-18 DIAGNOSIS — R2681 Unsteadiness on feet: Secondary | ICD-10-CM | POA: Diagnosis not present

## 2021-10-18 DIAGNOSIS — R2689 Other abnormalities of gait and mobility: Secondary | ICD-10-CM

## 2021-10-18 NOTE — Therapy (Signed)
[place] [facility] ?[place], [place], [address] ?Phone: [phone]   Fax:  [phone] ? ?Physical Therapy Treatment ? ?Patient Details  ?Name: Jerry Wheeler ?MRN: [id number] ?Date of Birth: 1976/04/03 ?Referring Provider (PT): Reesa Chew PA/ Netty Starring PCP ? ? ?Encounter Date: 10/18/2021 ? ? PT End of Session - 10/18/21 1158   ? ? Visit Number 22   ? Number of Visits 23   ? Date for PT Re-Evaluation 11/06/21   ? Authorization Type medicare   ? PT Start Time 1146   ? PT Stop Time 1228   ? PT Time Calculation (min) 42 min   ? Equipment Utilized During Treatment Gait belt   ? Activity Tolerance Patient tolerated treatment well   ? Behavior During Therapy [facility] for tasks assessed/performed   ? ?  ?  ? ?  ? ? ?Past Medical History:  ?Diagnosis Date  ? Anxiety disorder   ? Bipolar disorder ([place])   ? Diabetes mellitus ([place])   ? Diabetic peripheral neuropathy associated with type 2 diabetes mellitus ([place])   ? Emphysema lung ([place])   ? hospitalization 1999  ? High blood pressure   ? Major depressive disorder in partial remission ([place])   ? Morbid obesity ([place])   ? Osteomyelitis of great toe of left foot ([place])   ? Sleep apnea   ? Tobacco use disorder, continuous   ? Ulcer of right heel ([place])   ? followed by podiatry--fully healed 09/2020  ? ? ?Past Surgical History:  ?Procedure Laterality Date  ? DG GREAT TOE LEFT FOOT  2010  ? s/p I and D for osteo  ? ? ?There were no vitals filed for this visit. ? ? Subjective Assessment - 10/18/21 1147   ? ? Subjective Pt reports no significant changes since last session. Reports no falls or LOB since last session.   ? Pertinent History 46 yo Male admitted to the hospital 05/26/2021 - 06/01/2021 at [facility] with lower extremity weakness secondary to peripheral neuropathy and severe spinal stenosis seen on MRI on 05/26/2021. He was discharged to [facility] rehab 06/01/2021 - 06/09/2021. He was discharged to his brother's home. Prior  to weakness he was in a 2 story apartment with stairs to enter. Therefore he is currently living at his brothers home which is 2 story but living is on the main floor; he does have 4 steps to enter home with B railings; He reports navigating those steps well. He is currenlty ambulating with RW, able to take a few steps without walker. Reports falling on 12/9 prior to admittance but otherwise no new falls. He denies any significant numbness/tingling; He does have neuropathy in the feet from diabetes but he doesn't feel that it has worsened. He does have an appointment with Dr. Lacinda Axon (neurosurgeon); He is not sure that they will recommend surgery due to uncontrolled diabetes and concern that surgery may not improve condition;   ? How long can you sit comfortably? several hours   ? How long can you stand comfortably? Unsure- able to stand at least 5 min but hasn't tested it.   ? How long can you walk comfortably? 5-7 minutes   ? Diagnostic tests MRI on 05/26/21 IMPRESSION:  1. Multilevel lumbar spondylosis, as detailed above.  2. Severe canal stenosis at L3-L4.  3. Moderate canal stenosis at L2-L3.  4. Mild-to-moderate canal stenosis with moderate-severe left  foraminal stenosis at L4-L5.   ? Patient Stated Goals "get  back into my apartment- negotiate steps, walk without walker"   ? ?  ?  ? ?  ? ? ?Exercise/Activity Sets/Reps/Time/ Resistance Assistance Charge type Comments  ?NuStep lower extremity only progressive increase in resistance 2 minutes each level 3, level 4, level 5  There ex Cues for steps per minute as well as assistance with increasing resistance at intervals  ?Ambulation with large-base quad cane X70 feet with turnaround at halfway point ?Time 70 feet weaving around cones with turnaround at halfway point CGA Gait training Cues for proper sequencing.  With increased practice patient sequencing did improve but had increased difficulty when managing weaving around objects and cones.  We will continue to work  in future sessions.  ?Heel raises with resistance 2x15 repetitions/ 9# weight Seated Therapeutic exercise Patient instructed he can complete at home with household objects as weights  ?Standing on Airex pad completing head turns to challenge stability  X3 minutes total with rest between head turns  Intermittent upper extremity support, contact-guard assist Therapeutic exercise "Pretty hard" towards end per patient report due to legs feeling tired, difficulty maintaining balance   ?Side step over to airex pad for external cue for step length  X 10 ea  UE assist  Cues to prevnet excessive UE usse    ?Leg press  ? ? BLE 85# x15 reps,  ?BLE 100# x12  ?Reps ?BLE x 115 x 10 reps    Able to exhibit good control and positioning;  ?Medium difficulty;  ?BLE heel press  40# x15 reps,    able to exhibit good ankle ROM and reports feeling some fatigue in calves;   ?      ?      ?      ?      ?Treatment Provided this session  ? ?Pt educated throughout session about proper posture and technique with exercises. Improved exercise technique, movement at target joints, use of target muscles after min to mod verbal, visual, tactile cues. ?Note: Portions of this document were prepared using Dragon voice recognition software and although reviewed may contain unintentional dictation errors in syntax, grammar, or spelling. ? ? ? ? ? ? ? ? ? ? ? ? ? ? ? ? ? ? ? ? ? ? ? ? ? ? ? ? ? ? ? PT Education - 10/18/21 1155   ? ? Education Details exercise technique   ? Person(s) Educated Patient   ? Methods Explanation;Demonstration   ? Comprehension Verbalized understanding;Returned demonstration   ? ?  ?  ? ?  ? ? ? PT Short Term Goals - 10/09/21 0820   ? ?  ? PT SHORT TERM GOAL #1  ? Title Patient will be adherent to HEP at least 3x a week to improve functional strength and balance for better safety at home.   ? Baseline 2/27: reports doing them 3-4x a week;   ? Time 4   ? Period Weeks   ? Status Achieved   ? Target Date 08/10/21   ?  ? PT SHORT  TERM GOAL #2  ? Title Patient will demonstrate improvement in foot/ankle strength by being able to complete BLE heel raise with minimal UE support for better gait safety and mobility;   ? Baseline 2/27: unable; 09/11/21: unable, good MMT strength, unable to lift off floor wihtout UE support   ? Time 4   ? Period Weeks   ? Status Not Met   ? Target Date 10/09/21   ? ?  ?  ? ?  ? ? ? ?  PT Long Term Goals - 10/09/21 7185   ? ?  ? PT LONG TERM GOAL #1  ? Title Patient will increase BLE gross strength to 4+/5  especially in hip as to improve functional strength for independent gait, increased standing tolerance and increased ADL ability.   ? Baseline 2/27: see flowsheet; 09/11/21: see flowsheet, remains limited significantly in ankle strength   ? Time 8   ? Period Weeks   ? Status Partially Met   ? Target Date 11/06/21   ?  ? PT LONG TERM GOAL #2  ? Title Patient will increase six minute walk test distance to >800 for progression toward community ambulator and improve gait ability   ? Baseline 2/27: 850 feet with RW   ? Time 8   ? Period Weeks   ? Status Achieved   ? Target Date 09/07/21   ?  ? PT LONG TERM GOAL #3  ? Title Patient will demonstrate an improved Berg Balance Score of > 42/56 as to demonstrate improved balance with ADLs such as sitting/standing and transfer balance and reduced fall risk.   ? Baseline 2/27: 25/56; 09/11/21: 37/56 4/24: 41/56   ? Time 8   ? Period Weeks   ? Status On-going   ? Target Date 11/06/21   ?  ? PT LONG TERM GOAL #4  ? Title Patient will reduce timed up and go to <14 seconds to reduce fall risk and demonstrate improved transfer/gait ability.   ? Baseline 2/27: 9.48 sec with RW;   ? Time 8   ? Period Weeks   ? Status Achieved   ? Target Date 09/07/21   ?  ? PT LONG TERM GOAL #5  ? Title Pt will be able to tolerate standing >/= 3 min without UE support and without LOB to demonstrate ability to complete grooming/bathing tasks and other standing ADL's without LOB.   ? Baseline 09/11/21: 1  min 45 sec 10/09/21: 3 min but 1 LOB wiht step correction and significant fatigue and LE trembling   ? Time 8   ? Period Weeks   ? Status Revised   ? Target Date 11/06/21   ? ?  ?  ? ?  ? ? ? ? ? ? ? ? P

## 2021-10-19 NOTE — Therapy (Signed)
[place] ?[facility] ?[address] ?[place], [place], [address] ?Phone: [phone]   Fax:  [phone] ? ?Occupational Therapy Treatment ? ?Patient Details  ?Name: Jerry Wheeler ?MRN: [id number] ?Date of Birth: 04-27-1976 ?Referring Provider (OT): Delle Reining, [place] ? ? ?Encounter Date: 10/18/2021 ? ? OT End of Session - 10/19/21 1006   ? ? Visit Number 21   ? Number of Visits 34   ? Date for OT Re-Evaluation 11/12/21   ? Authorization Time Period reporting period beginning 08/21/21   ? OT Start Time 1100   ? OT Stop Time 1145   ? OT Time Calculation (min) 45 min   ? Equipment Utilized During Treatment RW   ? Activity Tolerance Patient tolerated treatment well   ? Behavior During Therapy [facility] for tasks assessed/performed   ? ?  ?  ? ?  ? ? ?Past Medical History:  ?Diagnosis Date  ? Anxiety disorder   ? Bipolar disorder (HCC)   ? Diabetes mellitus (HCC)   ? Diabetic peripheral neuropathy associated with type 2 diabetes mellitus (HCC)   ? Emphysema lung (HCC)   ? hospitalization 1999  ? High blood pressure   ? 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 Morbid obesity (HCC)   ? Osteomyelitis of great toe of left foot (HCC)   ? Sleep apnea   ? Tobacco use disorder, continuous   ? Ulcer of right heel (HCC)   ? followed by podiatry--fully healed 09/2020  ? ? ?Past Surgical History:  ?Procedure Laterality Date  ? DG GREAT TOE LEFT FOOT  2010  ? s/p I and D for osteo  ? ? ?There were no vitals filed for this visit. ? ? Subjective Assessment - 10/18/21 1005   ? ? Subjective  Pt reports starting to work on the cane with PT.   ? Patient is accompanied by: Family member   ? Pertinent History poorly controlled T2DM with peripheral neuropathy, bipolar disorder, OSA who was admitted to [facility] on 05/26/21 with worsening of BLE weakness and  numbness for 3 weeks progressing to inability to walk x one day. Neurology consulted and MRI spine done revealing severe canal stenosis  L3/L4, moderate canal stenosis L2/L3 and mild to moderate canal stenosis with  moderate to severe left foraminal stenosis at L4/L5.   ? Limitations Muscle weakness, mild BUE tremors, impaired balance and gait, hx of falling, neuropathy in BLEs   ? Patient Stated Goals "I want to improve my balance so I can safely be in my kitchen to cook."   ? Currently in Pain? No/denies   ? Pain Score 0-No pain   ? Pain Onset More than a month ago   ? ?  ?  ? ?  ? ?Occupational Therapy Treatment: ?Therapeutic Exercise: ?Facilitated hand strengthening with use of hand gripper set at 28.9# to remove jumbo pegs from pegboard x2 trials each hand.  Facilitated pinch strengthening with use of therapy resistant clothespins to target lateral and 3 point pinch of R/L hands.  Completed 2 trials for each hand for each pinch type.   ? ?Therapeutic Activity: ?Participated in dynamic standing activity, bouncing catching ball with wide then narrow BOS, bouncing taking step forward and backward, alternating feet, and bouncing/catching with 1 foot slightly in front of the other.  Frequent balance checks with good ability to recover with unilateral support on either side (walker propped to each side of pt).   ? ?Response to Treatment: ?  Pt with good tolerance to bilat hand strengthening, working to increase grip and pinch for holding, carrying, manipulating ADL supplies with better control.  Pt reports he has started to work with PT on the cane.  Frequent sitting breaks needed with dynamic standing tasks and intermittent balance checks requiring brief unilateral support to recover.  Pt will continue to benefit from skilled OT for increasing activity tolerance and dynamic standing balance for better tolerance and safety with ADL/IADL/community outings.  ? ? ? OT Education - 10/18/21 1006   ? ? Education Details dynamic standing tasks   ? Person(s) Educated Patient   ? Methods Explanation;Verbal cues;Demonstration   ? Comprehension Verbalized  understanding;Returned demonstration;Verbal cues required   ? ?  ?  ? ?  ? ? ? OT Short Term Goals - 10/09/21 0918   ? ?  ? OT SHORT TERM GOAL #1  ? Title Pt will verbalize and demonstrate 3 fall prevention strategies to reduce fall risk with ADLs/IADLs.   ? Baseline Eval: not yet initiated; 08/21/21: identifies 3 strategies with min vc 10/09/2021:As pt. has improved with strength, and mobility, pt. is consistently using strateges forfall prevention.   ? Time 6   ? Period Weeks   ? Status Achieved   ?  ? OT SHORT TERM GOAL #2  ? Title Pt will verbalize 3 compensatory strategies to manage BUE tremors during ADL completion.   ? Baseline Eval: education needed; 08/21/21: Able to identify 2 strategies with min vc. 10/09/2021: Pt. is able to identify 2 compensatory strategies for tremors.   ? Time 6   ? Period Weeks   ? Status On-going   ? Target Date 11/20/21   ? ?  ?  ? ?  ? ? ? ? OT Long Term Goals - 10/09/21 0924   ? ?  ? OT LONG TERM GOAL #1  ? Title Pt will increase FOTO score to 60 or better to indicate increased functional performance.   ? Baseline Eval: FOTO 51; 08/21/21: 48.4, 10/09/2021: 53   ? Time 12   ? Period Weeks   ? Target Date 11/12/21   ?  ? OT LONG TERM GOAL #2  ? Title Pt will increase functional reach to >12" without UE support to decrease fall risk with functional reaching during daily tasks.   ? Baseline Eval: Functional reach 9" without AD, 13" with 1 hand on support surface; 08/21/21: 12" without AD, 14" with 1 hand on support surface.10/09/2021: 14" without hand support on the surface.   ? Time 12   ? Status On-going   ? Target Date 11/12/21   ?  ? OT LONG TERM GOAL #3  ? Title Pt will increase 360 degree turn test score to WNL (4 secs or less) without AD to indicate improved dynamic standing/decreased fall risk within small spaces for kitchen mobility.   ? Baseline Eval: 360 turn test score 7 sec with RW; 08/21/21: 4 sec with RW 10/09/2021: TBA   ? Time 12   ? Period Weeks   ? Status Deferred   ? Target  Date 11/12/21   ?  ? OT LONG TERM GOAL #4  ? Title Pt will be modified indep to perform hot meal prep at stove top.   ? Baseline Eval: Dep for hot meal prep, using microwave only and preparing cold meals d/t limited ability to release hands from walker; 08/21/21: Pt managing cold and hot LIGHT meal prep (microwaveable) with modified indep.10/09/2021: Pt. is able to  retrieve items from the oven, and uses the stovetop  to fry items in the frying pan.   ? Time 12   ? Period Weeks   ? Status On-going   ? Target Date 11/12/21   ?  ? OT LONG TERM GOAL #5  ? Title Pt will manage laundry tasks with modified indep.   ? Baseline Eval: max A; brother currently managing pt's laundry; 08/21/21: pt has not yet attempted laundry, but educated today on transport strategies to manage laundry up/down stairs of apartment 10/09/2021: Independent   ? Time 6   ? Period Weeks   ? Status Achieved   ? Target Date 11/12/21   ?  ? OT LONG TERM GOAL #6  ? Title Pt will increase bilat grip strength by 20 or more lbs to improve ability to hold and carry ADL supplies.   ? Baseline Eval: R grip strength 66 lbs, L 68 (~20 below normal range for pt's age and gender); 08/21/21: R grip 79, L grip 80 10/09/2021: R: 85, L: 80   ? Time 12   ? Period Weeks   ? Status On-going   ? Target Date 11/12/21   ? ?  ?  ? ?  ? ? Plan - 10/18/21 1032   ? ? Clinical Impression Statement Pt with good tolerance to bilat hand strengthening, working to increase grip and pinch for holding, carrying, manipulating ADL supplies with better control.  Pt reports he has started to work with PT on the cane.  Frequent sitting breaks needed with dynamic standing tasks and intermittent balance checks requiring brief unilateral support to recover.  Pt will continue to benefit from skilled OT for increasing activity tolerance and dynamic standing balance for better tolerance and safety with ADL/IADL/community outings.   ? OT Occupational Profile and History Problem Focused Assessment -  Including review of records relating to presenting problem   ? Occupational performance deficits (Please refer to evaluation for details): ADL's;IADL's   ? Body Structure / Function / Physical Skills ADL;Coordination;En

## 2021-10-23 ENCOUNTER — Ambulatory Visit: Payer: Medicare Other | Admitting: Physical Therapy

## 2021-10-23 ENCOUNTER — Ambulatory Visit: Payer: Medicare Other | Admitting: Occupational Therapy

## 2021-10-25 ENCOUNTER — Ambulatory Visit: Payer: Medicare Other | Admitting: Physical Therapy

## 2021-10-25 ENCOUNTER — Ambulatory Visit: Payer: Medicare Other | Admitting: Occupational Therapy

## 2021-10-25 ENCOUNTER — Encounter: Payer: Self-pay | Admitting: Occupational Therapy

## 2021-10-25 DIAGNOSIS — R2689 Other abnormalities of gait and mobility: Secondary | ICD-10-CM

## 2021-10-25 DIAGNOSIS — M6281 Muscle weakness (generalized): Secondary | ICD-10-CM

## 2021-10-25 DIAGNOSIS — R262 Difficulty in walking, not elsewhere classified: Secondary | ICD-10-CM

## 2021-10-25 DIAGNOSIS — R2681 Unsteadiness on feet: Secondary | ICD-10-CM

## 2021-10-25 DIAGNOSIS — R278 Other lack of coordination: Secondary | ICD-10-CM

## 2021-10-25 NOTE — Therapy (Signed)
[place] ?[facility] [place] [facility] ?[place], [place], [address] ?Phone: [phone]   Fax:  [phone] ? ?Physical Therapy Treatment ? ?Patient Details  ?Name: Jerry Wheeler ?MRN: [id number] ?Date of Birth: 06/26/1975 ?Referring Provider (PT): Reesa Chew PA/ Netty Starring PCP ? ? ?Encounter Date: 10/25/2021 ? ? PT End of Session - 10/25/21 1251   ? ? Visit Number 23   ? Number of Visits 23   ? Date for PT Re-Evaluation 11/06/21   ? Authorization Type medicare   ? PT Start Time 1145   ? PT Stop Time 0488   ? PT Time Calculation (min) 44 min   ? Equipment Utilized During Treatment Gait belt   ? Activity Tolerance Patient tolerated treatment well   ? Behavior During Therapy [facility] for tasks assessed/performed   ? ?  ?  ? ?  ? ? ?Past Medical History:  ?Diagnosis Date  ? Anxiety disorder   ? Bipolar disorder ([place])   ? Diabetes mellitus ([place])   ? Diabetic peripheral neuropathy associated with type 2 diabetes mellitus ([place])   ? Emphysema lung ([place])   ? hospitalization 1999  ? High blood pressure   ? Major depressive disorder in partial remission ([place])   ? Morbid obesity ([place])   ? Osteomyelitis of great toe of left foot ([place])   ? Sleep apnea   ? Tobacco use disorder, continuous   ? Ulcer of right heel ([place])   ? followed by podiatry--fully healed 09/2020  ? ? ?Past Surgical History:  ?Procedure Laterality Date  ? DG GREAT TOE LEFT FOOT  2010  ? s/p I and D for osteo  ? ? ?There were no vitals filed for this visit. ? ? Subjective Assessment - 10/25/21 1149   ? ? Subjective Pt reports no significant changes since last session.Pt reports going to a play last week that his cousin was in.   ? Pertinent History 46 yo Male admitted to the hospital 05/26/2021 - 06/01/2021 at [facility] with lower extremity weakness secondary to peripheral neuropathy and severe spinal stenosis seen on MRI on 05/26/2021. He was discharged to [facility] rehab 06/01/2021 - 06/09/2021. He was discharged to his  brother's home. Prior to weakness he was in a 2 story apartment with stairs to enter. Therefore he is currently living at his brothers home which is 2 story but living is on the main floor; he does have 4 steps to enter home with B railings; He reports navigating those steps well. He is currenlty ambulating with RW, able to take a few steps without walker. Reports falling on 12/9 prior to admittance but otherwise no new falls. He denies any significant numbness/tingling; He does have neuropathy in the feet from diabetes but he doesn't feel that it has worsened. He does have an appointment with Dr. Lacinda Axon (neurosurgeon); He is not sure that they will recommend surgery due to uncontrolled diabetes and concern that surgery may not improve condition;   ? How long can you sit comfortably? several hours   ? How long can you stand comfortably? Unsure- able to stand at least 5 min but hasn't tested it.   ? How long can you walk comfortably? 5-7 minutes   ? Diagnostic tests MRI on 05/26/21 IMPRESSION:  1. Multilevel lumbar spondylosis, as detailed above.  2. Severe canal stenosis at L3-L4.  3. Moderate canal stenosis at L2-L3.  4. Mild-to-moderate canal stenosis with moderate-severe left  foraminal stenosis at L4-L5.   ?  Patient Stated Goals "get back into my apartment- negotiate steps, walk without walker"   ? Currently in Pain? No/denies   ? ?  ?  ? ?  ? ? ?Exercise/Activity Sets/Reps/Time/ Resistance Assistance Charge type Comments  ?SLS progression 1 LE floor, 1 on step with ball hand off  2 x 5 ball hand offs each side  CGA intermittent UE assist  NMR Unable to complete with airex pad under feet, improved without pad   ?Ambulation with large-base quad cane X70 feet with sharp turns.  CGA Gait training Cues for proper sequencing.  With increased practice patient sequencing did improve but had increased difficulty when managing weaving around objects and cones.  We will continue to work in future sessions.  ?Balance obstacle  course - hurdle, steps, cones  X  round  No AD Therex  Rated easy   ?Standing on Airex pad completing head turns to challenge stability   2 x 20 sec vertical head turns and lateral head turns  Intermittent upper extremity support, contact-guard assist Therapeutic exercise "Pretty hard" towards end per patient report due to legs feeling tired, difficulty maintaining balance   ?Side step over to airex pad for external cue for step length  X 10 ea  UE assist  Cues to prevnet excessive UE usse  To improve dynamic weight shifting   ?Leg press  ? ? BLE 100# x15 reps,  ?BLE 115# x12  ?Reps ?BLE x 130 x 8 reps    Able to exhibit good control and positioning;  ?Medium difficulty;  ?BLE heel press  40# x15 reps,    able to exhibit good ankle ROM and reports feeling some fatigue in calves;   ?Outdoor walking on grass and on inclines ambulate across stable and unstable surface outside. Negotiating changing surfaces from grass to sidewalk, with turns and obstacles in pathway without LOB for practice with [facility].  ? ? ? ? ? ? ? ? ? ? ? ? ? ? ? ? ? ? ? ? ? ? ? ? ? ? ? ? ? ? PT Short Term Goals - 10/09/21 0820   ? ?  ? PT SHORT TERM GOAL #1  ? Title Patient will be adherent to HEP at least 3x a week to improve functional strength and balance for better safety at home.   ? Baseline 2/27: reports doing them 3-4x a week;   ? Time 4   ? Period Weeks   ? Status Achieved   ? Target Date 08/10/21   ?  ? PT SHORT TERM GOAL #2  ? Title Patient will demonstrate improvement in foot/ankle strength by being able to complete BLE heel raise with minimal UE support for better gait safety and mobility;   ? Baseline 2/27: unable; 09/11/21: unable, good MMT strength, unable to lift off floor wihtout UE support   ? Time 4   ? Period Weeks   ? Status Not Met   ? Target Date 10/09/21   ? ?  ?  ? ?  ? ? ? ? PT Long Term Goals - 10/09/21 0822   ? ?  ? PT LONG TERM GOAL #1  ? Title Patient will increase BLE gross strength to 4+/5  especially in hip as to  improve functional strength for independent gait, increased standing tolerance and increased ADL ability.   ? Baseline 2/27: see flowsheet; 09/11/21: see flowsheet, remains limited significantly in ankle strength   ? Time 8   ? Period Weeks   ?  Status Partially Met   ? Target Date 11/06/21   ?  ? PT LONG TERM GOAL #2  ? Title Patient will increase six minute walk test distance to >800 for progression toward community ambulator and improve gait ability   ? Baseline 2/27: 850 feet with RW   ? Time 8   ? Period Weeks   ? Status Achieved   ? Target Date 09/07/21   ?  ? PT LONG TERM GOAL #3  ? Title Patient will demonstrate an improved Berg Balance Score of > 42/56 as to demonstrate improved balance with ADLs such as sitting/standing and transfer balance and reduced fall risk.   ? Baseline 2/27: 25/56; 09/11/21: 37/56 4/24: 41/56   ? Time 8   ? Period Weeks   ? Status On-going   ? Target Date 11/06/21   ?  ? PT LONG TERM GOAL #4  ? Title Patient will reduce timed up and go to <14 seconds to reduce fall risk and demonstrate improved transfer/gait ability.   ? Baseline 2/27: 9.48 sec with RW;   ? Time 8   ? Period Weeks   ? Status Achieved   ? Target Date 09/07/21   ?  ? PT LONG TERM GOAL #5  ? Title Pt will be able to tolerate standing >/= 3 min without UE support and without LOB to demonstrate ability to complete grooming/bathing tasks and other standing ADL's without LOB.   ? Baseline 09/11/21: 1 min 45 sec 10/09/21: 3 min but 1 LOB wiht step correction and significant fatigue and LE trembling   ? Time 8   ? Period Weeks   ? Status Revised   ? Target Date 11/06/21   ? ?  ?  ? ?  ? ? ? ? ? ? ? ? Plan - 10/25/21 1252   ? ? Clinical Impression Statement Pt presents to PT with excellent motivation for completion of PT session. Pt progressed with [facility] as AD for activities today with turning as well as outdoor balance activities and with practice he demonstrated good gait mechanics with activities with these. Pt progressed with  intensity with leg press and tolerated well with less fatigue following compared to previous sessions. Pt will continue to benefit from skilled pt to impove his LE strengh, standing endurance, mobility and bal

## 2021-10-25 NOTE — Therapy (Signed)
[place] ?[facility] ?[address] ?[place], [place], [address] ?Phone: [phone]   Fax:  [phone] ? ?Occupational Therapy Treatment ? ?Patient Details  ?Name: Jerry Wheeler ?MRN: [id number] ?Date of Birth: [birth year] ?Referring Provider (OT): Delle Reining, [place] ? ? ?Encounter Date: 10/25/2021 ? ? OT End of Session - 10/25/21 1855   ? ? Visit Number 22   ? Number of Visits 34   ? Date for OT Re-Evaluation 11/12/21   ? Authorization Time Period reporting period beginning 08/21/21   ? OT Start Time 1101   ? OT Stop Time 1145   ? OT Time Calculation (min) 44 min   ? Equipment Utilized During Treatment RW   ? Activity Tolerance Patient tolerated treatment well   ? Behavior During Therapy [facility] for tasks assessed/performed   ? ?  ?  ? ?  ? ? ?Past Medical History:  ?Diagnosis Date  ? Anxiety disorder   ? Bipolar disorder (HCC)   ? Diabetes mellitus (HCC)   ? Diabetic peripheral neuropathy associated with type 2 diabetes mellitus (HCC)   ? Emphysema lung (HCC)   ? hospitalization 1999  ? High blood pressure   ? 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 Morbid obesity (HCC)   ? Osteomyelitis of great toe of left foot (HCC)   ? Sleep apnea   ? Tobacco use disorder, continuous   ? Ulcer of right heel (HCC)   ? followed by podiatry--fully healed 09/2020  ? ? ?Past Surgical History:  ?Procedure Laterality Date  ? DG GREAT TOE LEFT FOOT  2010  ? s/p I and D for osteo  ? ? ?There were no vitals filed for this visit. ? ? Subjective Assessment - 10/25/21 1853   ? ? Subjective  Pt reports he is doing well, plans to spend time with family this weekend and a cookout for Mother's Day, he may contribute by making homemade macaroni and cheese.   ? Pertinent History poorly controlled T2DM with peripheral neuropathy, bipolar disorder, OSA who was admitted to [facility] on 05/26/21 with worsening of BLE weakness and  numbness for 3 weeks progressing to inability to walk x one day.  Neurology consulted and MRI spine done revealing severe canal stenosis L3/L4, moderate canal stenosis L2/L3 and mild to moderate canal stenosis with  moderate to severe left foraminal stenosis at L4/L5.   ? Limitations Muscle weakness, mild BUE tremors, impaired balance and gait, hx of falling, neuropathy in BLEs   ? Patient Stated Goals "I want to improve my balance so I can safely be in my kitchen to cook."   ? Currently in Pain? No/denies   ? Pain Score 0-No pain   ? ?  ?  ? ?  ? ?Therapeutic Exercises:  ?Pt seen for strengthening exercises for grip with use of resistive hand gripper with White spring, performed with each hand with reaching in a variety planes of motion to place pegs back into container by maintaining sustained grip, 5th setting 28.9# for 20 reps each.   ?Increased to [facility] spring right hand 35# for 20 reps and then left hand 36# for 20 reps for one set without extended reach   ? ?Neuromuscular Reeducation: ? ?Manipulation of [place] discs, pt placing discs from container to grid, 1/2 board with right hand, 1/2 with left hand. ?Pt turning and flipping discs from one side to the other with focus on speed and dexterity of task.  Cues at  times for prehension patterns and especially with index finger on left side which he tends to avoid using during most tasks.   ?Pt performed one set with right hand for whole board and then left hand.   ?Last round, pt performed with use of bilateral UEs turning items simultaneously with cues.  ?Manipulation of Small pegs, 1/2 inch in size to pick up and place into grid, performance greater on right than left.  Increased tremors noted with task.  Pt instructed on translatory skills of the hand and then using the hand for storage.  Cues to move items back to fingertips from palm, greater difficulty on left.  ? ?Response to tx:  ?Pt progressing well with tasks.  Increased resistance of hand gripper with use of stronger spring this date for sustained gripping  patterns to challenge patient.  Pt with tremors bilaterally but greater on left hand than right.  Pt demonstrates difficulty with manipulation of smaller objects.  Tends to avoid using index finger with prehension patterns on left but able to incorporate when verbally cued by therapist.  Continue to work towards goals in plan of care to maximize safety and independence in necessary daily tasks.  ? ? ? ? ? ? ? ? ? ? ? ? ? ? ? ? ? ? ? ? ? OT Short Term Goals - 10/09/21 0918   ? ?  ? OT SHORT TERM GOAL #1  ? Title Pt will verbalize and demonstrate 3 fall prevention strategies to reduce fall risk with ADLs/IADLs.   ? Baseline Eval: not yet initiated; 08/21/21: identifies 3 strategies with min vc 10/09/2021:As pt. has improved with strength, and mobility, pt. is consistently using strateges forfall prevention.   ? Time 6   ? Period Weeks   ? Status Achieved   ?  ? OT SHORT TERM GOAL #2  ? Title Pt will verbalize 3 compensatory strategies to manage BUE tremors during ADL completion.   ? Baseline Eval: education needed; 08/21/21: Able to identify 2 strategies with min vc. 10/09/2021: Pt. is able to identify 2 compensatory strategies for tremors.   ? Time 6   ? Period Weeks   ? Status On-going   ? Target Date 11/20/21   ? ?  ?  ? ?  ? ? ? ? OT Long Term Goals - 10/09/21 0924   ? ?  ? OT LONG TERM GOAL #1  ? Title Pt will increase FOTO score to 60 or better to indicate increased functional performance.   ? Baseline Eval: FOTO 51; 08/21/21: 48.4, 10/09/2021: 53   ? Time 12   ? Period Weeks   ? Target Date 11/12/21   ?  ? OT LONG TERM GOAL #2  ? Title Pt will increase functional reach to >12" without UE support to decrease fall risk with functional reaching during daily tasks.   ? Baseline Eval: Functional reach 9" without AD, 13" with 1 hand on support surface; 08/21/21: 12" without AD, 14" with 1 hand on support surface.10/09/2021: 14" without hand support on the surface.   ? Time 12   ? Status On-going   ? Target Date 11/12/21   ?  ?  OT LONG TERM GOAL #3  ? Title Pt will increase 360 degree turn test score to WNL (4 secs or less) without AD to indicate improved dynamic standing/decreased fall risk within small spaces for kitchen mobility.   ? Baseline Eval: 360 turn test score 7 sec with RW; 08/21/21: 4 sec  with RW 10/09/2021: TBA   ? Time 12   ? Period Weeks   ? Status Deferred   ? Target Date 11/12/21   ?  ? OT LONG TERM GOAL #4  ? Title Pt will be modified indep to perform hot meal prep at stove top.   ? Baseline Eval: Dep for hot meal prep, using microwave only and preparing cold meals d/t limited ability to release hands from walker; 08/21/21: Pt managing cold and hot LIGHT meal prep (microwaveable) with modified indep.10/09/2021: Pt. is able to retrieve items from the oven, and uses the stovetop  to fry items in the frying pan.   ? Time 12   ? Period Weeks   ? Status On-going   ? Target Date 11/12/21   ?  ? OT LONG TERM GOAL #5  ? Title Pt will manage laundry tasks with modified indep.   ? Baseline Eval: max A; brother currently managing pt's laundry; 08/21/21: pt has not yet attempted laundry, but educated today on transport strategies to manage laundry up/down stairs of apartment 10/09/2021: Independent   ? Time 6   ? Period Weeks   ? Status Achieved   ? Target Date 11/12/21   ?  ? OT LONG TERM GOAL #6  ? Title Pt will increase bilat grip strength by 20 or more lbs to improve ability to hold and carry ADL supplies.   ? Baseline Eval: R grip strength 66 lbs, L 68 (~20 below normal range for pt's age and gender); 08/21/21: R grip 79, L grip 80 10/09/2021: R: 85, L: 80   ? Time 12   ? Period Weeks   ? Status On-going   ? Target Date 11/12/21   ? ?  ?  ? ?  ? ? ? ? ? ? ? ? Plan - 10/25/21 1855   ? ? Clinical Impression Statement Pt progressing well with tasks.  Increased resistance of hand gripper with use of stronger spring this date for sustained gripping patterns to challenge patient.  Pt with tremors bilaterally but greater on left hand than right.   Pt demonstrates difficulty with manipulation of smaller objects.  Tends to avoid using index finger with prehension patterns on left but able to incorporate when verbally cued by therapist.  Continue to work t

## 2021-10-30 ENCOUNTER — Ambulatory Visit: Payer: Medicare Other | Admitting: Physical Therapy

## 2021-10-30 ENCOUNTER — Ambulatory Visit: Payer: Medicare Other | Admitting: Occupational Therapy

## 2021-10-30 DIAGNOSIS — R278 Other lack of coordination: Secondary | ICD-10-CM

## 2021-10-30 DIAGNOSIS — M6281 Muscle weakness (generalized): Secondary | ICD-10-CM

## 2021-10-30 DIAGNOSIS — R2681 Unsteadiness on feet: Secondary | ICD-10-CM

## 2021-10-30 DIAGNOSIS — R2689 Other abnormalities of gait and mobility: Secondary | ICD-10-CM

## 2021-10-30 DIAGNOSIS — R262 Difficulty in walking, not elsewhere classified: Secondary | ICD-10-CM

## 2021-10-30 NOTE — Therapy (Signed)
[place] [facility] ?[place], [place], [address] ?Phone: [phone]   Fax:  [phone] ? ?Physical Therapy Treatment ? ?Patient Details  ?Name: Jerry Wheeler ?MRN: [id number] ?Date of Birth: [birth year] ?Referring Provider (PT): Reesa Chew PA/ Netty Starring PCP ? ? ?Encounter Date: 10/30/2021 ? ? PT End of Session - 10/30/21 0820   ? ? Visit Number 24   ? Number of Visits 47   corrected from previous recert  ? Date for PT Re-Evaluation 11/06/21   ? Authorization Type medicare   ? PT Start Time 0815   ? PT Stop Time 0844   ? PT Time Calculation (min) 29 min   ? Equipment Utilized During Treatment Gait belt   ? Activity Tolerance Patient tolerated treatment well   ? Behavior During Therapy [facility] for tasks assessed/performed   ? ?  ?  ? ?  ? ? ?Past Medical History:  ?Diagnosis Date  ? Anxiety disorder   ? Bipolar disorder ([place])   ? Diabetes mellitus ([place])   ? Diabetic peripheral neuropathy associated with type 2 diabetes mellitus ([place])   ? Emphysema lung ([place])   ? hospitalization 1999  ? High blood pressure   ? Major depressive disorder in partial remission ([place])   ? Morbid obesity ([place])   ? Osteomyelitis of great toe of left foot ([place])   ? Sleep apnea   ? Tobacco use disorder, continuous   ? Ulcer of right heel ([place])   ? followed by podiatry--fully healed 09/2020  ? ? ?Past Surgical History:  ?Procedure Laterality Date  ? DG GREAT TOE LEFT FOOT  2010  ? s/p I and D for osteo  ? ? ?There were no vitals filed for this visit. ? ? Subjective Assessment - 10/30/21 0816   ? ? Subjective Pt reports no significant changes since last session. Pt reports no LOB or stumbles. Reports he had a good time at a cookout over the weekend.   ? Pertinent History 46 yo Male admitted to the hospital 05/26/2021 - 06/01/2021 at [facility] with lower extremity weakness secondary to peripheral neuropathy and severe spinal stenosis seen on MRI on 05/26/2021. He was discharged to [facility]  rehab 06/01/2021 - 06/09/2021. He was discharged to his brother's home. Prior to weakness he was in a 2 story apartment with stairs to enter. Therefore he is currently living at his brothers home which is 2 story but living is on the main floor; he does have 4 steps to enter home with B railings; He reports navigating those steps well. He is currenlty ambulating with RW, able to take a few steps without walker. Reports falling on 12/9 prior to admittance but otherwise no new falls. He denies any significant numbness/tingling; He does have neuropathy in the feet from diabetes but he doesn't feel that it has worsened. He does have an appointment with Dr. Lacinda Axon (neurosurgeon); He is not sure that they will recommend surgery due to uncontrolled diabetes and concern that surgery may not improve condition;   ? How long can you sit comfortably? several hours   ? How long can you stand comfortably? Unsure- able to stand at least 5 min but hasn't tested it.   ? How long can you walk comfortably? 5-7 minutes   ? Diagnostic tests MRI on 05/26/21 IMPRESSION:  1. Multilevel lumbar spondylosis, as detailed above.  2. Severe canal stenosis at L3-L4.  3. Moderate canal stenosis at L2-L3.  4. Mild-to-moderate canal stenosis  with moderate-severe left  foraminal stenosis at L4-L5.   ? Patient Stated Goals "get back into my apartment- negotiate steps, walk without walker"   ? ?  ?  ? ?  ? ? ? ? ?Standing on Airex pad completing head turns to challenge stability   2 x 2 min  Intermittent upper extremity support, contact-guard assist NMR "Pretty hard" towards end per patient report due to legs feeling tired, difficulty maintaining balance, trembling noted in LE in quad and hip musculature   ?Side step over 1/2 foam roller to airex pad for external cue for step length  X 10 ea  UE assist NMR To improve dynamic weight shifting   ?Leg press  ? ? BLE 100# x15 reps,  ?BLE 115# x12  ?Reps ?BLE x 130 x 10 reps   therex Able to exhibit good  control and positioning;  ?Medium difficulty;  ?BLE heel press  40# x15 reps  ?55# x 15 reps   therex able to exhibit good ankle ROM and reports feeling some fatigue in calves;   ? ?LAQ with prolonged hold  X 10 x 8 seconds   therex Trembling noted in quad toward second 5-8 for last several repetitions   ? ? ?Pt educated throughout session about proper posture and technique with exercises. Improved exercise technique, movement at target joints, use of target muscles after min to mod verbal, visual, tactile cues. ?Note: Portions of this document were prepared using Dragon voice recognition software and although reviewed may contain unintentional dictation errors in syntax, grammar, or spelling. ? ?Pt session was cut a little short today due to pt arriving a few minutes late to scheduled appointment time ? ? ? ? ? ? ? ? ? ? ? ? ? ? ? ? ? ? ? ? ? ? ? PT Short Term Goals - 10/09/21 0820   ? ?  ? PT SHORT TERM GOAL #1  ? Title Patient will be adherent to HEP at least 3x a week to improve functional strength and balance for better safety at home.   ? Baseline 2/27: reports doing them 3-4x a week;   ? Time 4   ? Period Weeks   ? Status Achieved   ? Target Date 08/10/21   ?  ? PT SHORT TERM GOAL #2  ? Title Patient will demonstrate improvement in foot/ankle strength by being able to complete BLE heel raise with minimal UE support for better gait safety and mobility;   ? Baseline 2/27: unable; 09/11/21: unable, good MMT strength, unable to lift off floor wihtout UE support   ? Time 4   ? Period Weeks   ? Status Not Met   ? Target Date 10/09/21   ? ?  ?  ? ?  ? ? ? ? PT Long Term Goals - 10/09/21 0822   ? ?  ? PT LONG TERM GOAL #1  ? Title Patient will increase BLE gross strength to 4+/5  especially in hip as to improve functional strength for independent gait, increased standing tolerance and increased ADL ability.   ? Baseline 2/27: see flowsheet; 09/11/21: see flowsheet, remains limited significantly in ankle strength   ?  Time 8   ? Period Weeks   ? Status Partially Met   ? Target Date 11/06/21   ?  ? PT LONG TERM GOAL #2  ? Title Patient will increase six minute walk test distance to >800 for progression toward community ambulator and improve gait ability   ?  Baseline 2/27: 850 feet with RW   ? Time 8   ? Period Weeks   ? Status Achieved   ? Target Date 09/07/21   ?  ? PT LONG TERM GOAL #3  ? Title Patient will demonstrate an improved Berg Balance Score of > 42/56 as to demonstrate improved balance with ADLs such as sitting/standing and transfer balance and reduced fall risk.   ? Baseline 2/27: 25/56; 09/11/21: 37/56 4/24: 41/56   ? Time 8   ? Period Weeks   ? Status On-going   ? Target Date 11/06/21   ?  ? PT LONG TERM GOAL #4  ? Title Patient will reduce timed up and go to <14 seconds to reduce fall risk and demonstrate improved transfer/gait ability.   ? Baseline 2/27: 9.48 sec with RW;   ? Time 8   ? Period Weeks   ? Status Achieved   ? Target Date 09/07/21   ?  ? PT LONG TERM GOAL #5  ? Title Pt will be able to tolerate standing >/= 3 min without UE support and without LOB to demonstrate ability to complete grooming/bathing tasks and other standing ADL's without LOB.   ? Baseline 09/11/21: 1 min 45 sec 10/09/21: 3 min but 1 LOB wiht step correction and significant fatigue and LE trembling   ? Time 8   ? Period Weeks   ? Status Revised   ? Target Date 11/06/21   ? ?  ?  ? ?  ? ? ? ? ? ? ? ? Plan - 10/30/21 0820   ? ? Clinical Impression Statement Pt presents to PT with good motivation for copmletion of PT activities. Pt progressed with resisstance and and standing endurance exercises today with good efficacy but with conitnued quickness to fatigue with prolonged standing. Will continue to progress in future session toward these deficits. Pt will continue to benefit from skilled PT interventions in order to improve LE strength, endurance, balance and mobility.   ? Personal Factors and Comorbidities Comorbidity 3+;Fitness;Time since  onset of injury/illness/exacerbation   ? Comorbidities PMH Significant: Obesity, Anxiety/depression, bipolar disorder, Type 2 diabetes with neuropathy, insomnia, HTN, Lumbar stenosis   ? Examination-Activity

## 2021-10-30 NOTE — Therapy (Signed)
[facility] ?[place], [place], [address] ?Phone: [phone]   Fax:  [phone] ? ?Occupational Therapy Treatment ? ?Patient Details  ?Name: Jerry Wheeler ?MRN: [id number] ?Date of Birth: 02-22-1976 ?Referring Provider (OT): Reesa Chew, [place] ? ? ?Encounter Date: 10/30/2021 ? ? OT End of Session - 10/30/21 0844   ? ? Visit Number 23   ? Number of Visits 34   ? Date for OT Re-Evaluation 11/12/21   ? Authorization Time Period reporting period beginning 08/21/21   ? OT Start Time [phone]   ? OT Stop Time 0930   ? OT Time Calculation (min) 44 min   ? Equipment Utilized During Treatment RW   ? Activity Tolerance Patient tolerated treatment well   ? Behavior During Therapy [facility] for tasks assessed/performed   ? ?  ?  ? ?  ? ? ?Past Medical History:  ?Diagnosis Date  ? Anxiety disorder   ? Bipolar disorder ([place])   ? Diabetes mellitus ([place])   ? Diabetic peripheral neuropathy associated with type 2 diabetes mellitus ([place])   ? Emphysema lung ([place])   ? hospitalization 1999  ? High blood pressure   ? Major depressive disorder in partial remission ([place])   ? Morbid obesity ([place])   ? Osteomyelitis of great toe of left foot ([place])   ? Sleep apnea   ? Tobacco use disorder, continuous   ? Ulcer of right heel ([place])   ? followed by podiatry--fully healed 09/2020  ? ? ?Past Surgical History:  ?Procedure Laterality Date  ? DG GREAT TOE LEFT FOOT  2010  ? s/p I and D for osteo  ? ? ?There were no vitals filed for this visit. ? ? Subjective Assessment - 10/30/21 0843   ? ? Subjective  Pt reports enjoying cooking but did not assist with his family's mothers day cookout this weekend   ? Patient is accompanied by: --   ? Pertinent History poorly controlled T2DM with peripheral neuropathy, bipolar disorder, OSA who was admitted to [facility] on 05/26/21 with worsening of BLE weakness and  numbness for 3 weeks progressing to inability to walk x one day. Neurology consulted and MRI spine  done revealing severe canal stenosis L3/L4, moderate canal stenosis L2/L3 and mild to moderate canal stenosis with  moderate to severe left foraminal stenosis at L4/L5.   ? Limitations Muscle weakness, mild BUE tremors, impaired balance and gait, hx of falling, neuropathy in BLEs   ? Patient Stated Goals "I want to improve my balance so I can safely be in my kitchen to cook."   ? Currently in Pain? No/denies   ? Pain Onset More than a month ago   ? ?  ?  ? ?  ? ? ? ?Therapeutic Exercise ? Pt completed 7 lb digi-flex for bilateral hand finger strengthening, cues for digit isolation. Pt performed gross gripping with a gross grip strengthener. Pt worked on sustaining grip while grasping pegs and reaching at various heights. The Gripper was set to  35 # of grip strength resistance - black spring in 3rd hole.  ? ?Therapeutic Activity ?Pt tolerated standing ball throwing/catching exercise for 4 min 50sec reaching outside BOS, requires small stagger step to catch balance with balls thrown at above head height. 2nd trial 4 min 40 sec for bounce passes, minor LOBs to generate forceful passes. 3rd trial 4 min 36 sec alternating 1 and 2 hand bounce passes.  ? ? ? ? ? ? ? ?  OT Education - 10/30/21 0844   ? ? Education Details HEP for home balance tasks   ? Person(s) Educated Patient   ? Methods Explanation;Verbal cues;Demonstration   ? Comprehension Verbalized understanding;Returned demonstration;Verbal cues required   ? ?  ?  ? ?  ? ? ? OT Short Term Goals - 10/09/21 0918   ? ?  ? OT SHORT TERM GOAL #1  ? Title Pt will verbalize and demonstrate 3 fall prevention strategies to reduce fall risk with ADLs/IADLs.   ? Baseline Eval: not yet initiated; 08/21/21: identifies 3 strategies with min vc 10/09/2021:As pt. has improved with strength, and mobility, pt. is consistently using strateges forfall prevention.   ? Time 6   ? Period Weeks   ? Status Achieved   ?  ? OT SHORT TERM GOAL #2  ? Title Pt will verbalize 3 compensatory  strategies to manage BUE tremors during ADL completion.   ? Baseline Eval: education needed; 08/21/21: Able to identify 2 strategies with min vc. 10/09/2021: Pt. is able to identify 2 compensatory strategies for tremors.   ? Time 6   ? Period Weeks   ? Status On-going   ? Target Date 11/20/21   ? ?  ?  ? ?  ? ? ? ? OT Long Term Goals - 10/09/21 0924   ? ?  ? OT LONG TERM GOAL #1  ? Title Pt will increase FOTO score to 60 or better to indicate increased functional performance.   ? Baseline Eval: FOTO 51; 08/21/21: 48.4, 10/09/2021: 53   ? Time 12   ? Period Weeks   ? Target Date 11/12/21   ?  ? OT LONG TERM GOAL #2  ? Title Pt will increase functional reach to >12" without UE support to decrease fall risk with functional reaching during daily tasks.   ? Baseline Eval: Functional reach 9" without AD, 13" with 1 hand on support surface; 08/21/21: 12" without AD, 14" with 1 hand on support surface.10/09/2021: 14" without hand support on the surface.   ? Time 12   ? Status On-going   ? Target Date 11/12/21   ?  ? OT LONG TERM GOAL #3  ? Title Pt will increase 360 degree turn test score to WNL (4 secs or less) without AD to indicate improved dynamic standing/decreased fall risk within small spaces for kitchen mobility.   ? Baseline Eval: 360 turn test score 7 sec with RW; 08/21/21: 4 sec with RW 10/09/2021: TBA   ? Time 12   ? Period Weeks   ? Status Deferred   ? Target Date 11/12/21   ?  ? OT LONG TERM GOAL #4  ? Title Pt will be modified indep to perform hot meal prep at stove top.   ? Baseline Eval: Dep for hot meal prep, using microwave only and preparing cold meals d/t limited ability to release hands from walker; 08/21/21: Pt managing cold and hot LIGHT meal prep (microwaveable) with modified indep.10/09/2021: Pt. is able to retrieve items from the oven, and uses the stovetop  to fry items in the frying pan.   ? Time 12   ? Period Weeks   ? Status On-going   ? Target Date 11/12/21   ?  ? OT LONG TERM GOAL #5  ? Title Pt will  manage laundry tasks with modified indep.   ? Baseline Eval: max A; brother currently managing pt's laundry; 08/21/21: pt has not yet attempted laundry, but educated today on  transport strategies to manage laundry up/down stairs of apartment 10/09/2021: Independent   ? Time 6   ? Period Weeks   ? Status Achieved   ? Target Date 11/12/21   ?  ? OT LONG TERM GOAL #6  ? Title Pt will increase bilat grip strength by 20 or more lbs to improve ability to hold and carry ADL supplies.   ? Baseline Eval: R grip strength 66 lbs, L 68 (~20 below normal range for pt's age and gender); 08/21/21: R grip 79, L grip 80 10/09/2021: R: 85, L: 80   ? Time 12   ? Period Weeks   ? Status On-going   ? Target Date 11/12/21   ? ?  ?  ? ?  ? ? ? ? ? ? ? ? Plan - 10/30/21 0844   ? ? Clinical Impression Statement Pt continues to demostrate BUE tremors as pt fatigues. Improved tolerance for standing balance tasks for 3 trials of >4 min 30sec each to cath/pass ball. Demonstrates multiple minor LOBs requiring stagger steps or single UE support to correct, good insight and safety awareness. Continue to work towards goals in plan of care to maximize safety and independence in necessary daily tasks.   ? OT Occupational Profile and History Problem Focused Assessment - Including review of records relating to presenting problem   ? Occupational performance deficits (Please refer to evaluation for details): ADL's;IADL's   ? Body Structure / Function / Physical Skills ADL;Coordination;Endurance;GMC;UE functional use;Balance;Sensation;Body mechanics;Decreased knowledge of use of DME;IADL;Pain;Dexterity;FMC;Strength;Gait;Mobility   ? Rehab Potential Good   ? Clinical Decision Making Limited treatment options, no task modification necessary   ? Comorbidities Affecting Occupational Performance: May have comorbidities impacting occupational performance   ? Modification or Assistance to Complete Evaluation  No modification of tasks or assist necessary to complete  eval   ? OT Duration 12 weeks   ? OT Treatment/Interventions Self-care/ADL training;Therapeutic exercise;DME and/or AE instruction;Functional Mobility Training;Balance training;Energy conservation;Therapeutic acti

## 2021-11-01 ENCOUNTER — Ambulatory Visit: Payer: Medicare Other | Admitting: Physical Therapy

## 2021-11-01 ENCOUNTER — Ambulatory Visit: Payer: Medicare Other | Admitting: Occupational Therapy

## 2021-11-06 ENCOUNTER — Ambulatory Visit: Payer: Medicare Other | Admitting: Physical Therapy

## 2021-11-06 ENCOUNTER — Ambulatory Visit: Payer: Medicare Other | Admitting: Occupational Therapy

## 2021-11-06 ENCOUNTER — Other Ambulatory Visit: Payer: Self-pay | Admitting: Neurology

## 2021-11-06 DIAGNOSIS — E1349 Other specified diabetes mellitus with other diabetic neurological complication: Secondary | ICD-10-CM

## 2021-11-08 ENCOUNTER — Ambulatory Visit: Payer: Medicare Other | Admitting: Occupational Therapy

## 2021-11-08 ENCOUNTER — Ambulatory Visit: Payer: Medicare Other | Admitting: Physical Therapy

## 2021-11-14 ENCOUNTER — Ambulatory Visit: Payer: Medicare Other | Admitting: Physical Therapy

## 2021-11-14 ENCOUNTER — Ambulatory Visit: Payer: Medicare Other

## 2021-11-14 VITALS — BP 155/81 | HR 104

## 2021-11-14 DIAGNOSIS — R278 Other lack of coordination: Secondary | ICD-10-CM

## 2021-11-14 DIAGNOSIS — M6281 Muscle weakness (generalized): Secondary | ICD-10-CM

## 2021-11-14 DIAGNOSIS — R2689 Other abnormalities of gait and mobility: Secondary | ICD-10-CM

## 2021-11-14 DIAGNOSIS — R2681 Unsteadiness on feet: Secondary | ICD-10-CM | POA: Diagnosis not present

## 2021-11-14 DIAGNOSIS — R269 Unspecified abnormalities of gait and mobility: Secondary | ICD-10-CM

## 2021-11-14 DIAGNOSIS — R262 Difficulty in walking, not elsewhere classified: Secondary | ICD-10-CM

## 2021-11-14 NOTE — Therapy (Addendum)
[facility] [address] [place], [place], [address] Phone: [phone]   Fax:  [phone]  Physical Therapy Treatment/ [facility]   Patient Details  Name: Jerry Wheeler MRN: [id number] Date of Birth: 08/03/1975 Referring Provider (PT): Pamela Love PA/ [place] PCP   Encounter Date: 11/14/2021   PT End of Session - 11/14/21 0906     Visit Number 25    Number of Visits 47   corrected from previous recert   Date for PT Re-Evaluation 11/06/21    Authorization Type medicare    PT Start Time 0815    PT Stop Time 0845    PT Time Calculation (min) 30 min    Equipment Utilized During Treatment Gait belt    Activity Tolerance Patient tolerated treatment well    Behavior During Therapy WFL for tasks assessed/performed             Past Medical History:  Diagnosis Date   Anxiety disorder    Bipolar disorder (HCC)    Diabetes mellitus (HCC)    Diabetic peripheral neuropathy associated with type 2 diabetes mellitus (HCC)    Emphysema lung (HCC)    hospitalization 1999   High blood pressure    Major depressive disorder in partial remission (HCC)    Morbid obesity (HCC)    Osteomyelitis of great toe of left foot (HCC)    Sleep apnea    Tobacco use disorder, continuous    Ulcer of right heel (HCC)    followed by podiatry--fully healed 09/2020    Past Surgical History:  Procedure Laterality Date   DG GREAT TOE LEFT FOOT  2010   s/p I and D for osteo    Vitals:   11/14/21 0904  BP: (!) 155/81  Pulse: (!) 104     Subjective Assessment - 11/14/21 0904     Subjective Pt reports feeling better from recent illness but has some dizziness today upon arrival to therapy session.    Pertinent History 46 yo Male admitted to the hospital 05/26/2021 - 06/01/2021 at [facility] with lower extremity weakness secondary to peripheral neuropathy and severe spinal stenosis seen on MRI on 05/26/2021. He was discharged to  rehab  06/01/2021 - 06/09/2021. He was discharged to his brother's home. Prior to weakness he was in a 2 story apartment with stairs to enter. Therefore he is currently living at his brothers home which is 2 story but living is on the main floor; he does have 4 steps to enter home with B railings; He reports navigating those steps well. He is currenlty ambulating with RW, able to take a few steps without walker. Reports falling on 12/9 prior to admittance but otherwise no new falls. He denies any significant numbness/tingling; He does have neuropathy in the feet from diabetes but he doesn't feel that it has worsened. He does have an appointment with Dr. Cook (neurosurgeon); He is not sure that they will recommend surgery due to uncontrolled diabetes and concern that surgery may not improve condition;    How long can you sit comfortably? several hours    How long can you stand comfortably? Unsure- able to stand at least 5 min but hasn't tested it.    How long can you walk comfortably? 5-7 minutes    Diagnostic tests MRI on 05/26/21 IMPRESSION:  1. Multilevel lumbar spondylosis, as detailed above.  2. Severe canal stenosis at L3-L4.  3. Moderate canal stenosis at L2-L3.  4. Mild-to-moderate canal stenosis   with moderate-severe left  foraminal stenosis at L4-L5.    Patient Stated Goals "get back into my apartment- negotiate steps, walk without walker"               Exercise   Time/reps/resistance Assistance  Charge type  Nustep  Level 3 x 2 min  Level 4 x 2 min  Level 5 x 2 min  Setup, managing resistance change intervals  Therex   To improve LE endurance and strength   Step over forward and backward foam roller Prolonged stance on airex  X 10 ea   X 1 minute  UE assist  No UE assistance NMR To improve retro walking and foot spacing. Reduced errors with practice.    To improve standing endurance and ankle muscle activation for balance   Leg press    BLE 100# x15 reps,  BLE 115# x12  Reps BLE x  130 x 10 reps   therex Able to exhibit good control and positioning;  Medium difficulty;  BLE heel press    55# x 15 reps x 2 sets   therex able to exhibit good ankle ROM and reports feeling some fatigue in calves;     Rationale for Evaluation and Treatment Rehabilitation  Pt educated throughout session about proper posture and technique with exercises. Improved exercise technique, movement at target joints, use of target muscles after min to mod verbal, visual, tactile cues.  Note: Portions of this document were prepared using Dragon voice recognition software and although reviewed may contain unintentional dictation errors in syntax, grammar, or spelling.  Pt session was cut a little short today due to pt arriving a few minutes late to scheduled appointment time secondary to transportation issues.                             PT Education - 11/14/21 0906     Education Details exercise technique    Person(s) Educated Patient    Methods Explanation;Demonstration    Comprehension Verbalized understanding;Returned demonstration              PT Short Term Goals - 11/14/21 1112       PT SHORT TERM GOAL #1   Title Patient will be adherent to HEP at least 3x a week to improve functional strength and balance for better safety at home.    Baseline 2/27: reports doing them 3-4x a week;    Time 4    Period Weeks    Status Achieved    Target Date 08/10/21      PT SHORT TERM GOAL #2   Title Patient will demonstrate improvement in foot/ankle strength by being able to complete BLE heel raise with minimal UE support for better gait safety and mobility;    Baseline 2/27: unable; 09/11/21: unable, good MMT strength, unable to lift off floor without UE support    Time 4    Period Weeks    Status Not Met    Target Date 12/12/21               PT Long Term Goals - 11/14/21 1112       PT LONG TERM GOAL #1   Title Patient will increase BLE gross strength to  4+/5  especially in hip as to improve functional strength for independent gait, increased standing tolerance and increased ADL ability.    Baseline 2/27: see flowsheet; 09/11/21: see flowsheet, remains limited significantly in ankle strength  Time 8    Period Weeks    Status Partially Met    Target Date 01/09/22      PT LONG TERM GOAL #2   Title Patient will increase six minute walk test distance to >800 for progression toward community ambulator and improve gait ability    Baseline 2/27: 850 feet with RW    Time 8    Period Weeks    Status Achieved    Target Date 09/07/21      PT LONG TERM GOAL #3   Title Patient will demonstrate an improved Berg Balance Score of > 42/56 as to demonstrate improved balance with ADLs such as sitting/standing and transfer balance and reduced fall risk.    Baseline 2/27: 25/56; 09/11/21: 37/56 4/24: 41/56    Time 8    Period Weeks    Status On-going    Target Date 01/09/22      PT LONG TERM GOAL #4   Title Patient will reduce timed up and go to <14 seconds to reduce fall risk and demonstrate improved transfer/gait ability.    Baseline 2/27: 9.48 sec with RW;    Time 8    Period Weeks    Status Achieved    Target Date 09/07/21      PT LONG TERM GOAL #5   Title Pt will be able to tolerate standing >/= 3 min without UE support and without LOB to demonstrate ability to complete grooming/bathing tasks and other standing ADL's without LOB.    Baseline 09/11/21: 1 min 45 sec 10/09/21: 3 min but 1 LOB with step correction and significant fatigue and LE trembling    Time 8    Period Weeks    Status Revised    Target Date 01/09/22                   Plan - 11/14/21 1106     Clinical Impression Statement Patient presents to physical therapy for recertification note. Pt goals were reassessed a few session prior and information following was  utilized from testing during the progress note visit. Patient has made significant progress with his Berg  balance scores as well as with his ankle strength testing. His Berg balance scale score indicates decreased risk of falls, however, patient still at high risk for falls based on scoring on this examination. Patient is able to stand for 3 minutes is partially met this goal but patient did have significant instability and fatigue toward end of 3 minutes of prolonged standing without upper extremity support. Patient has made progress with cane ambulation but still has not looked into getting a quad cane for commnuity ambulation. He reports he does not utilize the walker as he needs it and he usually ends up carrying it from place to place. Patient also does not utilize assistive device inside his home at this time. Patient still unable to perform standing heel raise indicating significant ankle weakness patient does have improved ankle strength based on manual muscle testing performed this session.Pt is progressing with ankle strength with interventions compelted this session but still hasdifficulty with reaching end range plantar flexion with resistance.  Patient will continue to benefit from skilled physical therapy intervention in order to improve his lower extremity strength, balance, mobility, to prevent falls and improve his overall quality of life and mobility.    Personal Factors and Comorbidities Comorbidity 3+;Fitness;Time since onset of injury/illness/exacerbation    Comorbidities PMH Significant: Obesity, Anxiety/depression, bipolar disorder, Type 2 diabetes with neuropathy,   insomnia, HTN, Lumbar stenosis    Examination-Activity Limitations Caring for Others;Carry;Locomotion Level;Squat;Stairs;Stand;Transfers    Examination-Participation Restrictions Cleaning;Community Activity;Driving;Occupation;Shop;Volunteer;Yard Work    Stability/Clinical Decision Making Stable/Uncomplicated    Rehab Potential Fair    PT Frequency 2x / week    PT Duration 8 weeks    PT Treatment/Interventions ADLs/Self Care  Home Management;Cryotherapy;Moist Heat;Gait training;Stair training;Functional mobility training;Therapeutic activities;Therapeutic exercise;Balance training;Neuromuscular re-education;Patient/family education;Orthotic Fit/Training;Manual techniques    PT Next Visit Plan Continue with strength/balance    PT Home Exercise Plan 07/26/2021=Access Code: [email or web address]; no updates    Consulted and Agree with Plan of Care Patient             Patient will benefit from skilled therapeutic intervention in order to improve the following deficits and impairments:  Abnormal gait, Decreased balance, Decreased endurance, Decreased mobility, Difficulty walking, Impaired sensation, Obesity, Increased edema, Decreased activity tolerance, Decreased safety awareness, Decreased strength  Visit Diagnosis: Abnormality of gait and mobility  Difficulty in walking, not elsewhere classified  Other abnormalities of gait and mobility     Problem List Patient Active Problem List   Diagnosis Date Noted   Lumbar disc disorder with myelopathy 06/01/2021   Unsteady gait 05/27/2021   Fall at home, initial encounter 05/27/2021   Lower extremity weakness 05/26/2021   Obesity (BMI 35.0-39.9 without comorbidity) 05/26/2021   Anxiety 09/12/2017   Bipolar disorder (HCC) 09/12/2017   Insulin dependent diabetes mellitus 09/12/2017   Primary insomnia 09/12/2017   Pure hypercholesterolemia 09/12/2017   Recurrent major depressive disorder, in partial remission (HCC) 09/12/2017   Tobacco dependence 09/12/2017   Vitamin D deficiency 09/12/2017   DM type 2 with diabetic peripheral neuropathy (HCC) 04/06/2016   Hypercalcemia 09/13/2015   Hypogonadism in male 09/13/2015   Non morbid obesity due to excess calories 09/13/2015     B , PT 11/14/2021, 11:18 AM  [facility] [address] [place], [place], [address] Phone: [phone]   Fax:   [phone]  Name: Harriet Dercole MRN: [id number] Date of Birth: 04/01/1976    

## 2021-11-15 NOTE — Therapy (Signed)
[place] MAIN [facility] SERVICES [address] [place], [place], [address] Phone: [phone]   Fax:  [phone]  Occupational Therapy Discharge  Patient Details  Name: Jerry Wheeler MRN: [id number] Date of Birth: [birth year] Referring Provider (OT): Reesa Chew, [place]   Encounter Date: 11/14/2021   OT End of Session - 11/15/21 0803     Visit Number 24    Number of Visits 34    Date for OT Re-Evaluation 11/12/21    Authorization Time Period reporting period beginning 08/21/21    OT Start Time 0930    OT Stop Time 1015    OT Time Calculation (min) 45 min    Equipment Utilized During Treatment RW    Activity Tolerance Patient tolerated treatment well    Behavior During Therapy WFL for tasks assessed/performed             Past Medical History:  Diagnosis Date   Anxiety disorder    Bipolar disorder ([place])    Diabetes mellitus ([place])    Diabetic peripheral neuropathy associated with type 2 diabetes mellitus ([place])    Emphysema lung ([place])    hospitalization 1999   High blood pressure    Major depressive disorder in partial remission ([place])    Morbid obesity ([place])    Osteomyelitis of great toe of left foot ([place])    Sleep apnea    Tobacco use disorder, continuous    Ulcer of right heel ([place])    followed by podiatry--fully healed 09/2020    Past Surgical History:  Procedure Laterality Date   DG GREAT TOE LEFT FOOT  2010   s/p I and D for osteo    There were no vitals filed for this visit.   Subjective Assessment - 11/14/21 0801     Subjective  Pt reports being ill last week, but feeling better today.    Patient is accompanied by: Family member    Pertinent History poorly controlled T2DM with peripheral neuropathy, bipolar disorder, OSA who was admitted to [facility] on 05/26/21 with worsening of BLE weakness and  numbness for 3 weeks progressing to inability to walk x one day. Neurology consulted and MRI spine done revealing severe canal  stenosis L3/L4, moderate canal stenosis L2/L3 and mild to moderate canal stenosis with  moderate to severe left foraminal stenosis at L4/L5.    Limitations Muscle weakness, mild BUE tremors, impaired balance and gait, hx of falling, neuropathy in BLEs    Patient Stated Goals "I want to improve my balance so I can safely be in my kitchen to cook."    Currently in Pain? No/denies    Pain Score 0-No pain    Pain Onset More than a month ago                [facility] OT Assessment - 11/15/21 0001       Observation/Other Assessments   Focus on Therapeutic Outcomes (FOTO)  64    Standing Functional Reach Test 11" without AD, 15" with 1 hand on RW for support    Outcome Measures 360 degree turn test: 3 sec without AD      Coordination   Right 9 Hole Peg Test 34    Left 9 Hole Peg Test 53      Hand Function   Right Hand Grip (lbs) 81    Right Hand Lateral Pinch 14 lbs    Right Hand 3 Point Pinch 12 lbs    Left Hand Grip (lbs) 74  Left Hand Lateral Pinch 15 lbs    Left 3 point pinch 18 lbs            Occupational Therapy Treatment/Discharge: Therapeutic Exercise: Objective measures taken, goals updated, HEP reviewed.  Pt indep with HEP.  Fall prevention strategies reviewed, including making slow positional changes, using unilateral support when reaching, turning lights on when up at night, wearing non-skid footwear, keeping pathways clear.  Pt indep with fall prevention strategies.   Response to Treatment: Pt has been seen for 24 OT sessions to address BUE strength, balance, compensatory strategies to manage tremors during ADLs, and fall prevention strategies.  OT reviewed adapted silverware and educated on options to obtain if desired, as well as reviewed WB to BUEs on table top to achieve more distal control during FM tasks.  Pt has sufficiently met all goals.  Pt is managing all BADL and IADL within his town home.  Pt will continue to address balance and mobility deficits with  additional PT sessions.  No additional skilled OT indicated at this time.     OT Education - 11/14/21 0802     Education Details fall prevention, HEP review    Person(s) Educated Patient    Methods Explanation;Verbal cues    Comprehension Verbalized understanding              OT Short Term Goals - 11/14/21 0803       OT SHORT TERM GOAL #1   Title Pt will verbalize and demonstrate 3 fall prevention strategies to reduce fall risk with ADLs/IADLs.    Baseline Eval: not yet initiated; 08/21/21: identifies 3 strategies with min vc 10/09/2021:As pt. has improved with strength, and mobility, pt. is consistently using strateges forfall prevention; 11/14/21 d/c: indep with fall prevention strategies    Time 6    Period Weeks    Status Achieved    Target Date 10/02/21      OT SHORT TERM GOAL #2   Title Pt will verbalize 3 compensatory strategies to manage BUE tremors during ADL completion.    Baseline Eval: education needed; 08/21/21: Able to identify 2 strategies with min vc. 10/09/2021: Pt. is able to identify 2 compensatory strategies for tremors; 11/14/21 d/c: indep with compensatory strategies to manage tremors during ADLs.    Time 6    Period Weeks    Status Achieved    Target Date 11/20/21               OT Long Term Goals - 11/14/21 0953       OT LONG TERM GOAL #1   Title Pt will increase FOTO score to 60 or better to indicate increased functional performance.    Baseline Eval: FOTO 51; 08/21/21: 48.4, 10/09/2021: 53; 11/14/21 d/c: 64    Time 12    Period Weeks    Status Achieved    Target Date 11/12/21      OT LONG TERM GOAL #2   Title Pt will increase functional reach to >12" without UE support to decrease fall risk with functional reaching during daily tasks.    Baseline Eval: Functional reach 9" without AD, 13" with 1 hand on support surface; 08/21/21: 12" without AD, 14" with 1 hand on support surface.10/09/2021: 14" without hand support on the surface; 11/14/21 d/c: 11"  without support, 15" with unilateral support; pt uses unilateral support at home when reaching    Time 12    Status Partially Met    Target Date 11/12/21  OT LONG TERM GOAL #3   Title Pt will increase 360 degree turn test score to WNL (4 secs or less) without AD to indicate improved dynamic standing/decreased fall risk within small spaces for kitchen mobility.    Baseline Eval: 360 turn test score 7 sec with RW; 08/21/21: 4 sec with RW 10/09/2021: TBA; 11/14/21 d/c: 3 sec without AD    Time 12    Period Weeks    Status Achieved    Target Date 11/12/21      OT LONG TERM GOAL #4   Title Pt will be modified indep to perform hot meal prep at stove top.    Baseline Eval: Dep for hot meal prep, using microwave only and preparing cold meals d/t limited ability to release hands from walker; 08/21/21: Pt managing cold and hot LIGHT meal prep (microwaveable) with modified indep.10/09/2021: Pt. is able to retrieve items from the oven, and uses the stovetop  to fry items in the frying pan; 11/14/21 d/c: modified indep    Time 12    Period Weeks    Status Achieved    Target Date 11/12/21      OT LONG TERM GOAL #5   Title Pt will manage laundry tasks with modified indep.    Baseline Eval: max A; brother currently managing pt's laundry; 08/21/21: pt has not yet attempted laundry, but educated today on transport strategies to manage laundry up/down stairs of apartment 10/09/2021: Independent    Time 6    Period Weeks    Status Achieved    Target Date 11/12/21      OT LONG TERM GOAL #6   Title Pt will increase bilat grip strength by 20 or more lbs to improve ability to hold and carry ADL supplies.    Baseline Eval: R grip strength 66 lbs, L 68 (~20 below normal range for pt's age and gender); 08/21/21: R grip 79, L grip 80 10/09/2021: R: 85, L: 80; 11/14/21 d/c: R 81, L 74 (pt has theraputty and is indep with grip strengthening exercises to complete at home)    Time 12    Period Weeks    Status Partially Met     Target Date 11/12/21               Plan - 11/14/21 7622     Clinical Impression Statement Pt has been seen for 24 OT sessions to address BUE strength, balance, compensatory strategies to manage tremors during ADLs, and fall prevention strategies.  OT reviewed adapted silverware and educated on options to obtain if desired, as well as reviewed WB to BUEs on table top to achieve more distal control during FM tasks.  Pt has sufficiently met all goals.  Pt is managing all BADL and IADL within his town home.  Pt will continue to address balance and mobility deficits with additional PT sessions.  No additional skilled OT indicated at this time.    OT Occupational Profile and History Problem Focused Assessment - Including review of records relating to presenting problem    Occupational performance deficits (Please refer to evaluation for details): ADL's;IADL's    Body Structure / Function / Physical Skills ADL;Coordination;Endurance;GMC;UE functional use;Balance;Sensation;Body mechanics;Decreased knowledge of use of DME;IADL;Pain;Dexterity;FMC;Strength;Gait;Mobility    Rehab Potential Good    Clinical Decision Making Limited treatment options, no task modification necessary    Comorbidities Affecting Occupational Performance: May have comorbidities impacting occupational performance    Modification or Assistance to Complete Evaluation  No modification of tasks or  assist necessary to complete eval    OT Duration 12 weeks    OT Treatment/Interventions Self-care/ADL training;Therapeutic exercise;DME and/or AE instruction;Functional Mobility Training;Balance training;Energy conservation;Therapeutic activities;Patient/family education    Consulted and Agree with Plan of Care Patient             Patient will benefit from skilled therapeutic intervention in order to improve the following deficits and impairments:   Body Structure / Function / Physical Skills: ADL, Coordination, Endurance, GMC, UE  functional use, Balance, Sensation, Body mechanics, Decreased knowledge of use of DME, IADL, Pain, Dexterity, FMC, Strength, Gait, Mobility       Visit Diagnosis: Muscle weakness (generalized)  Other lack of coordination  Unsteadiness on feet    Problem List Patient Active Problem List   Diagnosis Date Noted   Lumbar disc disorder with myelopathy 06/01/2021   Unsteady gait 05/27/2021   Fall at home, initial encounter 05/27/2021   Lower extremity weakness 05/26/2021   Obesity (BMI 35.0-39.9 without comorbidity) 05/26/2021   Anxiety 09/12/2017   Bipolar disorder ([place]) 09/12/2017   Insulin dependent diabetes mellitus 09/12/2017   Primary insomnia 09/12/2017   Pure hypercholesterolemia 09/12/2017   Recurrent major depressive disorder, in partial remission ([place]) 09/12/2017   Tobacco dependence 09/12/2017   Vitamin D deficiency 09/12/2017   DM type 2 with diabetic peripheral neuropathy ([place]) 04/06/2016   Hypercalcemia 09/13/2015   Hypogonadism in male 09/13/2015   Non morbid obesity due to excess calories 09/13/2015   Leta Speller, MS, OTR/L  Darleene Cleaver, OT 11/15/2021, 8:21 AM  [place], [place], [address] Phone: [phone]   Fax:  [phone]  Name: Ara Mano MRN: [id number] Date of Birth: [birth year]

## 2021-11-15 NOTE — Addendum Note (Signed)
Addended by: Otis Dials on: 11/15/2021 09:05 AM   Modules accepted: Orders

## 2021-11-16 ENCOUNTER — Ambulatory Visit: Payer: Medicare Other | Admitting: Physical Therapy

## 2021-11-20 ENCOUNTER — Ambulatory Visit: Payer: Medicare Other | Attending: Physical Medicine and Rehabilitation | Admitting: Physical Therapy

## 2021-11-20 VITALS — BP 138/98

## 2021-11-20 DIAGNOSIS — R262 Difficulty in walking, not elsewhere classified: Secondary | ICD-10-CM | POA: Diagnosis present

## 2021-11-20 DIAGNOSIS — R2681 Unsteadiness on feet: Secondary | ICD-10-CM

## 2021-11-20 DIAGNOSIS — R269 Unspecified abnormalities of gait and mobility: Secondary | ICD-10-CM

## 2021-11-20 DIAGNOSIS — M6281 Muscle weakness (generalized): Secondary | ICD-10-CM

## 2021-11-20 DIAGNOSIS — R2689 Other abnormalities of gait and mobility: Secondary | ICD-10-CM | POA: Insufficient documentation

## 2021-11-20 DIAGNOSIS — R278 Other lack of coordination: Secondary | ICD-10-CM | POA: Diagnosis present

## 2021-11-20 NOTE — Therapy (Signed)
[place] MAIN [facility] SERVICES [address] [place], [place], [address] Phone: [phone]   Fax:  [phone]  Physical Therapy Treatment  Patient Details  Name: Jerry Wheeler MRN: [id number] Date of Birth: 1976-03-27 Referring Provider (PT): Reesa Chew PA/ Netty Starring PCP   Encounter Date: 11/20/2021   PT End of Session - 11/20/21 1021     Visit Number 26    Number of Visits 47   corrected from previous recert   Date for PT Re-Evaluation 11/06/21    Authorization Type medicare    PT Start Time 1017    PT Stop Time 1056    PT Time Calculation (min) 39 min    Equipment Utilized During Treatment Gait belt    Activity Tolerance Patient tolerated treatment well    Behavior During Therapy WFL for tasks assessed/performed             Past Medical History:  Diagnosis Date   Anxiety disorder    Bipolar disorder ([place])    Diabetes mellitus ([place])    Diabetic peripheral neuropathy associated with type 2 diabetes mellitus ([place])    Emphysema lung ([place])    hospitalization 1999   High blood pressure    Major depressive disorder in partial remission ([place])    Morbid obesity ([place])    Osteomyelitis of great toe of left foot ([place])    Sleep apnea    Tobacco use disorder, continuous    Ulcer of right heel ([place])    followed by podiatry--fully healed 09/2020    Past Surgical History:  Procedure Laterality Date   DG GREAT TOE LEFT FOOT  2010   s/p I and D for osteo    Vitals:   11/20/21 1053  BP: (!) 138/98     Subjective Assessment - 11/20/21 1020     Subjective Pt reports he has recovered from his recent illness but still having some nausea in the AM and he is still feeling some but it is better at the moment.    Pertinent History 46 yo Male admitted to the hospital 05/26/2021 - 06/01/2021 at [facility] with lower extremity weakness secondary to peripheral neuropathy and severe spinal stenosis seen on MRI on 05/26/2021. He was discharged to  [facility] rehab 06/01/2021 - 06/09/2021. He was discharged to his brother's home. Prior to weakness he was in a 2 story apartment with stairs to enter. Therefore he is currently living at his brothers home which is 2 story but living is on the main floor; he does have 4 steps to enter home with B railings; He reports navigating those steps well. He is currenlty ambulating with RW, able to take a few steps without walker. Reports falling on 12/9 prior to admittance but otherwise no new falls. He denies any significant numbness/tingling; He does have neuropathy in the feet from diabetes but he doesn't feel that it has worsened. He does have an appointment with Dr. Lacinda Axon (neurosurgeon); He is not sure that they will recommend surgery due to uncontrolled diabetes and concern that surgery may not improve condition;    How long can you sit comfortably? several hours    How long can you stand comfortably? Unsure- able to stand at least 5 min but hasn't tested it.    How long can you walk comfortably? 5-7 minutes    Diagnostic tests MRI on 05/26/21 IMPRESSION:  1. Multilevel lumbar spondylosis, as detailed above.  2. Severe canal stenosis at L3-L4.  3. Moderate canal stenosis  at L2-L3.  4. Mild-to-moderate canal stenosis with moderate-severe left  foraminal stenosis at L4-L5.    Patient Stated Goals "get back into my apartment- negotiate steps, walk without walker"             Exercise   Time/reps/resistance Assistance  Charge type  Nustep  Level 2 x2 min  Level 4 x 2 min  Level 5 x 2 min  Setup, managing resistance change intervals  Therex   To improve LE endurance and strength   Step over forward and backward foam roller with quad cane  Prolonged stance on airex  X 10 L LE  2 X 1:15 minute  UE assist  No UE assistance therex To improve object navigation with using quad cane   To improve standing endurance and ankle muscle activation for balance. Trembling noted in Les bilaterally starting at 1:15  sec   Heel raises seated  X 15 ea with 9# weight  Therex    Rocker bard seated toe raises  X 15   therex Used rocker board to maximize available range of motion    Exercise/Activity Sets/Reps/Time/ Resistance Assistance Charge type Comments  Standing gastroc stretch  2 x 10   Therex  To improve ankle mobility            Rationale for Evaluation and Treatment Rehabilitation  Pt educated throughout session about proper posture and technique with exercises. Improved exercise technique, movement at target joints, use of target muscles after min to mod verbal, visual, tactile cues.  Note: Portions of this document were prepared using Dragon voice recognition software and although reviewed may contain unintentional dictation errors in syntax, grammar, or spelling.  Pt session was cut a little short today due to pt arriving a few minutes late to scheduled appointment time secondary to transportation issues.                             PT Education - 11/20/21 1021     Education Details Exercise technique    Person(s) Educated Patient    Methods Explanation;Demonstration    Comprehension Verbalized understanding;Returned demonstration              PT Short Term Goals - 11/14/21 1112       PT SHORT TERM GOAL #1   Title Patient will be adherent to HEP at least 3x a week to improve functional strength and balance for better safety at home.    Baseline 2/27: reports doing them 3-4x a week;    Time 4    Period Weeks    Status Achieved    Target Date 08/10/21      PT SHORT TERM GOAL #2   Title Patient will demonstrate improvement in foot/ankle strength by being able to complete BLE heel raise with minimal UE support for better gait safety and mobility;    Baseline 2/27: unable; 09/11/21: unable, good MMT strength, unable to lift off floor without UE support    Time 4    Period Weeks    Status Not Met    Target Date 12/12/21               PT Long Term  Goals - 11/14/21 1112       PT LONG TERM GOAL #1   Title Patient will increase BLE gross strength to 4+/5  especially in hip as to improve functional strength for independent gait, increased standing tolerance and increased  ADL ability.    Baseline 2/27: see flowsheet; 09/11/21: see flowsheet, remains limited significantly in ankle strength    Time 8    Period Weeks    Status Partially Met    Target Date 01/09/22      PT LONG TERM GOAL #2   Title Patient will increase six minute walk test distance to >800 for progression toward community ambulator and improve gait ability    Baseline 2/27: 850 feet with RW    Time 8    Period Weeks    Status Achieved    Target Date 09/07/21      PT LONG TERM GOAL #3   Title Patient will demonstrate an improved Berg Balance Score of > 42/56 as to demonstrate improved balance with ADLs such as sitting/standing and transfer balance and reduced fall risk.    Baseline 2/27: 25/56; 09/11/21: 37/56 4/24: 41/56    Time 8    Period Weeks    Status On-going    Target Date 01/09/22      PT LONG TERM GOAL #4   Title Patient will reduce timed up and go to <14 seconds to reduce fall risk and demonstrate improved transfer/gait ability.    Baseline 2/27: 9.48 sec with RW;    Time 8    Period Weeks    Status Achieved    Target Date 09/07/21      PT LONG TERM GOAL #5   Title Pt will be able to tolerate standing >/= 3 min without UE support and without LOB to demonstrate ability to complete grooming/bathing tasks and other standing ADL's without LOB.    Baseline 09/11/21: 1 min 45 sec 10/09/21: 3 min but 1 LOB with step correction and significant fatigue and LE trembling    Time 8    Period Weeks    Status Revised    Target Date 01/09/22                   Plan - 11/20/21 1022     Clinical Impression Statement Pt presents with good motivation fo rPT activities. Pt has elevated BP this session and some c/o lightheadedness so exercises session was  limited as a result. Pt continued with LE endurance and balance and stability exercises this session with good overall response to interventions. Pt instructed to conitnue to monotor BP for elevated numbers. Pt will coninue to benefit from skilled PT in order to imporve balance, endurance and QOL.    Personal Factors and Comorbidities Comorbidity 3+;Fitness;Time since onset of injury/illness/exacerbation    Comorbidities PMH Significant: Obesity, Anxiety/depression, bipolar disorder, Type 2 diabetes with neuropathy, insomnia, HTN, Lumbar stenosis    Examination-Activity Limitations Caring for Others;Carry;Locomotion Level;Squat;Stairs;Stand;Transfers    Examination-Participation Restrictions Cleaning;Community Activity;Driving;Occupation;Shop;Volunteer;Yard Work    Stability/Clinical Decision Making Stable/Uncomplicated    Rehab Potential Fair    PT Frequency 2x / week    PT Duration 8 weeks    PT Treatment/Interventions ADLs/Self Care Home Management;Cryotherapy;Moist Heat;Gait training;Stair training;Functional mobility training;Therapeutic activities;Therapeutic exercise;Balance training;Neuromuscular re-education;Patient/family education;Orthotic Fit/Training;Manual techniques    PT Next Visit Plan Continue with strength/balance    PT Home Exercise Plan 07/26/2021=Access Code: [email or web address]; no updates    Consulted and Agree with Plan of Care Patient             Patient will benefit from skilled therapeutic intervention in order to improve the following deficits and impairments:  Abnormal gait, Decreased balance, Decreased endurance, Decreased mobility, Difficulty walking, Impaired sensation, Obesity, Increased edema, Decreased activity  tolerance, Decreased safety awareness, Decreased strength  Visit Diagnosis: Muscle weakness (generalized)  Other lack of coordination  Unsteadiness on feet  Abnormality of gait and mobility     Problem List Patient Active Problem List   Diagnosis Date  Noted   Lumbar disc disorder with myelopathy 06/01/2021   Unsteady gait 05/27/2021   Fall at home, initial encounter 05/27/2021   Lower extremity weakness 05/26/2021   Obesity (BMI 35.0-39.9 without comorbidity) 05/26/2021   Anxiety 09/12/2017   Bipolar disorder ([place]) 09/12/2017   Insulin dependent diabetes mellitus 09/12/2017   Primary insomnia 09/12/2017   Pure hypercholesterolemia 09/12/2017   Recurrent major depressive disorder, in partial remission ([place]) 09/12/2017   Tobacco dependence 09/12/2017   Vitamin D deficiency 09/12/2017   DM type 2 with diabetic peripheral neuropathy ([place]) 04/06/2016   Hypercalcemia 09/13/2015   Hypogonadism in male 09/13/2015   Non morbid obesity due to excess calories 09/13/2015    Particia Lather, PT 11/20/2021, 11:02 AM  [place] [address] [place], [place], [address] Phone: [phone]   Fax:  [phone]  Name: Irma Delancey MRN: [id number] Date of Birth: 07-Aug-1975

## 2021-11-22 ENCOUNTER — Encounter: Payer: Self-pay | Admitting: Physical Therapy

## 2021-11-22 ENCOUNTER — Ambulatory Visit: Payer: Medicare Other | Admitting: Physical Therapy

## 2021-11-22 DIAGNOSIS — R269 Unspecified abnormalities of gait and mobility: Secondary | ICD-10-CM

## 2021-11-22 DIAGNOSIS — R2681 Unsteadiness on feet: Secondary | ICD-10-CM

## 2021-11-22 DIAGNOSIS — M6281 Muscle weakness (generalized): Secondary | ICD-10-CM | POA: Diagnosis not present

## 2021-11-22 DIAGNOSIS — R2689 Other abnormalities of gait and mobility: Secondary | ICD-10-CM

## 2021-11-22 DIAGNOSIS — R262 Difficulty in walking, not elsewhere classified: Secondary | ICD-10-CM

## 2021-11-22 NOTE — Therapy (Signed)
[place] MAIN [facility] SERVICES [address] [place], [place], [address] Phone: [phone]   Fax:  [phone]  Physical Therapy Treatment  Patient Details  Name: Jerry Wheeler MRN: [id number] Date of Birth: 06-11-1976 Referring Provider (PT): Reesa Chew PA/ Netty Starring PCP   Encounter Date: 11/22/2021   PT End of Session - 11/22/21 1026     Visit Number 27    Number of Visits 14   corrected from previous recert   Authorization Type medicare    PT Start Time 1021    PT Stop Time 1101    PT Time Calculation (min) 40 min    Equipment Utilized During Treatment Gait belt    Activity Tolerance Patient tolerated treatment well    Behavior During Therapy WFL for tasks assessed/performed             Past Medical History:  Diagnosis Date   Anxiety disorder    Bipolar disorder ([place])    Diabetes mellitus ([place])    Diabetic peripheral neuropathy associated with type 2 diabetes mellitus ([place])    Emphysema lung ([place])    hospitalization 1999   High blood pressure    Major depressive disorder in partial remission ([place])    Morbid obesity ([place])    Osteomyelitis of great toe of left foot ([place])    Sleep apnea    Tobacco use disorder, continuous    Ulcer of right heel ([place])    followed by podiatry--fully healed 09/2020    Past Surgical History:  Procedure Laterality Date   DG GREAT TOE LEFT FOOT  2010   s/p I and D for osteo    There were no vitals filed for this visit.   Subjective Assessment - 11/22/21 1025     Subjective Pt reports feeling better since last session with less c/o lightheadedness.    Pertinent History 46 yo Male admitted to the hospital 05/26/2021 - 06/01/2021 at [facility] with lower extremity weakness secondary to peripheral neuropathy and severe spinal stenosis seen on MRI on 05/26/2021. He was discharged to [facility] rehab 06/01/2021 - 06/09/2021. He was discharged to his brother's home. Prior to weakness he was in a 2  story apartment with stairs to enter. Therefore he is currently living at his brothers home which is 2 story but living is on the main floor; he does have 4 steps to enter home with B railings; He reports navigating those steps well. He is currenlty ambulating with RW, able to take a few steps without walker. Reports falling on 12/9 prior to admittance but otherwise no new falls. He denies any significant numbness/tingling; He does have neuropathy in the feet from diabetes but he doesn't feel that it has worsened. He does have an appointment with Dr. Lacinda Axon (neurosurgeon); He is not sure that they will recommend surgery due to uncontrolled diabetes and concern that surgery may not improve condition;    How long can you sit comfortably? several hours    How long can you stand comfortably? Unsure- able to stand at least 5 min but hasn't tested it.    How long can you walk comfortably? 5-7 minutes    Diagnostic tests MRI on 05/26/21 IMPRESSION:  1. Multilevel lumbar spondylosis, as detailed above.  2. Severe canal stenosis at L3-L4.  3. Moderate canal stenosis at L2-L3.  4. Mild-to-moderate canal stenosis with moderate-severe left  foraminal stenosis at L4-L5.    Patient Stated Goals "get back into my apartment- negotiate steps, walk without  walker"              Exercise   Time/reps/resistance Assistance  Charge type  Ambulation interval with STS  5 x STS with 150 feet amb wuth QC  5 X STS with 300 feet ambulation  CGA  Therex  Pt reports he felt like he could complete further distance, attempted 300 feet of ambulation on second set.    Prolonged stance on airex    2 X 1:20 minute    No UE assistance therex  To improve standing endurance and ankle muscle activation for balance. Trembling noted in Les bilaterally starting at 1 min, able to complete full time as listed but requires rest break following.  - 1 LOB on second set when pt looked at watch to check text   Heel raises seated  2 X 15 ea with  9# weight  Therex    Rocker board seated toe raises  X 20  x 3 sec hold   therex Used rocker board to maximize available range of motion    Exercise/Activity Sets/Reps/Time/ Resistance Assistance Charge type Comments  Step onto and off airex with QC fotr support  X 5 ea direction  QC, SBA  Therex  No LOB noted, to improve balance with QC navigation on unstable surfaces.            Rationale for Evaluation and Treatment Rehabilitation  Pt educated throughout session about proper posture and technique with exercises. Improved exercise technique, movement at target joints, use of target muscles after min to mod verbal, visual, tactile cues.  Note: Portions of this document were prepared using Dragon voice recognition software and although reviewed may contain unintentional dictation errors in syntax, grammar, or spelling.  Pt session was cut a little short today due to pt arriving a few minutes late to scheduled appointment time secondary to transportation issues.                              PT Education - 11/22/21 1026     Education Details Exercise technique    Person(s) Educated Patient    Methods Explanation;Demonstration    Comprehension Verbalized understanding;Returned demonstration              PT Short Term Goals - 11/14/21 1112       PT SHORT TERM GOAL #1   Title Patient will be adherent to HEP at least 3x a week to improve functional strength and balance for better safety at home.    Baseline 2/27: reports doing them 3-4x a week;    Time 4    Period Weeks    Status Achieved    Target Date 08/10/21      PT SHORT TERM GOAL #2   Title Patient will demonstrate improvement in foot/ankle strength by being able to complete BLE heel raise with minimal UE support for better gait safety and mobility;    Baseline 2/27: unable; 09/11/21: unable, good MMT strength, unable to lift off floor without UE support    Time 4    Period Weeks    Status Not  Met    Target Date 12/12/21               PT Long Term Goals - 11/14/21 1112       PT LONG TERM GOAL #1   Title Patient will increase BLE gross strength to 4+/5  especially in hip as to improve  functional strength for independent gait, increased standing tolerance and increased ADL ability.    Baseline 2/27: see flowsheet; 09/11/21: see flowsheet, remains limited significantly in ankle strength    Time 8    Period Weeks    Status Partially Met    Target Date 01/09/22      PT LONG TERM GOAL #2   Title Patient will increase six minute walk test distance to >800 for progression toward community ambulator and improve gait ability    Baseline 2/27: 850 feet with RW    Time 8    Period Weeks    Status Achieved    Target Date 09/07/21      PT LONG TERM GOAL #3   Title Patient will demonstrate an improved Berg Balance Score of > 42/56 as to demonstrate improved balance with ADLs such as sitting/standing and transfer balance and reduced fall risk.    Baseline 2/27: 25/56; 09/11/21: 37/56 4/24: 41/56    Time 8    Period Weeks    Status On-going    Target Date 01/09/22      PT LONG TERM GOAL #4   Title Patient will reduce timed up and go to <14 seconds to reduce fall risk and demonstrate improved transfer/gait ability.    Baseline 2/27: 9.48 sec with RW;    Time 8    Period Weeks    Status Achieved    Target Date 09/07/21      PT LONG TERM GOAL #5   Title Pt will be able to tolerate standing >/= 3 min without UE support and without LOB to demonstrate ability to complete grooming/bathing tasks and other standing ADL's without LOB.    Baseline 09/11/21: 1 min 45 sec 10/09/21: 3 min but 1 LOB with step correction and significant fatigue and LE trembling    Time 8    Period Weeks    Status Revised    Target Date 01/09/22                   Plan - 11/22/21 1028     Clinical Impression Statement Pt presents with good motivation for copmletion of PT activities. Pt  progresses with endurance training for functional ambulaiton with less c/o fatigue. Pt also coninues with standing endurance and balance training with good overall response.Pt still not cinfident with QC navigation on various surfaces and will contnue to incoroprate this in furure sessions to improve his efficacy.  Pt will continue to benefit from skilled PT intervention to ipmrove his balance, mobility and LE strength.    Personal Factors and Comorbidities Comorbidity 3+;Fitness;Time since onset of injury/illness/exacerbation    Comorbidities PMH Significant: Obesity, Anxiety/depression, bipolar disorder, Type 2 diabetes with neuropathy, insomnia, HTN, Lumbar stenosis    Examination-Activity Limitations Caring for Others;Carry;Locomotion Level;Squat;Stairs;Stand;Transfers    Examination-Participation Restrictions Cleaning;Community Activity;Driving;Occupation;Shop;Volunteer;Yard Work    Stability/Clinical Decision Making Stable/Uncomplicated    Rehab Potential Fair    PT Frequency 2x / week    PT Duration 8 weeks    PT Treatment/Interventions ADLs/Self Care Home Management;Cryotherapy;Moist Heat;Gait training;Stair training;Functional mobility training;Therapeutic activities;Therapeutic exercise;Balance training;Neuromuscular re-education;Patient/family education;Orthotic Fit/Training;Manual techniques    PT Next Visit Plan Continue with strength/balance    PT Home Exercise Plan 07/26/2021=Access Code: [email or web address]; no updates    Consulted and Agree with Plan of Care Patient             Patient will benefit from skilled therapeutic intervention in order to improve the following deficits and impairments:  Abnormal gait,  Decreased balance, Decreased endurance, Decreased mobility, Difficulty walking, Impaired sensation, Obesity, Increased edema, Decreased activity tolerance, Decreased safety awareness, Decreased strength  Visit Diagnosis: Unsteadiness on feet  Abnormality of gait and  mobility  Difficulty in walking, not elsewhere classified  Other abnormalities of gait and mobility     Problem List Patient Active Problem List   Diagnosis Date Noted   Lumbar disc disorder with myelopathy 06/01/2021   Unsteady gait 05/27/2021   Fall at home, initial encounter 05/27/2021   Lower extremity weakness 05/26/2021   Obesity (BMI 35.0-39.9 without comorbidity) 05/26/2021   Anxiety 09/12/2017   Bipolar disorder ([place]) 09/12/2017   Insulin dependent diabetes mellitus 09/12/2017   Primary insomnia 09/12/2017   Pure hypercholesterolemia 09/12/2017   Recurrent major depressive disorder, in partial remission ([place]) 09/12/2017   Tobacco dependence 09/12/2017   Vitamin D deficiency 09/12/2017   DM type 2 with diabetic peripheral neuropathy ([place]) 04/06/2016   Hypercalcemia 09/13/2015   Hypogonadism in male 09/13/2015   Non morbid obesity due to excess calories 09/13/2015    Particia Lather, PT 11/22/2021, 11:07 AM  [place] [address] [place], [place], [address] Phone: [phone]   Fax:  [phone]  Name: Jerry Wheeler MRN: [id number] Date of Birth: [birth year]

## 2021-11-27 ENCOUNTER — Ambulatory Visit: Payer: Medicare Other | Admitting: Physical Therapy

## 2021-11-29 ENCOUNTER — Ambulatory Visit: Payer: Medicare Other | Admitting: Physical Therapy

## 2021-12-04 ENCOUNTER — Encounter: Payer: Self-pay | Admitting: Physical Therapy

## 2021-12-04 ENCOUNTER — Ambulatory Visit: Payer: Medicare Other | Admitting: Physical Therapy

## 2021-12-04 DIAGNOSIS — R2681 Unsteadiness on feet: Secondary | ICD-10-CM

## 2021-12-04 DIAGNOSIS — R262 Difficulty in walking, not elsewhere classified: Secondary | ICD-10-CM

## 2021-12-04 DIAGNOSIS — M6281 Muscle weakness (generalized): Secondary | ICD-10-CM | POA: Diagnosis not present

## 2021-12-04 DIAGNOSIS — R269 Unspecified abnormalities of gait and mobility: Secondary | ICD-10-CM

## 2021-12-04 NOTE — Therapy (Signed)
[place] MAIN [facility] SERVICES [address] [place], [place], [address] Phone: [phone]   Fax:  [phone]  Physical Therapy Treatment  Patient Details  Name: Jerry Wheeler MRN: [id number] Date of Birth: 1975/09/11 Referring Provider (PT): Reesa Chew PA/ Netty Starring PCP   Encounter Date: 12/04/2021   PT End of Session - 12/04/21 1107     Visit Number 28    Number of Visits 47   corrected from previous recert   Date for PT Re-Evaluation 01/09/22    Authorization Type medicare    PT Start Time 1104    PT Stop Time 1144    PT Time Calculation (min) 40 min    Equipment Utilized During Treatment Gait belt    Activity Tolerance Patient tolerated treatment well    Behavior During Therapy WFL for tasks assessed/performed             Past Medical History:  Diagnosis Date   Anxiety disorder    Bipolar disorder ([place])    Diabetes mellitus ([place])    Diabetic peripheral neuropathy associated with type 2 diabetes mellitus ([place])    Emphysema lung ([place])    hospitalization 1999   High blood pressure    Major depressive disorder in partial remission ([place])    Morbid obesity ([place])    Osteomyelitis of great toe of left foot ([place])    Sleep apnea    Tobacco use disorder, continuous    Ulcer of right heel ([place])    followed by podiatry--fully healed 09/2020    Past Surgical History:  Procedure Laterality Date   DG GREAT TOE LEFT FOOT  2010   s/p I and D for osteo    There were no vitals filed for this visit.   Subjective Assessment - 12/04/21 1026     Subjective Pt reports he had a rough week last week as his father was in the [place]. he reports his father is better and the MD are on top of it.    Pertinent History 46 yo Male admitted to the hospital 05/26/2021 - 06/01/2021 at [facility] with lower extremity weakness secondary to peripheral neuropathy and severe spinal stenosis seen on MRI on 05/26/2021. He was discharged to [facility] rehab  06/01/2021 - 06/09/2021. He was discharged to his brother's home. Prior to weakness he was in a 2 story apartment with stairs to enter. Therefore he is currently living at his brothers home which is 2 story but living is on the main floor; he does have 4 steps to enter home with B railings; He reports navigating those steps well. He is currenlty ambulating with RW, able to take a few steps without walker. Reports falling on 12/9 prior to admittance but otherwise no new falls. He denies any significant numbness/tingling; He does have neuropathy in the feet from diabetes but he doesn't feel that it has worsened. He does have an appointment with Dr. Lacinda Axon (neurosurgeon); He is not sure that they will recommend surgery due to uncontrolled diabetes and concern that surgery may not improve condition;    How long can you sit comfortably? several hours    How long can you stand comfortably? Unsure- able to stand at least 5 min but hasn't tested it.    How long can you walk comfortably? 5-7 minutes    Diagnostic tests MRI on 05/26/21 IMPRESSION:  1. Multilevel lumbar spondylosis, as detailed above.  2. Severe canal stenosis at L3-L4.  3. Moderate canal stenosis at L2-L3.  4. Mild-to-moderate canal stenosis with moderate-severe left  foraminal stenosis at L4-L5.    Patient Stated Goals "get back into my apartment- negotiate steps, walk without walker"    Currently in Pain? Yes    Pain Score 4     Pain Location Leg    Pain Orientation Right;Left    Pain Descriptors / Indicators Sore    Pain Type Acute pain    Pain Onset In the past 7 days    Pain Frequency Intermittent              Exercise   Time/reps/resistance Assistance  Charge type  Ambulation interval with STS  5 X STS with 300 feet ambulation  CGA  Therex  Pt reports he felt like he could complete further distance, attempted 300 feet of ambulation on second set.    Prolonged stance on airex  2 X 1:30 minute    No UE assistance therex To improve  standing balance and endurance.   Heel raises seated  2 X 20 ea with 9# weight x 3 sec holds   Therex    Rocker board seated toe raises  X 20  x 3 sec hold   therex Used rocker board to maximize available range of motion    Exercise/Activity Sets/Reps/Time/ Resistance Assistance Charge type Comments  Ambulation figure 8  X 4 rounds  SBQC therex To practice safety with cane ambulation with turning.            Rationale for Evaluation and Treatment Rehabilitation  Pt educated throughout session about proper posture and technique with exercises. Improved exercise technique, movement at target joints, use of target muscles after min to mod verbal, visual, tactile cues.  Note: Portions of this document were prepared using Dragon voice recognition software and although reviewed may contain unintentional dictation errors in syntax, grammar, or spelling.  Pt session was cut a little short today due to pt arriving a few minutes late to scheduled appointment time secondary to transportation issues.                                PT Short Term Goals - 11/14/21 1112       PT SHORT TERM GOAL #1   Title Patient will be adherent to HEP at least 3x a week to improve functional strength and balance for better safety at home.    Baseline 2/27: reports doing them 3-4x a week;    Time 4    Period Weeks    Status Achieved    Target Date 08/10/21      PT SHORT TERM GOAL #2   Title Patient will demonstrate improvement in foot/ankle strength by being able to complete BLE heel raise with minimal UE support for better gait safety and mobility;    Baseline 2/27: unable; 09/11/21: unable, good MMT strength, unable to lift off floor without UE support    Time 4    Period Weeks    Status Not Met    Target Date 12/12/21               PT Long Term Goals - 11/14/21 1112       PT LONG TERM GOAL #1   Title Patient will increase BLE gross strength to 4+/5  especially in hip as  to improve functional strength for independent gait, increased standing tolerance and increased ADL ability.    Baseline 2/27: see flowsheet; 09/11/21:  see flowsheet, remains limited significantly in ankle strength    Time 8    Period Weeks    Status Partially Met    Target Date 01/09/22      PT LONG TERM GOAL #2   Title Patient will increase six minute walk test distance to >800 for progression toward community ambulator and improve gait ability    Baseline 2/27: 850 feet with RW    Time 8    Period Weeks    Status Achieved    Target Date 09/07/21      PT LONG TERM GOAL #3   Title Patient will demonstrate an improved Berg Balance Score of > 42/56 as to demonstrate improved balance with ADLs such as sitting/standing and transfer balance and reduced fall risk.    Baseline 2/27: 25/56; 09/11/21: 37/56 4/24: 41/56    Time 8    Period Weeks    Status On-going    Target Date 01/09/22      PT LONG TERM GOAL #4   Title Patient will reduce timed up and go to <14 seconds to reduce fall risk and demonstrate improved transfer/gait ability.    Baseline 2/27: 9.48 sec with RW;    Time 8    Period Weeks    Status Achieved    Target Date 09/07/21      PT LONG TERM GOAL #5   Title Pt will be able to tolerate standing >/= 3 min without UE support and without LOB to demonstrate ability to complete grooming/bathing tasks and other standing ADL's without LOB.    Baseline 09/11/21: 1 min 45 sec 10/09/21: 3 min but 1 LOB with step correction and significant fatigue and LE trembling    Time 8    Period Weeks    Status Revised    Target Date 01/09/22                   Plan - 12/04/21 1245     Personal Factors and Comorbidities Comorbidity 3+;Fitness;Time since onset of injury/illness/exacerbation    Comorbidities PMH Significant: Obesity, Anxiety/depression, bipolar disorder, Type 2 diabetes with neuropathy, insomnia, HTN, Lumbar stenosis    Examination-Activity Limitations Caring for  Others;Carry;Locomotion Level;Squat;Stairs;Stand;Transfers    Examination-Participation Restrictions Cleaning;Community Activity;Driving;Occupation;Shop;Volunteer;Yard Work    Stability/Clinical Decision Making Stable/Uncomplicated    Rehab Potential Fair    PT Frequency 2x / week    PT Duration 8 weeks    PT Treatment/Interventions ADLs/Self Care Home Management;Cryotherapy;Moist Heat;Gait training;Stair training;Functional mobility training;Therapeutic activities;Therapeutic exercise;Balance training;Neuromuscular re-education;Patient/family education;Orthotic Fit/Training;Manual techniques    PT Next Visit Plan Continue with strength/balance    PT Home Exercise Plan 07/26/2021=Access Code: [email or web address]; no updates    Consulted and Agree with Plan of Care Patient             Patient will benefit from skilled therapeutic intervention in order to improve the following deficits and impairments:  Abnormal gait, Decreased balance, Decreased endurance, Decreased mobility, Difficulty walking, Impaired sensation, Obesity, Increased edema, Decreased activity tolerance, Decreased safety awareness, Decreased strength  Visit Diagnosis: Unsteadiness on feet  Abnormality of gait and mobility  Difficulty in walking, not elsewhere classified  Muscle weakness (generalized)     Problem List Patient Active Problem List   Diagnosis Date Noted   Lumbar disc disorder with myelopathy 06/01/2021   Unsteady gait 05/27/2021   Fall at home, initial encounter 05/27/2021   Lower extremity weakness 05/26/2021   Obesity (BMI 35.0-39.9 without comorbidity) 05/26/2021   Anxiety 09/12/2017   Bipolar  disorder ([place]) 09/12/2017   Insulin dependent diabetes mellitus 09/12/2017   Primary insomnia 09/12/2017   Pure hypercholesterolemia 09/12/2017   Recurrent major depressive disorder, in partial remission ([place]) 09/12/2017   Tobacco dependence 09/12/2017   Vitamin D deficiency 09/12/2017   DM type 2 with diabetic  peripheral neuropathy ([place]) 04/06/2016   Hypercalcemia 09/13/2015   Hypogonadism in male 09/13/2015   Non morbid obesity due to excess calories 09/13/2015    Particia Lather, PT 12/04/2021, 12:54 PM  [place] MAIN [facility] SERVICES [address] [place], [place], [address] Phone: [phone]   Fax:  [phone]  Name: Jerry Wheeler MRN: [id number] Date of Birth: [birth year]

## 2021-12-06 ENCOUNTER — Ambulatory Visit: Payer: Medicare Other | Admitting: Physical Therapy

## 2021-12-11 ENCOUNTER — Ambulatory Visit: Payer: Medicare Other | Admitting: Physical Therapy

## 2021-12-13 ENCOUNTER — Encounter: Payer: Self-pay | Admitting: Physical Therapy

## 2021-12-13 ENCOUNTER — Ambulatory Visit: Payer: Medicare Other | Admitting: Physical Therapy

## 2021-12-13 DIAGNOSIS — R2689 Other abnormalities of gait and mobility: Secondary | ICD-10-CM

## 2021-12-13 DIAGNOSIS — M6281 Muscle weakness (generalized): Secondary | ICD-10-CM | POA: Diagnosis not present

## 2021-12-13 DIAGNOSIS — R262 Difficulty in walking, not elsewhere classified: Secondary | ICD-10-CM

## 2021-12-13 DIAGNOSIS — R269 Unspecified abnormalities of gait and mobility: Secondary | ICD-10-CM

## 2021-12-13 DIAGNOSIS — R2681 Unsteadiness on feet: Secondary | ICD-10-CM

## 2021-12-13 NOTE — Therapy (Signed)
OUTPATIENT PHYSICAL THERAPY TREATMENT NOTE   Patient Name: Jerry Wheeler MRN: [id number] DOB:[birth year], 46 y.o., male Today's Date: 12/13/2021  PCP:   Dion Body, MD    REFERRING PROVIDER: Bary Leriche, PA-C   PT End of Session - 12/13/21 0851     Visit Number 29    Number of Visits 26   corrected from previous recert   Date for PT Re-Evaluation 01/09/22    Authorization Type medicare    PT Start Time 0849    PT Stop Time 0930    PT Time Calculation (min) 41 min    Equipment Utilized During Treatment Gait belt    Activity Tolerance Patient tolerated treatment well    Behavior During Therapy WFL for tasks assessed/performed             Past Medical History:  Diagnosis Date   Anxiety disorder    Bipolar disorder ([place])    Diabetes mellitus ([place])    Diabetic peripheral neuropathy associated with type 2 diabetes mellitus ([place])    Emphysema lung ([place])    hospitalization 1999   High blood pressure    Major depressive disorder in partial remission ([place])    Morbid obesity ([place])    Osteomyelitis of great toe of left foot ([place])    Sleep apnea    Tobacco use disorder, continuous    Ulcer of right heel ([place])    followed by podiatry--fully healed 09/2020   Past Surgical History:  Procedure Laterality Date   DG GREAT TOE LEFT FOOT  2010   s/p I and D for osteo   Patient Active Problem List   Diagnosis Date Noted   Lumbar disc disorder with myelopathy 06/01/2021   Unsteady gait 05/27/2021   Fall at home, initial encounter 05/27/2021   Lower extremity weakness 05/26/2021   Obesity (BMI 35.0-39.9 without comorbidity) 05/26/2021   Anxiety 09/12/2017   Bipolar disorder ([place]) 09/12/2017   Insulin dependent diabetes mellitus 09/12/2017   Primary insomnia 09/12/2017   Pure hypercholesterolemia 09/12/2017   Recurrent major depressive disorder, in partial remission ([place]) 09/12/2017   Tobacco dependence 09/12/2017   Vitamin D deficiency 09/12/2017   DM type 2  with diabetic peripheral neuropathy ([place]) 04/06/2016   Hypercalcemia 09/13/2015   Hypogonadism in male 09/13/2015   Non morbid obesity due to excess calories 09/13/2015    REFERRING DIAG: M51.06 (ICD-10-CM) - Lumbar disc disorder with myelopathy  THERAPY DIAG:  Unsteadiness on feet  Abnormality of gait and mobility  Difficulty in walking, not elsewhere classified  Other abnormalities of gait and mobility  Rationale for Evaluation and Treatment Rehabilitation  PERTINENT HISTORY: 46 yo Male admitted to the hospital 05/26/2021 - 06/01/2021 at [facility] with lower extremity weakness secondary to peripheral neuropathy and severe spinal stenosis seen on MRI on 05/26/2021. He was discharged to [facility] rehab 06/01/2021 - 06/09/2021. He was discharged to his brother's home. Prior to weakness he was in a 2 story apartment with stairs to enter. Therefore he is currently living at his brothers home which is 2 story but living is on the main floor; he does have 4 steps to enter home with B railings; He reports navigating those steps well. He is currenlty ambulating with RW, able to take a few steps without walker. Reports falling on 12/9 prior to admittance but otherwise no new falls. He denies any significant numbness/tingling; He does have neuropathy in the feet from diabetes but he doesn't feel that it has worsened. He does have an appointment  with Dr. Lacinda Axon (neurosurgeon); He is not sure that they will recommend surgery due to uncontrolled diabetes and concern that surgery may not improve condition;   PRECAUTIONS: Fall   SUBJECTIVE: Pt reports no significant changes since last session. Pt reports he has been able to start driving again as he has better access to his car.   PAIN:  Are you having pain? No     TODAY'S TREATMENT:  Exercise       Time/reps/resistance     Assistance  Charge type  Ambulation interval with STS  2* 5 X STS with 325 feet ambulation  CGA  Therex  Pt complete tight turns  while ambulating around objects and without QC this session, pt reports as difficult and fatiguing   Prolonged stance on airex  2 X 1:30 minute    No UE assistance therex To improve standing balance and endurance.   Heel raises seated  2 X 20 ea with 9# weight x 3 sec holds   Therex  Cues for eccentric control   Rocker board seated toe raises  X 15  x 3 sec hold   therex Used rocker board to maximize available range of motion   SLS progression - 1 LE on airex, 1 LE on 6 inch step  2 x 30 sec ea LE  CGA NMR First round on R LE multiple LOB, second round fewer LOB noted. Pt has fatigue in [place] shown with LE trembling with increased time.   Sidesteps on airex balance beam  2 x 1 min ea  UE NMR To work on LE foot placement. Good performance without LOB      Rationale for Evaluation and Treatment Rehabilitation  Pt educated throughout session about proper posture and technique with exercises. Improved exercise technique, movement at target joints, use of target muscles after min to mod verbal, visual, tactile cues.  Note: Portions of this document were prepared using Dragon voice recognition software and although reviewed may contain unintentional dictation errors in syntax, grammar, or spelling.  Pt session was cut a little short today due to pt arriving a few minutes late to scheduled appointment time secondary to transportation issues.      PATIENT EDUCATION: Education details: Pt educated throughout session about proper posture and technique with exercises. Improved exercise technique, movement at target joints, use of target muscles after min to mod verbal, visual, tactile cues.  Person educated: Patient Education method: Explanation Education comprehension: verbalized understanding   HOME EXERCISE PROGRAM: No changes at this time    PT Short Term Goals -       PT SHORT TERM GOAL #1   Title Patient will be adherent to HEP at least 3x a week to improve functional strength and  balance for better safety at home.    Baseline 2/27: reports doing them 3-4x a week;    Time 4    Period Weeks    Status Achieved    Target Date 08/10/21      PT SHORT TERM GOAL #2   Title Patient will demonstrate improvement in foot/ankle strength by being able to complete BLE heel raise with minimal UE support for better gait safety and mobility;    Baseline 2/27: unable; 09/11/21: unable, good MMT strength, unable to lift off floor without UE support    Time 4    Period Weeks    Status Not Met    Target Date 12/12/21  PT Long Term Goals -       PT LONG TERM GOAL #1   Title Patient will increase BLE gross strength to 4+/5  especially in hip as to improve functional strength for independent gait, increased standing tolerance and increased ADL ability.    Baseline 2/27: see flowsheet; 09/11/21: see flowsheet, remains limited significantly in ankle strength    Time 8    Period Weeks    Status Partially Met    Target Date 01/09/22      PT LONG TERM GOAL #2   Title Patient will increase six minute walk test distance to >800 for progression toward community ambulator and improve gait ability    Baseline 2/27: 850 feet with RW    Time 8    Period Weeks    Status Achieved    Target Date 09/07/21      PT LONG TERM GOAL #3   Title Patient will demonstrate an improved Berg Balance Score of > 42/56 as to demonstrate improved balance with ADLs such as sitting/standing and transfer balance and reduced fall risk.    Baseline 2/27: 25/56; 09/11/21: 37/56 4/24: 41/56    Time 8    Period Weeks    Status On-going    Target Date 01/09/22      PT LONG TERM GOAL #4   Title Patient will reduce timed up and go to <14 seconds to reduce fall risk and demonstrate improved transfer/gait ability.    Baseline 2/27: 9.48 sec with RW;    Time 8    Period Weeks    Status Achieved    Target Date 09/07/21      PT LONG TERM GOAL #5   Title Pt will be able to tolerate standing >/= 3  min without UE support and without LOB to demonstrate ability to complete grooming/bathing tasks and other standing ADL's without LOB.    Baseline 09/11/21: 1 min 45 sec 10/09/21: 3 min but 1 LOB with step correction and significant fatigue and LE trembling    Time 8    Period Weeks    Status Revised    Target Date 01/09/22              Plan - 12/13/21 9030     Clinical Impression Statement Continued with current plan of care as laid out in evaluation and recent prior sessions. Pt remains motivated to advance progress toward goals in order to maximize independence and safety at home. Pt requires high level assistance and cuing for completion of exercises in order to provide adequate level of stimulation and perturbation. Pt continues to progress with ambulation endurance and difficulty this session as he performs activities without AD while ambulating without LOB.  Pt continues to demonstrate progress toward goals AEB progression of some interventions this date either in volume or intensity.    Personal Factors and Comorbidities Comorbidity 3+;Fitness;Time since onset of injury/illness/exacerbation    Comorbidities PMH Significant: Obesity, Anxiety/depression, bipolar disorder, Type 2 diabetes with neuropathy, insomnia, HTN, Lumbar stenosis    Examination-Activity Limitations Caring for Others;Carry;Locomotion Level;Squat;Stairs;Stand;Transfers    Examination-Participation Restrictions Cleaning;Community Activity;Driving;Occupation;Shop;Volunteer;Yard Work    Stability/Clinical Decision Making Stable/Uncomplicated    Rehab Potential Fair    PT Frequency 2x / week    PT Duration 8 weeks    PT Treatment/Interventions ADLs/Self Care Home Management;Cryotherapy;Moist Heat;Gait training;Stair training;Functional mobility training;Therapeutic activities;Therapeutic exercise;Balance training;Neuromuscular re-education;Patient/family education;Orthotic Fit/Training;Manual techniques    PT Next Visit  Plan Continue with strength/balance    PT Home  Exercise Plan 07/26/2021=Access Code: [email or web address]; no updates    Consulted and Agree with Plan of Care Patient               Particia Lather, PT 12/13/2021, 8:53 AM

## 2021-12-20 ENCOUNTER — Ambulatory Visit: Payer: Medicare Other | Admitting: Physical Therapy

## 2021-12-22 ENCOUNTER — Ambulatory Visit: Payer: Medicare Other | Attending: Physical Medicine and Rehabilitation

## 2021-12-22 DIAGNOSIS — R2689 Other abnormalities of gait and mobility: Secondary | ICD-10-CM | POA: Diagnosis present

## 2021-12-22 DIAGNOSIS — M5106 Intervertebral disc disorders with myelopathy, lumbar region: Secondary | ICD-10-CM | POA: Insufficient documentation

## 2021-12-22 DIAGNOSIS — R262 Difficulty in walking, not elsewhere classified: Secondary | ICD-10-CM | POA: Insufficient documentation

## 2021-12-22 DIAGNOSIS — M6281 Muscle weakness (generalized): Secondary | ICD-10-CM | POA: Diagnosis present

## 2021-12-22 DIAGNOSIS — R269 Unspecified abnormalities of gait and mobility: Secondary | ICD-10-CM | POA: Diagnosis present

## 2021-12-22 DIAGNOSIS — R278 Other lack of coordination: Secondary | ICD-10-CM | POA: Insufficient documentation

## 2021-12-22 DIAGNOSIS — R2681 Unsteadiness on feet: Secondary | ICD-10-CM | POA: Insufficient documentation

## 2021-12-22 NOTE — Therapy (Signed)
OUTPATIENT PHYSICAL THERAPY TREATMENT NOTE/Physical Therapy Progress Note   Dates of reporting period  10/09/2021   to   12/22/2021    Patient Name: Jerry Wheeler MRN: [id number] DOB:05-24-1976, 46 y.o., male Today's Date: 12/22/2021  PCP:   Dion Body, MD    REFERRING PROVIDER: Bary Leriche, PA-C   PT End of Session - 12/22/21 1022     Visit Number 30    Number of Visits 58   corrected from previous recert   Date for PT Re-Evaluation 01/09/22    Authorization Type medicare    PT Start Time 0933    PT Stop Time 1015    PT Time Calculation (min) 42 min    Equipment Utilized During Treatment Gait belt    Activity Tolerance Patient tolerated treatment well    Behavior During Therapy WFL for tasks assessed/performed              Past Medical History:  Diagnosis Date   Anxiety disorder    Bipolar disorder ([place])    Diabetes mellitus ([place])    Diabetic peripheral neuropathy associated with type 2 diabetes mellitus ([place])    Emphysema lung ([place])    hospitalization 1999   High blood pressure    Major depressive disorder in partial remission ([place])    Morbid obesity ([place])    Osteomyelitis of great toe of left foot ([place])    Sleep apnea    Tobacco use disorder, continuous    Ulcer of right heel ([place])    followed by podiatry--fully healed 09/2020   Past Surgical History:  Procedure Laterality Date   DG GREAT TOE LEFT FOOT  2010   s/p I and D for osteo   Patient Active Problem List   Diagnosis Date Noted   Lumbar disc disorder with myelopathy 06/01/2021   Unsteady gait 05/27/2021   Fall at home, initial encounter 05/27/2021   Lower extremity weakness 05/26/2021   Obesity (BMI 35.0-39.9 without comorbidity) 05/26/2021   Anxiety 09/12/2017   Bipolar disorder ([place]) 09/12/2017   Insulin dependent diabetes mellitus 09/12/2017   Primary insomnia 09/12/2017   Pure hypercholesterolemia 09/12/2017   Recurrent major depressive disorder, in partial remission ([place])  09/12/2017   Tobacco dependence 09/12/2017   Vitamin D deficiency 09/12/2017   DM type 2 with diabetic peripheral neuropathy ([place]) 04/06/2016   Hypercalcemia 09/13/2015   Hypogonadism in male 09/13/2015   Non morbid obesity due to excess calories 09/13/2015    REFERRING DIAG: M51.06 (ICD-10-CM) - Lumbar disc disorder with myelopathy  THERAPY DIAG:  Muscle weakness (generalized)  Unsteadiness on feet  Other abnormalities of gait and mobility  Rationale for Evaluation and Treatment Rehabilitation  PERTINENT HISTORY: 46 yo Male admitted to the hospital 05/26/2021 - 06/01/2021 at [facility] with lower extremity weakness secondary to peripheral neuropathy and severe spinal stenosis seen on MRI on 05/26/2021. He was discharged to [facility] rehab 06/01/2021 - 06/09/2021. He was discharged to his brother's home. Prior to weakness he was in a 2 story apartment with stairs to enter. Therefore he is currently living at his brothers home which is 2 story but living is on the main floor; he does have 4 steps to enter home with B railings; He reports navigating those steps well. He is currenlty ambulating with RW, able to take a few steps without walker. Reports falling on 12/9 prior to admittance but otherwise no new falls. He denies any significant numbness/tingling; He does have neuropathy in the feet from diabetes but he doesn't  feel that it has worsened. He does have an appointment with Dr. Lacinda Axon (neurosurgeon); He is not sure that they will recommend surgery due to uncontrolled diabetes and concern that surgery may not improve condition;   PRECAUTIONS: Fall   SUBJECTIVE: Pt reports everything has been going ok. Reports some mild soreness in BLEs.  Pt reports no other concerns or current issues. Pt does report some recent stumbles, however. Pt says he stumbled by dragging his R foot but recovered OK and did not fall or get injured. He reported this happened on a regular flat surface while ambulating.  PAIN:   Are you having pain? No   TODAY'S TREATMENT:  Exercise       Time/reps/resistance     Assistance  Charge type  Heel raises seated 1 X 20, 1x25 ea with 9# weight x 3 sec holds   Therex  Pt rates medium  Half-foam side up ankle rocker into DF/PF 20x for each performed on bilat LE  therex To promote maximum ROM  SLS progression - 1 LE on airex, 1 LE on 6 inch step  60 sec ea LE  CGA NMR No LOB but pt rates hard due to balance challenge and fatigue. Bilat LE notably tremulous with intervention. Pt requires seated rest break following intervention.  Seated heel raies without weight 20x bilat LE  Therex To continue to work on ankle mobility and endurance of ankle musculature   Seated DF without weight  20x bilat LE  Therex To continue to work on ankle mobility and endurance of ankle musculature     STS without UE support 2x5 CGA Therex Minor LOB as pt fatigues, he is able to self-correct with step-strategies.   MMT LE grossly 4/5 in BLE with exception of DF 4-/5 B  Therex   Standing endurance without UE support X 3 min SBA Therex Goal met. Pt states, "that wasn't too bad."   Instruction to track amount ambulated per day and try to increase amount by 5 minutes next week   Therex       Rationale for Evaluation and Treatment Rehabilitation    PATIENT EDUCATION: Education details: Pt educated throughout session about proper posture and technique with exercises. Improved exercise technique, movement at target joints, use of target muscles after min to mod verbal, visual, tactile cues. Goal reassessment, indications, plan.  Person educated: Patient Education method: Explanation, Demonstration, and Verbal cues Education comprehension: verbalized understanding and returned demonstration   HOME EXERCISE PROGRAM: No changes at this time    PT Short Term Goals -       PT SHORT TERM GOAL #1   Title Patient will be adherent to HEP at least 3x a week to improve functional strength and balance for  better safety at home.    Baseline 2/27: reports doing them 3-4x a week;    Time 4    Period Weeks    Status Achieved    Target Date 08/10/21      PT SHORT TERM GOAL #2   Title Patient will demonstrate improvement in foot/ankle strength by being able to complete BLE heel raise with minimal UE support for better gait safety and mobility;    Baseline 2/27: unable; 09/11/21: unable, good MMT strength, unable to lift off floor without UE support ; 7/7: unable   Time 4    Period Weeks    Status Not Met    Target Date 12/12/21  PT Long Term Goals -       PT LONG TERM GOAL #1   Title Patient will increase BLE gross strength to 4+/5  especially in hip as to improve functional strength for independent gait, increased standing tolerance and increased ADL ability.    Baseline 2/27: see flowsheet; 09/11/21: see flowsheet, remains limited significantly in ankle strength; 7/7: grossly 4/5 in BLE, exception DF is 4-/5 B   Time 8    Period Weeks    Status Partially Met    Target Date 01/09/22      PT LONG TERM GOAL #2   Title Patient will increase six minute walk test distance to >800 for progression toward community ambulator and improve gait ability    Baseline 2/27: 850 feet with RW    Time 8    Period Weeks    Status Achieved    Target Date 09/07/21      PT LONG TERM GOAL #3   Title Patient will demonstrate an improved Berg Balance Score of > 42/56 as to demonstrate improved balance with ADLs such as sitting/standing and transfer balance and reduced fall risk.    Baseline 2/27: 25/56; 09/11/21: 37/56 4/24: 41/56; 7/7: deferred   Time 8    Period Weeks    Status On-going    Target Date 01/09/22      PT LONG TERM GOAL #4   Title Patient will reduce timed up and go to <14 seconds to reduce fall risk and demonstrate improved transfer/gait ability.    Baseline 2/27: 9.48 sec with RW;    Time 8    Period Weeks    Status Achieved    Target Date 09/07/21      PT LONG TERM  GOAL #5   Title Pt will be able to tolerate standing >/= 3 min without UE support and without LOB to demonstrate ability to complete grooming/bathing tasks and other standing ADL's without LOB.    Baseline 09/11/21: 1 min 45 sec 10/09/21: 3 min but 1 LOB with step correction and significant fatigue and LE trembling; 7/7: achieved   Time 8    Period Weeks    Status Achieved    Target Date 01/09/22              Plan - 12/13/21 2751     Clinical Impression Statement Goals retested for progress note and PT continued with current plan of care as laid out in evaluation and recent prior sessions. Pt making progress toward goals with achieving standing endurance goal, but with similar performance on strength testing. Pt continues to have greatest difficulty with strength and endurance of ankle musculature.  The pt will benefit from further skilled PT to continue to improve strength, endurance, gait, balance and mobility.    Personal Factors and Comorbidities Comorbidity 3+;Fitness;Time since onset of injury/illness/exacerbation    Comorbidities PMH Significant: Obesity, Anxiety/depression, bipolar disorder, Type 2 diabetes with neuropathy, insomnia, HTN, Lumbar stenosis    Examination-Activity Limitations Caring for Others;Carry;Locomotion Level;Squat;Stairs;Stand;Transfers    Examination-Participation Restrictions Cleaning;Community Activity;Driving;Occupation;Shop;Volunteer;Yard Work    Stability/Clinical Decision Making Stable/Uncomplicated    Rehab Potential Fair    PT Frequency 2x / week    PT Duration 8 weeks    PT Treatment/Interventions ADLs/Self Care Home Management;Cryotherapy;Moist Heat;Gait training;Stair training;Functional mobility training;Therapeutic activities;Therapeutic exercise;Balance training;Neuromuscular re-education;Patient/family education;Orthotic Fit/Training;Manual techniques    PT Next Visit Plan Continue with strength/balance    PT Home Exercise Plan 07/26/2021=Access  Code: [email or web address]; no updates    Consulted and  Agree with Plan of Care Patient               Zollie Pee, PT 12/22/2021, 10:36 AM

## 2021-12-26 ENCOUNTER — Ambulatory Visit: Payer: Medicare Other

## 2021-12-26 ENCOUNTER — Encounter: Payer: Medicare Other | Admitting: Occupational Therapy

## 2021-12-26 DIAGNOSIS — R2681 Unsteadiness on feet: Secondary | ICD-10-CM

## 2021-12-26 DIAGNOSIS — R269 Unspecified abnormalities of gait and mobility: Secondary | ICD-10-CM

## 2021-12-26 DIAGNOSIS — M5106 Intervertebral disc disorders with myelopathy, lumbar region: Secondary | ICD-10-CM

## 2021-12-26 DIAGNOSIS — R262 Difficulty in walking, not elsewhere classified: Secondary | ICD-10-CM

## 2021-12-26 DIAGNOSIS — R278 Other lack of coordination: Secondary | ICD-10-CM

## 2021-12-26 DIAGNOSIS — M6281 Muscle weakness (generalized): Secondary | ICD-10-CM

## 2021-12-26 DIAGNOSIS — R2689 Other abnormalities of gait and mobility: Secondary | ICD-10-CM

## 2021-12-26 NOTE — Therapy (Signed)
[place] MAIN [facility] SERVICES [address] [place], [place], [address] Phone: [phone]   Fax:  [phone]  Physical Therapy Treatment  Patient Details  Name: Jerry Wheeler MRN: [id number] Date of Birth: May 13, 1976 Referring Provider (PT): Reesa Chew PA/ Netty Starring PCP   Encounter Date: 12/26/2021   PT End of Session - 12/26/21 0943     Visit Number 31    Number of Visits 36    Date for PT Re-Evaluation 01/09/22    Authorization Type Medicare    Authorization Time Period 11/14/21-01/09/22    PT Start Time 0937    PT Stop Time 1015    PT Time Calculation (min) 38 min    Activity Tolerance Patient tolerated treatment well;No increased pain    Behavior During Therapy WFL for tasks assessed/performed             Past Medical History:  Diagnosis Date   Anxiety disorder    Bipolar disorder ([place])    Diabetes mellitus ([place])    Diabetic peripheral neuropathy associated with type 2 diabetes mellitus ([place])    Emphysema lung ([place])    hospitalization 1999   High blood pressure    Major depressive disorder in partial remission ([place])    Morbid obesity ([place])    Osteomyelitis of great toe of left foot ([place])    Sleep apnea    Tobacco use disorder, continuous    Ulcer of right heel ([place])    followed by podiatry--fully healed 09/2020    Past Surgical History:  Procedure Laterality Date   DG GREAT TOE LEFT FOOT  2010   s/p I and D for osteo    There were no vitals filed for this visit.   Subjective Assessment - 12/26/21 0942     Subjective Pt doing ok today. Denies any updates since past session.    Pertinent History 46 yo Male admitted to the hospital 05/26/2021 - 06/01/2021 at [facility] with lower extremity weakness secondary to peripheral neuropathy and severe spinal stenosis seen on MRI on 05/26/2021. He was discharged to [facility] rehab 06/01/2021 - 06/09/2021. He was discharged to his brother's home. Prior to weakness he was in a 2  story apartment with stairs to enter. Therefore he is currently living at his brothers home which is 2 story but living is on the main floor; he does have 4 steps to enter home with B railings; He reports navigating those steps well. He is currenlty ambulating with RW, able to take a few steps without walker. Reports falling on 12/9 prior to admittance but otherwise no new falls. He denies any significant numbness/tingling; He does have neuropathy in the feet from diabetes but he doesn't feel that it has worsened. He does have an appointment with Dr. Lacinda Axon (neurosurgeon); He is not sure that they will recommend surgery due to uncontrolled diabetes and concern that surgery may not improve condition;    Currently in Pain? No/denies             INTERVENTION THIS DATE:      Ambulation interval with STS  2 SETS (10 X STS --> 325 feet amb)  CGA  Therex  Pt complete tight turns while ambulating around objects and without QC this session, pt reports as difficult and fatiguing   Prolonged stance on airex  2 X 1:30 minute      No UE assistance therex To improve standing balance and endurance.   Heel raises seated  2 X 15  ea with 10# AW bilat    Therex  Toes on half foam roll to access full ROM  Ankle DF seated 2x15, 2lbAW at metatarsal line  Therex  Heels on half foam to access full ROM   Seated LAQ  2x10 c 10lb AW   Therex Feet dangling   Seated Marching, core stabilization  2x30, alternating, 2lb AW bilat  Therex Cues for core stabilizaton   Seated BLE hip IR  2x15 YTB  Therex Peanut for knee spacer  Seated deadlift 1x15 c basketall to floor  Therex              PT Education - 12/26/21 0958     Education Details use of roll for ROM assistance    Person(s) Educated Patient    Methods Explanation    Comprehension Verbalized understanding;Returned demonstration              PT Short Term Goals - 11/14/21 1112       PT SHORT TERM GOAL #1   Title Patient will be adherent to HEP at  least 3x a week to improve functional strength and balance for better safety at home.    Baseline 2/27: reports doing them 3-4x a week;    Time 4    Period Weeks    Status Achieved    Target Date 08/10/21      PT SHORT TERM GOAL #2   Title Patient will demonstrate improvement in foot/ankle strength by being able to complete BLE heel raise with minimal UE support for better gait safety and mobility;    Baseline 2/27: unable; 09/11/21: unable, good MMT strength, unable to lift off floor without UE support    Time 4    Period Weeks    Status Not Met    Target Date 12/12/21               PT Long Term Goals - 11/14/21 1112       PT LONG TERM GOAL #1   Title Patient will increase BLE gross strength to 4+/5  especially in hip as to improve functional strength for independent gait, increased standing tolerance and increased ADL ability.    Baseline 2/27: see flowsheet; 09/11/21: see flowsheet, remains limited significantly in ankle strength    Time 8    Period Weeks    Status Partially Met    Target Date 01/09/22      PT LONG TERM GOAL #2   Title Patient will increase six minute walk test distance to >800 for progression toward community ambulator and improve gait ability    Baseline 2/27: 850 feet with RW    Time 8    Period Weeks    Status Achieved    Target Date 09/07/21      PT LONG TERM GOAL #3   Title Patient will demonstrate an improved Berg Balance Score of > 42/56 as to demonstrate improved balance with ADLs such as sitting/standing and transfer balance and reduced fall risk.    Baseline 2/27: 25/56; 09/11/21: 37/56 4/24: 41/56    Time 8    Period Weeks    Status On-going    Target Date 01/09/22      PT LONG TERM GOAL #4   Title Patient will reduce timed up and go to <14 seconds to reduce fall risk and demonstrate improved transfer/gait ability.    Baseline 2/27: 9.48 sec with RW;    Time 8    Period Weeks  Status Achieved    Target Date 09/07/21      PT LONG  TERM GOAL #5   Title Pt will be able to tolerate standing >/= 3 min without UE support and without LOB to demonstrate ability to complete grooming/bathing tasks and other standing ADL's without LOB.    Baseline 09/11/21: 1 min 45 sec 10/09/21: 3 min but 1 LOB with step correction and significant fatigue and LE trembling    Time 8    Period Weeks    Status Revised    Target Date 01/09/22                   Plan - 12/26/21 0958     Clinical Impression Statement Continued with generalized strengthening. Pt tolerates session well. Able to advance reps and/or resistance level in a few area. No LOB in session.    Personal Factors and Comorbidities Comorbidity 3+;Fitness;Time since onset of injury/illness/exacerbation    Comorbidities PMH Significant: Obesity, Anxiety/depression, bipolar disorder, Type 2 diabetes with neuropathy, insomnia, HTN, Lumbar stenosis    Examination-Activity Limitations Caring for Others;Carry;Locomotion Level;Squat;Stairs;Stand;Transfers    Examination-Participation Restrictions Cleaning;Community Activity;Driving;Occupation;Shop;Volunteer;Yard Work    Stability/Clinical Decision Making Stable/Uncomplicated    Designer, jewellery Low    Rehab Potential Good    PT Frequency 2x / week    PT Duration 8 weeks    PT Treatment/Interventions ADLs/Self Care Home Management;Cryotherapy;Moist Heat;Gait training;Stair training;Functional mobility training;Therapeutic activities;Therapeutic exercise;Balance training;Neuromuscular re-education;Patient/family education;Orthotic Fit/Training;Manual techniques    PT Next Visit Plan Continue with strength/balance    PT Home Exercise Plan 07/26/2021=Access Code: [email or web address]; no updates    Consulted and Agree with Plan of Care Patient             Patient will benefit from skilled therapeutic intervention in order to improve the following deficits and impairments:  Abnormal gait, Decreased balance, Decreased endurance,  Decreased mobility, Difficulty walking, Impaired sensation, Obesity, Increased edema, Decreased activity tolerance, Decreased safety awareness, Decreased strength  Visit Diagnosis: Muscle weakness (generalized)  Unsteadiness on feet  Other abnormalities of gait and mobility  Abnormality of gait and mobility  Difficulty in walking, not elsewhere classified  Other lack of coordination  Lumbar disc disorder with myelopathy     Problem List Patient Active Problem List   Diagnosis Date Noted   Lumbar disc disorder with myelopathy 06/01/2021   Unsteady gait 05/27/2021   Fall at home, initial encounter 05/27/2021   Lower extremity weakness 05/26/2021   Obesity (BMI 35.0-39.9 without comorbidity) 05/26/2021   Anxiety 09/12/2017   Bipolar disorder ([place]) 09/12/2017   Insulin dependent diabetes mellitus 09/12/2017   Primary insomnia 09/12/2017   Pure hypercholesterolemia 09/12/2017   Recurrent major depressive disorder, in partial remission ([place]) 09/12/2017   Tobacco dependence 09/12/2017   Vitamin D deficiency 09/12/2017   DM type 2 with diabetic peripheral neuropathy ([place]) 04/06/2016   Hypercalcemia 09/13/2015   Hypogonadism in male 09/13/2015   Non morbid obesity due to excess calories 09/13/2015   10:16 AM, 12/26/21 Etta Grandchild, PT, DPT Physical Therapist - [facility]  Outpatient Physical Therapy- [place] [phone]     [place], PT 12/26/2021, 10:03 AM  [place] MAIN [facility] SERVICES [address] [place], [place], [address] Phone: [phone]   Fax:  [phone]  Name: Britt Petroni MRN: [id number] Date of Birth: 1975-09-02

## 2021-12-28 ENCOUNTER — Ambulatory Visit: Payer: Medicare Other | Admitting: Physical Therapy

## 2022-01-01 ENCOUNTER — Ambulatory Visit: Payer: Medicare Other

## 2022-01-02 ENCOUNTER — Ambulatory Visit: Payer: Medicare Other

## 2022-01-03 ENCOUNTER — Ambulatory Visit: Payer: Medicare Other | Admitting: Physical Therapy

## 2022-01-05 ENCOUNTER — Ambulatory Visit: Payer: Medicare Other | Admitting: Physical Therapy

## 2022-01-08 ENCOUNTER — Ambulatory Visit: Payer: Medicare Other | Admitting: Physical Therapy

## 2022-01-09 ENCOUNTER — Ambulatory Visit: Payer: Medicare Other | Admitting: Physical Therapy

## 2022-01-09 DIAGNOSIS — R2689 Other abnormalities of gait and mobility: Secondary | ICD-10-CM

## 2022-01-09 DIAGNOSIS — M6281 Muscle weakness (generalized): Secondary | ICD-10-CM

## 2022-01-09 DIAGNOSIS — R269 Unspecified abnormalities of gait and mobility: Secondary | ICD-10-CM

## 2022-01-09 DIAGNOSIS — R2681 Unsteadiness on feet: Secondary | ICD-10-CM

## 2022-01-09 DIAGNOSIS — R262 Difficulty in walking, not elsewhere classified: Secondary | ICD-10-CM

## 2022-01-09 NOTE — Therapy (Addendum)
[place] MAIN  [facility] SERVICES [address] [place], [place], [address] Phone: [phone]   Fax:  [phone]  Physical Therapy Recert   Patient Details  Name: Jerry Wheeler MRN: [id number] Date of Birth: Nov 03, 1975 Referring Provider (PT): Reesa Chew PA/ Netty Starring PCP   Encounter Date: 01/09/2022   PT End of Session - 01/09/22 0808     Visit Number 32    Number of Visits 56    Date for PT Re-Evaluation 01/09/22    Authorization Type Medicare    Authorization Time Period 11/14/21-01/09/22    PT Start Time 0805    PT Stop Time 0845    PT Time Calculation (min) 40 min    Equipment Utilized During Treatment Gait belt    Activity Tolerance Patient tolerated treatment well;No increased pain    Behavior During Therapy WFL for tasks assessed/performed             Past Medical History:  Diagnosis Date   Anxiety disorder    Bipolar disorder ([place])    Diabetes mellitus ([place])    Diabetic peripheral neuropathy associated with type 2 diabetes mellitus ([place])    Emphysema lung ([place])    hospitalization 1999   High blood pressure    Major depressive disorder in partial remission ([place])    Morbid obesity ([place])    Osteomyelitis of great toe of left foot ([place])    Sleep apnea    Tobacco use disorder, continuous    Ulcer of right heel ([place])    followed by podiatry--fully healed 09/2020    Past Surgical History:  Procedure Laterality Date   DG GREAT TOE LEFT FOOT  2010   s/p I and D for osteo    There were no vitals filed for this visit.     INTERVENTION THIS DATE: Physical therapy treatment session today consisted of completing assessment of goals and administration of testing as demonstrated in flow sheet. Addition treatments may be found below.       Leg press, adjusted for height 2 x 10 with 70#, some soreness noted in lateral hip region with this activity.    [facility] PT Assessment - 01/09/22 0001       Berg Balance Test   Sit  to Stand Able to stand  independently using hands    Standing Unsupported Able to stand safely 2 minutes    Sitting with Back Unsupported but Feet Supported on Floor or Stool Able to sit safely and securely 2 minutes    Stand to Sit Sits safely with minimal use of hands    Transfers Able to transfer safely, minor use of hands    Standing Unsupported with Eyes Closed Able to stand 10 seconds safely    Standing Unsupported with Feet Together Able to place feet together independently and stand 1 minute safely    From Standing, Reach Forward with Outstretched Arm Can reach confidently >25 cm (10")    From Standing Position, Pick up Object from Floor Able to pick up shoe, needs supervision    From Standing Position, Turn to Look Behind Over each Shoulder Turn sideways only but maintains balance    Turn 360 Degrees Able to turn 360 degrees safely but slowly    Standing Unsupported, Alternately Place Feet on Step/Stool Able to complete 4 steps without aid or supervision    Standing Unsupported, One Foot in Front Able to take small step independently and hold 30 seconds    Standing on One Leg  Tries to lift leg/unable to hold 3 seconds but remains standing independently    Total Score 43             6 minute walk test: 890 feet. No walker first 300 feet, used walker remainder of test ( pt had one stumble without walker). Walker placed along walking path for convenience if this occurred.           PT Short Term Goals -      PT SHORT TERM GOAL #1   Title Patient will be adherent to HEP at least 3x a week to improve functional strength and balance for better safety at home.    Baseline 2/27: reports doing them 3-4x a week;    Time 4    Period Weeks    Status Achieved    Target Date 08/10/21      PT SHORT TERM GOAL #2   Title Patient will demonstrate improvement in foot/ankle strength by being able to complete BLE heel raise with minimal UE support for better gait safety and mobility;     Baseline 2/27: unable; 09/11/21: unable, good MMT strength, unable to lift off floor without UE support    Time 4    Period Weeks    Status Not Met    Target Date 12/12/21               PT Long Term Goals - target date for all active goals 04/03/22      PT LONG TERM GOAL #1   Title Patient will increase BLE gross strength to 4+/5  especially in hip as to improve functional strength for independent gait, increased standing tolerance and increased ADL ability.    Baseline 2/27: see flowsheet; 09/11/21: see flowsheet, remains limited significantly in ankle strength    Time 8    Period Weeks    Status Partially Met    Target Date 01/09/22      PT LONG TERM GOAL #2   Title Patient will increase six minute walk test distance to >800 for progression toward community ambulator and improve gait ability    Baseline 2/27: 850 feet with RW    Time 8    Period Weeks    Status Achieved    Target Date 09/07/21      PT LONG TERM GOAL #3   Title Patient will demonstrate an improved Berg Balance Score of > 42/56 as to demonstrate improved balance with ADLs such as sitting/standing and transfer balance and reduced fall risk.    Baseline 2/27: 25/56; 09/11/21: 37/56 4/24: 41/56 7/25: 43   Time 8    Period Weeks    Status On-going    Target Date 01/09/22      PT LONG TERM GOAL #4   Title Patient will reduce timed up and go to <14 seconds to reduce fall risk and demonstrate improved transfer/gait ability.    Baseline 2/27: 9.48 sec with RW;    Time 8    Period Weeks    Status Achieved    Target Date 09/07/21      PT LONG TERM GOAL #5   Title Pt will be able to tolerate standing >/= 3 min without UE support and without LOB to demonstrate ability to complete grooming/bathing tasks and other standing ADL's without LOB.    Baseline 09/11/21: 1 min 45 sec 10/09/21: 3 min but 1 LOB with step correction and significant fatigue and LE trembling    Time 8  Period Weeks    Status Revised     Target Date 01/09/22            6.  Pt will ambulate 1000 feet or greater in 6MWT to indicate safety with community ambulation  Baseline: 890 With RW   Goal status: INITIAL  7.  Pt will ambulate 450 feet or greater without AD in order to progress his ability to ambulate in his community and in environment accessing his home safely  Baseline: 300 feet 1 LOB with self correction  Goal status: INITIAL  8.  Pt will improve BERG balance test to 45 or greater to indicate decreased risk of falls.  Baseline: 43 on 7/25 Goal status: New/ Progressed from previous goal             Plan - 12/26/21 0958     Clinical Impression Statement Patient presents to physical therapy for progress note to this date.  Patient demonstrates good progress with his Merrilee Jansky balance test indicating decreased risk of falls as well as progress with his 6-minute walk test from previous testing earlier in treatment bout.  Patient did have fatigue following ambulation without assistive device for 300 feet with 1 loss of balance corrected with stepping strategy.  Patient completed remainder of 6-minute walk test with rolling walker.  Continue to progress his endurance ambulating with rolling walker for community based ambulation as well as with his safety and endurance with ambulating without assistive device for safe ear home and around his home access.  Patient continue to make progress with physical therapy, patient has not made significant progress with his ankle strength but will continue to work on this during his home program as well as in future sessions as is appropriate.  Patient will continue to benefit with skilled physical therapy in order to improve his lower extremity strength, endurance, mobility, quality of life.   Personal Factors and Comorbidities Comorbidity 3+;Fitness;Time since onset of injury/illness/exacerbation    Comorbidities PMH Significant: Obesity, Anxiety/depression, bipolar disorder, Type 2  diabetes with neuropathy, insomnia, HTN, Lumbar stenosis    Examination-Activity Limitations Caring for Others;Carry;Locomotion Level;Squat;Stairs;Stand;Transfers    Examination-Participation Restrictions Cleaning;Community Activity;Driving;Occupation;Shop;Volunteer;Yard Work    Stability/Clinical Decision Making Stable/Uncomplicated    Designer, jewellery Low    Rehab Potential Good    PT Frequency 2x / week    PT Duration 8 weeks    PT Treatment/Interventions ADLs/Self Care Home Management;Cryotherapy;Moist Heat;Gait training;Stair training;Functional mobility training;Therapeutic activities;Therapeutic exercise;Balance training;Neuromuscular re-education;Patient/family education;Orthotic Fit/Training;Manual techniques    PT Next Visit Plan Continue with strength/balance    PT Home Exercise Plan 07/26/2021=Access Code: [email or web address]; no updates    Consulted and Agree with Plan of Care Patient             Patient will benefit from skilled therapeutic intervention in order to improve the following deficits and impairments:     Visit Diagnosis: Muscle weakness (generalized)  Unsteadiness on feet  Other abnormalities of gait and mobility  Abnormality of gait and mobility  Difficulty in walking, not elsewhere classified     Problem List Patient Active Problem List   Diagnosis Date Noted   Lumbar disc disorder with myelopathy 06/01/2021   Unsteady gait 05/27/2021   Fall at home, initial encounter 05/27/2021   Lower extremity weakness 05/26/2021   Obesity (BMI 35.0-39.9 without comorbidity) 05/26/2021   Anxiety 09/12/2017   Bipolar disorder ([place]) 09/12/2017   Insulin dependent diabetes mellitus 09/12/2017   Primary insomnia 09/12/2017   Pure hypercholesterolemia 09/12/2017  Recurrent major depressive disorder, in partial remission ([place]) 09/12/2017   Tobacco dependence 09/12/2017   Vitamin D deficiency 09/12/2017   DM type 2 with diabetic peripheral neuropathy ([place])  04/06/2016   Hypercalcemia 09/13/2015   Hypogonadism in male 09/13/2015   Non morbid obesity due to excess calories 09/13/2015   11:08 AM, 01/09/22   Particia Lather, PT 01/09/2022, 11:08 AM  [place] [address] [place], [place], [address] Phone: [phone]   Fax:  [phone]  Name: Shanna Strength MRN: [id number] Date of Birth: May 22, 1976

## 2022-01-10 ENCOUNTER — Ambulatory Visit: Payer: Medicare Other | Admitting: Physical Therapy

## 2022-01-11 ENCOUNTER — Ambulatory Visit: Payer: Medicare Other | Admitting: Physical Therapy

## 2022-01-15 ENCOUNTER — Ambulatory Visit: Payer: Medicare Other | Admitting: Physical Therapy

## 2022-01-17 ENCOUNTER — Ambulatory Visit: Payer: Medicare Other | Admitting: Physical Therapy

## 2022-01-18 NOTE — Addendum Note (Signed)
Addended by: Thresa Ross B on: 01/18/2022 05:16 PM   Modules accepted: Orders

## 2022-01-22 NOTE — Therapy (Deleted)
[place] MAIN [facility] SERVICES [address] [place], [place], [address] Phone: [phone]   Fax:  [phone]  Physical Therapy Treatment  Patient Details  Name: Jerry Wheeler MRN: [id number] Date of Birth: Aug 09, 1975 Referring Provider (PT): Reesa Chew PA/ Netty Starring PCP   Encounter Date: 01/23/2022     Past Medical History:  Diagnosis Date   Anxiety disorder    Bipolar disorder ([place])    Diabetes mellitus ([place])    Diabetic peripheral neuropathy associated with type 2 diabetes mellitus ([place])    Emphysema lung ([place])    hospitalization 1999   High blood pressure    Major depressive disorder in partial remission ([place])    Morbid obesity ([place])    Osteomyelitis of great toe of left foot ([place])    Sleep apnea    Tobacco use disorder, continuous    Ulcer of right heel ([place])    followed by podiatry--fully healed 09/2020    Past Surgical History:  Procedure Laterality Date   DG GREAT TOE LEFT FOOT  2010   s/p I and D for osteo    There were no vitals filed for this visit.     INTERVENTION THIS DATE: Physical therapy treatment session today consisted of completing assessment of goals and administration of testing as demonstrated in flow sheet. Addition treatments may be found below.       Leg press, adjusted for height 2 x 10 with 70#, some soreness noted in lateral hip region with this activity.      6 minute walk test: 890 feet. No walker first 300 feet, used walker remainder of test ( pt had one stumble without walker). Walker placed along walking path for convenience if this occurred.           PT Short Term Goals -      PT SHORT TERM GOAL #1   Title Patient will be adherent to HEP at least 3x a week to improve functional strength and balance for better safety at home.    Baseline 2/27: reports doing them 3-4x a week;    Time 4    Period Weeks    Status Achieved    Target Date 08/10/21      PT SHORT TERM GOAL  #2   Title Patient will demonstrate improvement in foot/ankle strength by being able to complete BLE heel raise with minimal UE support for better gait safety and mobility;    Baseline 2/27: unable; 09/11/21: unable, good MMT strength, unable to lift off floor without UE support    Time 4    Period Weeks    Status Not Met    Target Date 12/12/21               PT Long Term Goals -      PT LONG TERM GOAL #1   Title Patient will increase BLE gross strength to 4+/5  especially in hip as to improve functional strength for independent gait, increased standing tolerance and increased ADL ability.    Baseline 2/27: see flowsheet; 09/11/21: see flowsheet, remains limited significantly in ankle strength    Time 8    Period Weeks    Status Partially Met    Target Date 01/09/22      PT LONG TERM GOAL #2   Title Patient will increase six minute walk test distance to >800 for progression toward community ambulator and improve gait ability    Baseline 2/27: 850 feet with RW  Time 8    Period Weeks    Status Achieved    Target Date 09/07/21      PT LONG TERM GOAL #3   Title Patient will demonstrate an improved Berg Balance Score of > 42/56 as to demonstrate improved balance with ADLs such as sitting/standing and transfer balance and reduced fall risk.    Baseline 2/27: 25/56; 09/11/21: 37/56 4/24: 41/56 7/25: 43   Time 8    Period Weeks    Status On-going    Target Date 01/09/22      PT LONG TERM GOAL #4   Title Patient will reduce timed up and go to <14 seconds to reduce fall risk and demonstrate improved transfer/gait ability.    Baseline 2/27: 9.48 sec with RW;    Time 8    Period Weeks    Status Achieved    Target Date 09/07/21      PT LONG TERM GOAL #5   Title Pt will be able to tolerate standing >/= 3 min without UE support and without LOB to demonstrate ability to complete grooming/bathing tasks and other standing ADL's without LOB.    Baseline 09/11/21: 1 min 45 sec 10/09/21:  3 min but 1 LOB with step correction and significant fatigue and LE trembling    Time 8    Period Weeks    Status Revised    Target Date 01/09/22            6.  Pt will ambulate 1000 feet or greater in 6MWT to indicate safety with community ambulation  Baseline: 890 With RW   Goal status: INITIAL  7.  Pt will ambulate 450 feet or greater without AD in order to progress his ability to ambulate in his community and in environment accessing his home safely  Baseline: 300 feet 1 LOB with self correction  Goal status: INITIAL  8.  Pt will improve BERG balance test to 45 or greater to indicate decreased risk of falls.  Baseline: 43 on 7/25 Goal status: New/ Progressed from previous goal             Plan - 12/26/21 0958     Clinical Impression Statement Patient presents to physical therapy for progress note to this date.  Patient demonstrates good progress with his Merrilee Jansky balance test indicating decreased risk of falls as well as progress with his 6-minute walk test from previous testing earlier in treatment bout.  Patient did have fatigue following ambulation without assistive device for 300 feet with 1 loss of balance corrected with stepping strategy.  Patient completed remainder of 6-minute walk test with rolling walker.  Continue to progress his endurance ambulating with rolling walker for community based ambulation as well as with his safety and endurance with ambulating without assistive device for safe ear home and around his home access.  Patient continue to make progress with physical therapy, patient has not made significant progress with his ankle strength but will continue to work on this during his home program as well as in future sessions as is appropriate.  Patient will continue to benefit with skilled physical therapy in order to improve his lower extremity strength, endurance, mobility, quality of life.   Personal Factors and Comorbidities Comorbidity 3+;Fitness;Time  since onset of injury/illness/exacerbation    Comorbidities PMH Significant: Obesity, Anxiety/depression, bipolar disorder, Type 2 diabetes with neuropathy, insomnia, HTN, Lumbar stenosis    Examination-Activity Limitations Caring for Others;Carry;Locomotion Level;Squat;Stairs;Stand;Transfers    Examination-Participation Restrictions Cleaning;Community Activity;Driving;Occupation;Shop;Volunteer;[employer] Work  Stability/Clinical Decision Making Stable/Uncomplicated    Clinical Decision Making Low    Rehab Potential Good    PT Frequency 2x / week    PT Duration 8 weeks    PT Treatment/Interventions ADLs/Self Care Home Management;Cryotherapy;Moist Heat;Gait training;Stair training;Functional mobility training;Therapeutic activities;Therapeutic exercise;Balance training;Neuromuscular re-education;Patient/family education;Orthotic Fit/Training;Manual techniques    PT Next Visit Plan Continue with strength/balance    PT Home Exercise Plan 07/26/2021=Access Code: [email or web address]; no updates    Consulted and Agree with Plan of Care Patient             Patient will benefit from skilled therapeutic intervention in order to improve the following deficits and impairments:     Visit Diagnosis: No diagnosis found.     Problem List Patient Active Problem List   Diagnosis Date Noted   Lumbar disc disorder with myelopathy 06/01/2021   Unsteady gait 05/27/2021   Fall at home, initial encounter 05/27/2021   Lower extremity weakness 05/26/2021   Obesity (BMI 35.0-39.9 without comorbidity) 05/26/2021   Anxiety 09/12/2017   Bipolar disorder ([place]) 09/12/2017   Insulin dependent diabetes mellitus 09/12/2017   Primary insomnia 09/12/2017   Pure hypercholesterolemia 09/12/2017   Recurrent major depressive disorder, in partial remission ([place]) 09/12/2017   Tobacco dependence 09/12/2017   Vitamin D deficiency 09/12/2017   DM type 2 with diabetic peripheral neuropathy ([place]) 04/06/2016   Hypercalcemia 09/13/2015    Hypogonadism in male 09/13/2015   Non morbid obesity due to excess calories 09/13/2015   5:23 PM, 01/22/22   Particia Lather, PT 01/22/2022, 5:23 PM  [place] MAIN [facility] SERVICES [address] [place], [place], [address] Phone: [phone]   Fax:  [phone]  Name: Gaelan Glennon MRN: [id number] Date of Birth: [birth year]

## 2022-01-23 ENCOUNTER — Ambulatory Visit: Payer: Medicare Other | Admitting: Physical Therapy

## 2022-01-25 ENCOUNTER — Ambulatory Visit: Payer: Medicare Other | Attending: Physical Medicine and Rehabilitation

## 2022-01-30 ENCOUNTER — Ambulatory Visit: Payer: Medicare Other

## 2022-02-01 ENCOUNTER — Ambulatory Visit: Payer: Medicare Other

## 2022-02-06 ENCOUNTER — Ambulatory Visit: Payer: Medicare Other | Admitting: Physical Therapy

## 2022-02-06 ENCOUNTER — Telehealth: Payer: Self-pay | Admitting: Physical Therapy

## 2022-02-06 NOTE — Therapy (Deleted)
[place] MAIN [facility] SERVICES [address] [place], [place], [address] Phone: [phone]   Fax:  [phone]  Physical Therapy Treatment  Patient Details  Name: Jerry Wheeler MRN: [id number] Date of Birth: 1976/05/22 Referring Provider (PT): Reesa Chew PA/ Netty Starring PCP   Encounter Date: 02/06/2022     Past Medical History:  Diagnosis Date   Anxiety disorder    Bipolar disorder ([place])    Diabetes mellitus ([place])    Diabetic peripheral neuropathy associated with type 2 diabetes mellitus ([place])    Emphysema lung ([place])    hospitalization 1999   High blood pressure    Major depressive disorder in partial remission ([place])    Morbid obesity ([place])    Osteomyelitis of great toe of left foot ([place])    Sleep apnea    Tobacco use disorder, continuous    Ulcer of right heel ([place])    followed by podiatry--fully healed 09/2020    Past Surgical History:  Procedure Laterality Date   DG GREAT TOE LEFT FOOT  2010   s/p I and D for osteo    There were no vitals filed for this visit.     INTERVENTION THIS DATE: Physical therapy treatment session today consisted of completing assessment of goals and administration of testing as demonstrated in flow sheet. Addition treatments may be found below.       Leg press, adjusted for height 2 x 10 with 70#,            PT Short Term Goals -      PT SHORT TERM GOAL #1   Title Patient will be adherent to HEP at least 3x a week to improve functional strength and balance for better safety at home.    Baseline 2/27: reports doing them 3-4x a week;    Time 4    Period Weeks    Status Achieved    Target Date 08/10/21      PT SHORT TERM GOAL #2   Title Patient will demonstrate improvement in foot/ankle strength by being able to complete BLE heel raise with minimal UE support for better gait safety and mobility;    Baseline 2/27: unable; 09/11/21: unable, good MMT strength, unable to lift off  floor without UE support    Time 4    Period Weeks    Status Not Met    Target Date 12/12/21               PT Long Term Goals -      PT LONG TERM GOAL #1   Title Patient will increase BLE gross strength to 4+/5  especially in hip as to improve functional strength for independent gait, increased standing tolerance and increased ADL ability.    Baseline 2/27: see flowsheet; 09/11/21: see flowsheet, remains limited significantly in ankle strength    Time 8    Period Weeks    Status Partially Met    Target Date 01/09/22      PT LONG TERM GOAL #2   Title Patient will increase six minute walk test distance to >800 for progression toward community ambulator and improve gait ability    Baseline 2/27: 850 feet with RW    Time 8    Period Weeks    Status Achieved    Target Date 09/07/21      PT LONG TERM GOAL #3   Title Patient will demonstrate an improved Berg Balance Score of > 42/56 as to demonstrate improved  balance with ADLs such as sitting/standing and transfer balance and reduced fall risk.    Baseline 2/27: 25/56; 09/11/21: 37/56 4/24: 41/56 7/25: 43   Time 8    Period Weeks    Status On-going    Target Date 01/09/22      PT LONG TERM GOAL #4   Title Patient will reduce timed up and go to <14 seconds to reduce fall risk and demonstrate improved transfer/gait ability.    Baseline 2/27: 9.48 sec with RW;    Time 8    Period Weeks    Status Achieved    Target Date 09/07/21      PT LONG TERM GOAL #5   Title Pt will be able to tolerate standing >/= 3 min without UE support and without LOB to demonstrate ability to complete grooming/bathing tasks and other standing ADL's without LOB.    Baseline 09/11/21: 1 min 45 sec 10/09/21: 3 min but 1 LOB with step correction and significant fatigue and LE trembling    Time 8    Period Weeks    Status Revised    Target Date 01/09/22            6.  Pt will ambulate 1000 feet or greater in 6MWT to indicate safety with community  ambulation  Baseline: 890 With RW   Goal status: INITIAL  7.  Pt will ambulate 450 feet or greater without AD in order to progress his ability to ambulate in his community and in environment accessing his home safely  Baseline: 300 feet 1 LOB with self correction  Goal status: INITIAL  8.  Pt will improve BERG balance test to 45 or greater to indicate decreased risk of falls.  Baseline: 43 on 7/25 Goal status: New/ Progressed from previous goal             Plan - 12/26/21 0958     Clinical Impression Statement Patient presents to physical therapy for progress note to this date.  Patient demonstrates good progress with his Merrilee Jansky balance test indicating decreased risk of falls as well as progress with his 6-minute walk test from previous testing earlier in treatment bout.  Patient did have fatigue following ambulation without assistive device for 300 feet with 1 loss of balance corrected with stepping strategy.  Patient completed remainder of 6-minute walk test with rolling walker.  Continue to progress his endurance ambulating with rolling walker for community based ambulation as well as with his safety and endurance with ambulating without assistive device for safe ear home and around his home access.  Patient continue to make progress with physical therapy, patient has not made significant progress with his ankle strength but will continue to work on this during his home program as well as in future sessions as is appropriate.  Patient will continue to benefit with skilled physical therapy in order to improve his lower extremity strength, endurance, mobility, quality of life.   Personal Factors and Comorbidities Comorbidity 3+;Fitness;Time since onset of injury/illness/exacerbation    Comorbidities PMH Significant: Obesity, Anxiety/depression, bipolar disorder, Type 2 diabetes with neuropathy, insomnia, HTN, Lumbar stenosis    Examination-Activity Limitations Caring for  Others;Carry;Locomotion Level;Squat;Stairs;Stand;Transfers    Examination-Participation Restrictions Cleaning;Community Activity;Driving;Occupation;Shop;Volunteer;Yard Work    Stability/Clinical Decision Making Stable/Uncomplicated    Designer, jewellery Low    Rehab Potential Good    PT Frequency 2x / week    PT Duration 8 weeks    PT Treatment/Interventions ADLs/Self Care Home Management;Cryotherapy;Moist Heat;Gait training;Stair training;Functional mobility  training;Therapeutic activities;Therapeutic exercise;Balance training;Neuromuscular re-education;Patient/family education;Orthotic Fit/Training;Manual techniques    PT Next Visit Plan Continue with strength/balance    PT Home Exercise Plan 07/26/2021=Access Code: [email or web address]; no updates    Consulted and Agree with Plan of Care Patient             Patient will benefit from skilled therapeutic intervention in order to improve the following deficits and impairments:     Visit Diagnosis: No diagnosis found.     Problem List Patient Active Problem List   Diagnosis Date Noted   Lumbar disc disorder with myelopathy 06/01/2021   Unsteady gait 05/27/2021   Fall at home, initial encounter 05/27/2021   Lower extremity weakness 05/26/2021   Obesity (BMI 35.0-39.9 without comorbidity) 05/26/2021   Anxiety 09/12/2017   Bipolar disorder ([place]) 09/12/2017   Insulin dependent diabetes mellitus 09/12/2017   Primary insomnia 09/12/2017   Pure hypercholesterolemia 09/12/2017   Recurrent major depressive disorder, in partial remission ([place]) 09/12/2017   Tobacco dependence 09/12/2017   Vitamin D deficiency 09/12/2017   DM type 2 with diabetic peripheral neuropathy ([place]) 04/06/2016   Hypercalcemia 09/13/2015   Hypogonadism in male 09/13/2015   Non morbid obesity due to excess calories 09/13/2015   7:57 AM, 02/06/22   Particia Lather, PT 02/06/2022, 7:57 AM  [place] MAIN [facility] SERVICES [address] [place], [place], [address] Phone: [phone]   Fax:  [phone]  Name: Lynx Goodrich MRN: [id number] Date of Birth: 23-May-1976

## 2022-02-06 NOTE — Telephone Encounter (Signed)
Pt contacted via telephone and author left voice mail informing of missed appointment and informed pt of future PT appointment date and time.   Tavonna Worthington PT, DPT   

## 2022-02-08 ENCOUNTER — Ambulatory Visit: Payer: Medicare Other

## 2022-02-13 ENCOUNTER — Ambulatory Visit: Payer: Medicare Other | Admitting: Physical Therapy

## 2022-02-15 ENCOUNTER — Ambulatory Visit: Payer: Medicare Other | Admitting: Physical Therapy

## 2022-02-20 ENCOUNTER — Ambulatory Visit: Payer: Medicare Other | Admitting: Physical Therapy

## 2022-02-22 ENCOUNTER — Ambulatory Visit: Payer: Medicare Other | Admitting: Physical Therapy

## 2022-02-27 ENCOUNTER — Ambulatory Visit: Payer: Medicare Other | Admitting: Physical Therapy

## 2022-03-01 ENCOUNTER — Ambulatory Visit: Payer: Medicare Other | Admitting: Physical Therapy

## 2022-03-06 ENCOUNTER — Ambulatory Visit: Payer: Medicare Other | Admitting: Physical Therapy

## 2022-03-08 ENCOUNTER — Ambulatory Visit: Payer: Medicare Other | Admitting: Physical Therapy

## 2022-03-13 ENCOUNTER — Ambulatory Visit: Payer: Medicare Other | Admitting: Physical Therapy

## 2022-03-15 ENCOUNTER — Ambulatory Visit: Payer: Medicare Other | Admitting: Physical Therapy

## 2022-03-20 ENCOUNTER — Ambulatory Visit: Payer: Medicare Other | Admitting: Physical Therapy

## 2022-03-22 ENCOUNTER — Ambulatory Visit: Payer: Medicare Other | Admitting: Physical Therapy

## 2022-03-27 ENCOUNTER — Ambulatory Visit: Payer: Medicare Other | Admitting: Physical Therapy

## 2022-03-29 ENCOUNTER — Ambulatory Visit: Payer: Medicare Other | Admitting: Physical Therapy

## 2022-04-03 ENCOUNTER — Ambulatory Visit: Payer: Medicare Other | Admitting: Physical Therapy

## 2022-04-05 ENCOUNTER — Ambulatory Visit: Payer: Medicare Other | Admitting: Physical Therapy

## 2022-04-10 ENCOUNTER — Ambulatory Visit: Payer: Medicare Other | Admitting: Physical Therapy

## 2022-04-12 ENCOUNTER — Ambulatory Visit: Payer: Medicare Other | Admitting: Physical Therapy

## 2022-04-17 ENCOUNTER — Ambulatory Visit: Payer: Medicare Other | Admitting: Physical Therapy

## 2022-04-19 ENCOUNTER — Ambulatory Visit: Payer: Medicare Other | Admitting: Physical Therapy

## 2022-04-24 ENCOUNTER — Ambulatory Visit: Payer: Medicare Other | Admitting: Physical Therapy

## 2022-04-26 ENCOUNTER — Ambulatory Visit: Payer: Medicare Other | Admitting: Physical Therapy

## 2022-05-01 ENCOUNTER — Ambulatory Visit: Payer: Medicare Other | Admitting: Physical Therapy

## 2022-05-03 ENCOUNTER — Ambulatory Visit: Payer: Medicare Other | Admitting: Physical Therapy

## 2022-05-08 ENCOUNTER — Ambulatory Visit: Payer: Medicare Other | Admitting: Physical Therapy

## 2022-05-15 ENCOUNTER — Ambulatory Visit: Payer: Medicare Other | Admitting: Physical Therapy

## 2022-05-17 ENCOUNTER — Ambulatory Visit: Payer: Medicare Other | Admitting: Physical Therapy

## 2022-05-22 ENCOUNTER — Ambulatory Visit: Payer: Medicare Other

## 2022-05-24 ENCOUNTER — Ambulatory Visit: Payer: Medicare Other

## 2022-05-29 ENCOUNTER — Ambulatory Visit: Payer: Medicare Other

## 2022-05-31 ENCOUNTER — Ambulatory Visit: Payer: Medicare Other

## 2022-06-05 ENCOUNTER — Ambulatory Visit: Payer: Medicare Other

## 2022-06-07 ENCOUNTER — Ambulatory Visit: Payer: Medicare Other

## 2022-06-12 ENCOUNTER — Ambulatory Visit: Payer: Medicare Other

## 2022-06-14 ENCOUNTER — Ambulatory Visit: Payer: Medicare Other
